# Patient Record
Sex: Female | Born: 1970 | Race: Black or African American | Hispanic: No | Marital: Married | Smoking: Never smoker
Health system: Southern US, Community
[De-identification: ages and names within clinical notes are randomized; demographics above are authoritative.]

## PROBLEM LIST (undated history)

## (undated) DIAGNOSIS — R768 Other specified abnormal immunological findings in serum: Secondary | ICD-10-CM

## (undated) DIAGNOSIS — M2550 Pain in unspecified joint: Secondary | ICD-10-CM

## (undated) DIAGNOSIS — I7300 Raynaud's syndrome without gangrene: Secondary | ICD-10-CM

## (undated) DIAGNOSIS — F988 Other specified behavioral and emotional disorders with onset usually occurring in childhood and adolescence: Secondary | ICD-10-CM

## (undated) DIAGNOSIS — L719 Rosacea, unspecified: Secondary | ICD-10-CM

## (undated) DIAGNOSIS — Z86718 Personal history of other venous thrombosis and embolism: Secondary | ICD-10-CM

## (undated) DIAGNOSIS — D249 Benign neoplasm of unspecified breast: Secondary | ICD-10-CM

## (undated) DIAGNOSIS — F32A Depression, unspecified: Secondary | ICD-10-CM

## (undated) DIAGNOSIS — M549 Dorsalgia, unspecified: Secondary | ICD-10-CM

## (undated) DIAGNOSIS — M797 Fibromyalgia: Secondary | ICD-10-CM

## (undated) DIAGNOSIS — D689 Coagulation defect, unspecified: Secondary | ICD-10-CM

## (undated) DIAGNOSIS — E559 Vitamin D deficiency, unspecified: Secondary | ICD-10-CM

## (undated) DIAGNOSIS — D6861 Antiphospholipid syndrome: Secondary | ICD-10-CM

## (undated) DIAGNOSIS — Z8619 Personal history of other infectious and parasitic diseases: Secondary | ICD-10-CM

## (undated) DIAGNOSIS — O99119 Other diseases of the blood and blood-forming organs and certain disorders involving the immune mechanism complicating pregnancy, unspecified trimester: Secondary | ICD-10-CM

## (undated) DIAGNOSIS — F329 Major depressive disorder, single episode, unspecified: Secondary | ICD-10-CM

## (undated) DIAGNOSIS — R5383 Other fatigue: Secondary | ICD-10-CM

## (undated) DIAGNOSIS — N870 Mild cervical dysplasia: Secondary | ICD-10-CM

## (undated) DIAGNOSIS — K219 Gastro-esophageal reflux disease without esophagitis: Secondary | ICD-10-CM

## (undated) HISTORY — DX: Mild cervical dysplasia: N87.0

## (undated) HISTORY — PX: GYNECOLOGIC CRYOSURGERY: SHX857

## (undated) HISTORY — PX: OTHER SURGICAL HISTORY: SHX169

## (undated) HISTORY — DX: Rosacea, unspecified: L71.9

## (undated) HISTORY — DX: Vitamin D deficiency, unspecified: E55.9

## (undated) HISTORY — DX: Personal history of other infectious and parasitic diseases: Z86.19

## (undated) HISTORY — DX: Raynaud's syndrome without gangrene: I73.00

## (undated) HISTORY — DX: Coagulation defect, unspecified: D68.9

## (undated) HISTORY — DX: Depression, unspecified: F32.A

## (undated) HISTORY — DX: Benign neoplasm of unspecified breast: D24.9

## (undated) HISTORY — DX: Dorsalgia, unspecified: M54.9

## (undated) HISTORY — DX: Other fatigue: R53.83

## (undated) HISTORY — DX: Other diseases of the blood and blood-forming organs and certain disorders involving the immune mechanism complicating pregnancy, unspecified trimester: O99.119

## (undated) HISTORY — DX: Other specified abnormal immunological findings in serum: R76.8

## (undated) HISTORY — DX: Personal history of other venous thrombosis and embolism: Z86.718

## (undated) HISTORY — DX: Gastro-esophageal reflux disease without esophagitis: K21.9

## (undated) HISTORY — DX: Major depressive disorder, single episode, unspecified: F32.9

## (undated) HISTORY — DX: Other specified behavioral and emotional disorders with onset usually occurring in childhood and adolescence: F98.8

## (undated) HISTORY — DX: Pain in unspecified joint: M25.50

## (undated) HISTORY — DX: Fibromyalgia: M79.7

## (undated) HISTORY — DX: Antiphospholipid syndrome: D68.61

---

## 1998-11-12 ENCOUNTER — Other Ambulatory Visit: Admission: RE | Admit: 1998-11-12 | Discharge: 1998-11-12 | Payer: Self-pay | Admitting: *Deleted

## 1999-02-05 ENCOUNTER — Inpatient Hospital Stay (HOSPITAL_COMMUNITY): Admission: AD | Admit: 1999-02-05 | Discharge: 1999-02-05 | Payer: Self-pay | Admitting: Obstetrics & Gynecology

## 1999-09-16 ENCOUNTER — Encounter (INDEPENDENT_AMBULATORY_CARE_PROVIDER_SITE_OTHER): Payer: Self-pay | Admitting: Specialist

## 1999-09-16 ENCOUNTER — Ambulatory Visit (HOSPITAL_COMMUNITY): Admission: RE | Admit: 1999-09-16 | Discharge: 1999-09-16 | Payer: Self-pay | Admitting: Surgery

## 2000-08-01 ENCOUNTER — Other Ambulatory Visit: Admission: RE | Admit: 2000-08-01 | Discharge: 2000-08-01 | Payer: Self-pay | Admitting: Obstetrics & Gynecology

## 2002-07-16 ENCOUNTER — Other Ambulatory Visit: Admission: RE | Admit: 2002-07-16 | Discharge: 2002-07-16 | Payer: Self-pay | Admitting: Obstetrics and Gynecology

## 2004-01-13 ENCOUNTER — Other Ambulatory Visit: Admission: RE | Admit: 2004-01-13 | Discharge: 2004-01-13 | Payer: Self-pay | Admitting: Obstetrics and Gynecology

## 2005-08-06 ENCOUNTER — Inpatient Hospital Stay (HOSPITAL_COMMUNITY): Admission: AD | Admit: 2005-08-06 | Discharge: 2005-08-06 | Payer: Self-pay | Admitting: *Deleted

## 2005-08-09 ENCOUNTER — Inpatient Hospital Stay (HOSPITAL_COMMUNITY): Admission: AD | Admit: 2005-08-09 | Discharge: 2005-08-09 | Payer: Self-pay | Admitting: Obstetrics and Gynecology

## 2005-08-15 ENCOUNTER — Ambulatory Visit (HOSPITAL_COMMUNITY): Admission: RE | Admit: 2005-08-15 | Discharge: 2005-08-15 | Payer: Self-pay | Admitting: Obstetrics and Gynecology

## 2005-08-15 ENCOUNTER — Encounter (INDEPENDENT_AMBULATORY_CARE_PROVIDER_SITE_OTHER): Payer: Self-pay | Admitting: *Deleted

## 2006-04-10 ENCOUNTER — Ambulatory Visit (HOSPITAL_COMMUNITY): Admission: RE | Admit: 2006-04-10 | Discharge: 2006-04-10 | Payer: Self-pay | Admitting: Family Medicine

## 2006-04-10 ENCOUNTER — Encounter: Payer: Self-pay | Admitting: Vascular Surgery

## 2006-05-21 ENCOUNTER — Other Ambulatory Visit: Admission: RE | Admit: 2006-05-21 | Discharge: 2006-05-21 | Payer: Self-pay | Admitting: Gynecology

## 2007-09-11 ENCOUNTER — Other Ambulatory Visit: Admission: RE | Admit: 2007-09-11 | Discharge: 2007-09-11 | Payer: Self-pay | Admitting: Gynecology

## 2007-09-26 DIAGNOSIS — N870 Mild cervical dysplasia: Secondary | ICD-10-CM

## 2007-09-26 HISTORY — DX: Mild cervical dysplasia: N87.0

## 2008-01-31 ENCOUNTER — Other Ambulatory Visit: Admission: RE | Admit: 2008-01-31 | Discharge: 2008-01-31 | Payer: Self-pay | Admitting: Gynecology

## 2008-05-22 ENCOUNTER — Ambulatory Visit (HOSPITAL_BASED_OUTPATIENT_CLINIC_OR_DEPARTMENT_OTHER): Admission: RE | Admit: 2008-05-22 | Discharge: 2008-05-22 | Payer: Self-pay | Admitting: Obstetrics and Gynecology

## 2008-05-22 ENCOUNTER — Encounter: Payer: Self-pay | Admitting: Obstetrics and Gynecology

## 2008-12-28 ENCOUNTER — Ambulatory Visit: Payer: Self-pay | Admitting: Obstetrics and Gynecology

## 2008-12-31 ENCOUNTER — Encounter: Payer: Self-pay | Admitting: Obstetrics and Gynecology

## 2008-12-31 ENCOUNTER — Other Ambulatory Visit: Admission: RE | Admit: 2008-12-31 | Discharge: 2008-12-31 | Payer: Self-pay | Admitting: Obstetrics and Gynecology

## 2008-12-31 ENCOUNTER — Ambulatory Visit: Payer: Self-pay | Admitting: Obstetrics and Gynecology

## 2009-01-15 ENCOUNTER — Ambulatory Visit: Payer: Self-pay | Admitting: Obstetrics and Gynecology

## 2009-05-18 ENCOUNTER — Ambulatory Visit: Payer: Self-pay | Admitting: Obstetrics and Gynecology

## 2009-07-23 ENCOUNTER — Ambulatory Visit: Payer: Self-pay | Admitting: Obstetrics and Gynecology

## 2009-08-02 ENCOUNTER — Ambulatory Visit: Payer: Self-pay | Admitting: Obstetrics and Gynecology

## 2009-08-16 ENCOUNTER — Ambulatory Visit: Payer: Self-pay | Admitting: Obstetrics and Gynecology

## 2009-08-27 ENCOUNTER — Ambulatory Visit: Payer: Self-pay | Admitting: Obstetrics and Gynecology

## 2009-09-18 ENCOUNTER — Ambulatory Visit: Payer: Self-pay | Admitting: Obstetrics and Gynecology

## 2009-09-28 ENCOUNTER — Ambulatory Visit: Payer: Self-pay | Admitting: Obstetrics and Gynecology

## 2009-10-07 ENCOUNTER — Ambulatory Visit: Payer: Self-pay | Admitting: Obstetrics and Gynecology

## 2009-10-07 ENCOUNTER — Ambulatory Visit (HOSPITAL_COMMUNITY): Admission: RE | Admit: 2009-10-07 | Discharge: 2009-10-07 | Payer: Self-pay | Admitting: Obstetrics and Gynecology

## 2009-11-06 ENCOUNTER — Ambulatory Visit: Payer: Self-pay | Admitting: Obstetrics and Gynecology

## 2009-11-09 ENCOUNTER — Ambulatory Visit: Payer: Self-pay | Admitting: Obstetrics and Gynecology

## 2009-12-30 ENCOUNTER — Ambulatory Visit: Payer: Self-pay | Admitting: Obstetrics and Gynecology

## 2010-01-02 ENCOUNTER — Ambulatory Visit: Payer: Self-pay | Admitting: Obstetrics and Gynecology

## 2010-02-15 ENCOUNTER — Ambulatory Visit: Payer: Self-pay | Admitting: Obstetrics and Gynecology

## 2010-02-17 ENCOUNTER — Ambulatory Visit: Payer: Self-pay | Admitting: Obstetrics and Gynecology

## 2010-02-24 ENCOUNTER — Ambulatory Visit: Payer: Self-pay | Admitting: Obstetrics and Gynecology

## 2010-02-24 ENCOUNTER — Other Ambulatory Visit: Admission: RE | Admit: 2010-02-24 | Discharge: 2010-02-24 | Payer: Self-pay | Admitting: Obstetrics and Gynecology

## 2010-03-02 ENCOUNTER — Ambulatory Visit: Payer: Self-pay | Admitting: Obstetrics and Gynecology

## 2010-03-04 ENCOUNTER — Ambulatory Visit (HOSPITAL_COMMUNITY): Admission: RE | Admit: 2010-03-04 | Discharge: 2010-03-04 | Payer: Self-pay | Admitting: Obstetrics and Gynecology

## 2010-03-04 ENCOUNTER — Ambulatory Visit: Payer: Self-pay | Admitting: Obstetrics and Gynecology

## 2010-03-08 ENCOUNTER — Ambulatory Visit: Payer: Self-pay | Admitting: Obstetrics and Gynecology

## 2010-10-12 ENCOUNTER — Inpatient Hospital Stay (HOSPITAL_COMMUNITY): Admission: RE | Admit: 2010-10-12 | Discharge: 2010-10-14 | Payer: Self-pay | Admitting: Obstetrics and Gynecology

## 2010-10-14 ENCOUNTER — Encounter
Admission: RE | Admit: 2010-10-14 | Discharge: 2010-11-13 | Payer: Self-pay | Source: Home / Self Care | Admitting: Obstetrics and Gynecology

## 2010-10-18 ENCOUNTER — Ambulatory Visit: Admission: RE | Admit: 2010-10-18 | Discharge: 2010-10-18 | Payer: Self-pay | Admitting: Obstetrics and Gynecology

## 2010-10-20 ENCOUNTER — Ambulatory Visit: Payer: Self-pay | Admitting: Oncology

## 2010-10-26 LAB — CBC WITH DIFFERENTIAL/PLATELET
Basophils Absolute: 0 10*3/uL (ref 0.0–0.1)
EOS%: 0.4 % (ref 0.0–7.0)
HCT: 46.6 % (ref 34.8–46.6)
MONO#: 0.5 10*3/uL (ref 0.1–0.9)
RBC: 5 10*6/uL (ref 3.70–5.45)
RDW: 14.2 % (ref 11.2–14.5)

## 2010-10-28 LAB — LUPUS ANTICOAGULANT PANEL
DRVVT: 46.9 secs — ABNORMAL HIGH (ref 36.2–44.3)
Lupus Anticoagulant: DETECTED — AB
PTT Lupus Anticoagulant: 52.1 secs — ABNORMAL HIGH (ref 30.0–45.6)
PTTLA Confirmation: 14.2 secs — ABNORMAL HIGH (ref <8.0)

## 2010-10-28 LAB — BETA-2 GLYCOPROTEIN ANTIBODIES
Beta-2-Glycoprotein I IgA: 6 A Units (ref <20)
Beta-2-Glycoprotein I IgM: 7 M Units (ref <20)

## 2010-10-28 LAB — COMPREHENSIVE METABOLIC PANEL
Albumin: 3.8 g/dL (ref 3.5–5.2)
CO2: 25 mEq/L (ref 19–32)
Calcium: 9.1 mg/dL (ref 8.4–10.5)
Chloride: 105 mEq/L (ref 96–112)
Creatinine, Ser: 0.86 mg/dL (ref 0.40–1.20)
Glucose, Bld: 80 mg/dL (ref 70–99)
Potassium: 4.2 mEq/L (ref 3.5–5.3)
Sodium: 140 mEq/L (ref 135–145)
Total Bilirubin: 1.1 mg/dL (ref 0.3–1.2)

## 2010-10-28 LAB — CARDIOLIPIN ANTIBODIES, IGG, IGM, IGA: Anticardiolipin IgM: 34 MPL U/mL — ABNORMAL HIGH (ref <11)

## 2010-11-14 ENCOUNTER — Encounter
Admission: RE | Admit: 2010-11-14 | Discharge: 2010-12-08 | Payer: Self-pay | Source: Home / Self Care | Attending: Obstetrics and Gynecology | Admitting: Obstetrics and Gynecology

## 2011-01-01 ENCOUNTER — Encounter: Payer: Self-pay | Admitting: Obstetrics and Gynecology

## 2011-01-05 ENCOUNTER — Ambulatory Visit: Payer: Self-pay | Admitting: Oncology

## 2011-02-15 ENCOUNTER — Encounter (HOSPITAL_BASED_OUTPATIENT_CLINIC_OR_DEPARTMENT_OTHER): Payer: 59 | Admitting: Oncology

## 2011-02-15 DIAGNOSIS — I7300 Raynaud's syndrome without gangrene: Secondary | ICD-10-CM

## 2011-02-15 DIAGNOSIS — D6859 Other primary thrombophilia: Secondary | ICD-10-CM

## 2011-02-15 DIAGNOSIS — Z7901 Long term (current) use of anticoagulants: Secondary | ICD-10-CM

## 2011-02-21 LAB — CBC
HCT: 32.2 % — ABNORMAL LOW (ref 36.0–46.0)
HCT: 34.6 % — ABNORMAL LOW (ref 36.0–46.0)
Hemoglobin: 11.8 g/dL — ABNORMAL LOW (ref 12.0–15.0)
MCH: 33.9 pg (ref 26.0–34.0)
MCHC: 34.8 g/dL (ref 30.0–36.0)
MCV: 98.9 fL (ref 78.0–100.0)
MCV: 99.3 fL (ref 78.0–100.0)
RDW: 14 % (ref 11.5–15.5)

## 2011-04-25 NOTE — Op Note (Signed)
NAMELORRIANN, HANSMANN                ACCOUNT NO.:  [id number]   MEDICAL RECORD NO.:  [id number]          PATIENT TYPE:  AMB   LOCATION:  [facility]                         FACILITY:  [facility]   PHYSICIAN:  Daniel L. Gottsegen, M.D.DATE OF BIRTH:  Aug 15, 1971   DATE OF PROCEDURE:  05/22/2008  DATE OF DISCHARGE:                               OPERATIVE REPORT   PREOPERATIVE DIAGNOSIS:  Missed abortion.   POSTOPERATIVE DIAGNOSIS:  Missed abortion   OPERATION:  Suction curettage.   ANESTHESIA:  General.   SURGEON:  Daniel L. Eda Paschal, M.D.   INDICATIONS:  The patient is a 40 year old gravida 6, para 2, AB 3 who  had presented to the office and at 6 weeks and 3 days had an ultrasound  showing a normal viable intrauterine pregnancy.  She, however, returned  to the office this week for follow-up ultrasound and at this point the  pregnancy had stopped growing.  It had actually gotten smaller and it  was no longer viable without fetal heart activity.  As a result of this  she enters the hospital now for missed abortion suction curettage, as  well as for chromosome studies on the fetal tissue.   FINDINGS:  External is normal.  BUS is normal.  Vaginal is normal.  Cervix is clean.  Uterus is enlarged about 9 weeks by an intrauterine  pregnancy.  Adnexa failed to reveal masses.  At the time of suction  curettage there was evidence of an intrauterine pregnancy.   PROCEDURE:  After adequate general anesthesia the patient was placed in  the dorsal lithotomy position.  She was prepped and draped in the usual  sterile manner.  The cervix was grasped with a single-tooth tenaculum  and dilated to #31 Pratt dilator.  A suction curettage was then done  with a #9 curette with removal of the intrauterine pregnancy which was  nonviable.  A representative piece of tissue which appeared to be fetal  was separated from the specimen and was sent for chromosome.  The rest  of the specimen was sent to pathology.   There was no significant  bleeding afterwards.  The uterus contracted well.  The patient left the  operating room in satisfactory condition.      Daniel L. Eda Paschal, M.D.  Electronically Signed     DLG/MEDQ  D:  05/22/2008  T:  05/22/2008  Job:  [id number]

## 2011-04-28 NOTE — Op Note (Signed)
NAMELAIBA, FUERTE              ACCOUNT NO.:  [id number]   MEDICAL RECORD NO.:  [id number]          PATIENT TYPE:  AMB   LOCATION:  [facility]                           FACILITY:  [facility]   PHYSICIAN:  Osborn Coho, M.D.   DATE OF BIRTH:  1971/01/21   DATE OF PROCEDURE:  08/15/2005  DATE OF DISCHARGE:                                 OPERATIVE REPORT   PREOPERATIVE DIAGNOSIS:  Ectopic versus abnormal intrauterine pregnancy.   POSTOPERATIVE DIAGNOSIS:  Ectopic versus abnormal intrauterine pregnancy.   PROCEDURE:  Suction dilation and curettage.   ATTENDING:  Osborn Coho, M.D.   FLUIDS:  1000 mL.   URINE OUTPUT:  Approximately 200 mL via straight cath prior to procedure.   ESTIMATED BLOOD LOSS:  Minimal.   ANESTHESIA:  MAC.   SPECIMENS TO PATHOLOGY:  Probable POC's.   COMPLICATIONS:  None.   FINDINGS:  Uterus sounded to 8 cm.   DESCRIPTION OF PROCEDURE:  The patient was taken to the operating room after  the risks, benefits and alternatives were reviewed with the patient. The  patient verbalized understanding and consent signed and witnessed. The  patient was placed under MAC per anesthesia and prepped and draped in the  normal sterile fashion in the dorsal lithotomy position. A bivalve speculum  was placed in the patient's vagina and the anterior lip of the cervix  grasped with a single-tooth tenaculum. A paracervical block was administered  using a total of 10 mL of 2% lidocaine. The cervix was dilated for passage  of a size 7 suction curette. Suction curettage was performed and products  sent to pathology. Sharp curettage was then performed until a gritty texture  was noted. Suction curettage was performed once again to remove any  remaining debris. Count was correct. All instruments were removed and there  was hemostasis at the tenaculum site. The patient tolerated the procedure  well and was transferred to the recovery room in good condition.     Osborn Coho, M.D.  Electronically Signed    AR/MEDQ  D:  08/15/2005  T:  08/15/2005  Job:  [id number]

## 2011-07-28 ENCOUNTER — Encounter: Payer: Self-pay | Admitting: *Deleted

## 2011-07-28 ENCOUNTER — Encounter: Payer: Self-pay | Admitting: Gynecology

## 2011-07-28 ENCOUNTER — Ambulatory Visit (INDEPENDENT_AMBULATORY_CARE_PROVIDER_SITE_OTHER): Payer: 59 | Admitting: Gynecology

## 2011-07-28 DIAGNOSIS — N870 Mild cervical dysplasia: Secondary | ICD-10-CM | POA: Insufficient documentation

## 2011-07-28 DIAGNOSIS — L719 Rosacea, unspecified: Secondary | ICD-10-CM | POA: Insufficient documentation

## 2011-07-28 DIAGNOSIS — M797 Fibromyalgia: Secondary | ICD-10-CM | POA: Insufficient documentation

## 2011-07-28 DIAGNOSIS — I7300 Raynaud's syndrome without gangrene: Secondary | ICD-10-CM | POA: Insufficient documentation

## 2011-07-28 DIAGNOSIS — N63 Unspecified lump in unspecified breast: Secondary | ICD-10-CM

## 2011-07-28 DIAGNOSIS — R768 Other specified abnormal immunological findings in serum: Secondary | ICD-10-CM | POA: Insufficient documentation

## 2011-07-28 NOTE — Progress Notes (Signed)
Patient presents complaining of a right breast mass over the past 3 weeks or so. It is nontender. 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 She is status post birth of her child 9 months ago and breast fed for 6 weeks. She is on Micronor oral contraceptives now. Exam Breasts: Examined lying and sitting. Left without masses retractions discharge adenopathy. Right without masses retractions discharge adenopathy. The area that the patient is pointing to is lateral to the scar from her fibroadenoma several fingerbreadths off the areola at the 10:00 position. I do not feel any abnormalities but normal underlying breast tissue. Assessment and plan: #1 Breast mass. Patient feels with she thinks is a new breast mass I do not appreciate the change. Recommended baseline mammogram diagnostic bilateral as she is turning 41 and right breast ultrasound over this area. We will then triaged based on these results.  Patient knows that my office will schedule the tests and if she does not hear from my office within the week she will call [place]. #2 Birth control. I reviewed the issues of Micronor progesterone only contraceptive.  The need to take them every day around the same time and the increased failure risk. The issues of breakthrough bleeding was also discussed. 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 The issues of increased thrombosis risk potentially with combination oral contraceptives was reviewed such as stroke heart attack DVT. I think she would be a good candidate for Mirena IUD I encouraged her to consider this. She will think of her options and followup when she's due for her annual several months to rediscuss with Dr. Eda Paschal or followup sooner if she decides she wants to proceed with the Mirena.

## 2011-08-08 ENCOUNTER — Ambulatory Visit
Admission: RE | Admit: 2011-08-08 | Discharge: 2011-08-08 | Disposition: A | Discharge status: Home / Self Care | Payer: 59 | Source: Ambulatory Visit | Attending: Gynecology | Admitting: Gynecology

## 2011-08-08 DIAGNOSIS — N63 Unspecified lump in unspecified breast: Secondary | ICD-10-CM

## 2011-09-07 LAB — POCT I-STAT 4, (NA,K, GLUC, HGB,HCT): Potassium: 3.4 — ABNORMAL LOW

## 2011-12-12 NOTE — L&D Delivery Note (Signed)
Delivery Note At 4:23 PM a viable and healthy female was delivered via  (Presentation: ;LOA  ).  APGAR: 9,9 ; weight pending .   Placenta status: spontaneous, intact.  Cord:  with the following complications: shoulder cord x one .  Cord pH: na  Anesthesia: Epidural  Episiotomy: none  Lacerations: none Suture Repair: none Est. Blood Loss (mL): 300  Mom to postpartum.  Baby to nursery-stable.  Aidyn Sportsman J 11/14/2012, 4:40 PM

## 2012-04-23 LAB — OB RESULTS CONSOLE GC/CHLAMYDIA
Chlamydia: NEGATIVE
Gonorrhea: NEGATIVE

## 2012-04-23 LAB — OB RESULTS CONSOLE RPR: RPR: NONREACTIVE

## 2012-04-23 LAB — OB RESULTS CONSOLE HEPATITIS B SURFACE ANTIGEN: Hepatitis B Surface Ag: NEGATIVE

## 2012-04-23 LAB — OB RESULTS CONSOLE RUBELLA ANTIBODY, IGM: Rubella: IMMUNE

## 2012-04-23 LAB — OB RESULTS CONSOLE HIV ANTIBODY (ROUTINE TESTING): HIV: NONREACTIVE

## 2012-04-23 LAB — OB RESULTS CONSOLE ANTIBODY SCREEN: Antibody Screen: NEGATIVE

## 2012-05-29 ENCOUNTER — Ambulatory Visit (INDEPENDENT_AMBULATORY_CARE_PROVIDER_SITE_OTHER): Payer: 59 | Admitting: Physician Assistant

## 2012-05-29 VITALS — BP 111/68 | HR 74 | Temp 97.8°F | Resp 16

## 2012-05-29 DIAGNOSIS — H109 Unspecified conjunctivitis: Secondary | ICD-10-CM

## 2012-05-29 MED ORDER — CIPROFLOXACIN HCL 0.3 % OP SOLN: 1.0000 [drp] | OPHTHALMIC | Status: AC

## 2012-05-29 NOTE — Progress Notes (Signed)
  Subjective:    Patient ID: Molly Sanchez, female    DOB: 07/30/1971, 41 y.o.   MRN: [id number]  HPI  Awoke this morning with redness and crusting of the right eye.  She describes the sensation of "trash" in her eye, but without any history of FB to the eye.  Does not wear corrective lenses.  Her toddler son has has bilateral eye redness for the past week, which she thought was allergy, so has been using allergy drops for him.  Green drainage reaccummulates after cleaning her eye.  She is pregnant.  Review of Systems As above. No fever, chills.  No URI-type symptoms.    Objective:   Physical Exam Vital signs noted. Well-developed, well nourished BF who is awake, alert and oriented, in NAD. HEENT: [place]/AT, PERRL, EOMI.  Sclera and conjunctiva are clear on the left, injected on the right. Fundi are normal.  Small amount of greenish drainage at the medial canthus of the right eye.  Neck: supple, non-tender, no lymphadenopathy, thyromegaly. Skin: warm and dry without rash.      Assessment & Plan:   1. Conjunctivitis  ciprofloxacin (CILOXAN) 0.3 % ophthalmic solution   Anticipatory guidance provided.  Advised on methods to reduce spread of infection to others in her family.

## 2012-05-29 NOTE — Patient Instructions (Signed)
Conjunctivitis Conjunctivitis is commonly called "pink eye." Conjunctivitis can be caused by bacterial or viral infection, allergies, or injuries. There is usually redness of the lining of the eye, itching, discomfort, and sometimes discharge. There may be deposits of matter along the eyelids. A viral infection usually causes a watery discharge, while a bacterial infection causes a yellowish, thick discharge. Pink eye is very contagious and spreads by direct contact. You may be given antibiotic eyedrops as part of your treatment. Before using your eye medicine, remove all drainage from the eye by washing gently with warm water and cotton balls. Continue to use the medication until you have awakened 2 mornings in a row without discharge from the eye. Do not rub your eye. This increases the irritation and helps spread infection. Use separate towels from other household members. Wash your hands with soap and water before and after touching your eyes. Use cold compresses to reduce pain and sunglasses to relieve irritation from light. Do not wear contact lenses or wear eye makeup until the infection is gone. SEEK MEDICAL CARE IF:   Your symptoms are not better after 3 days of treatment.   You have increased pain or trouble seeing.   The outer eyelids become very red or swollen.  Document Released: 01/04/2005 Document Revised: 11/16/2011 Document Reviewed: 11/27/2005 [employer] Patient Information 2012 [employer]. 

## 2012-07-31 ENCOUNTER — Ambulatory Visit (INDEPENDENT_AMBULATORY_CARE_PROVIDER_SITE_OTHER): Payer: 59 | Admitting: Physician Assistant

## 2012-07-31 ENCOUNTER — Encounter: Payer: Self-pay | Admitting: Physician Assistant

## 2012-07-31 VITALS — BP 110/60 | HR 90 | Temp 98.1°F | Resp 16 | Ht 67.0 in

## 2012-07-31 DIAGNOSIS — B350 Tinea barbae and tinea capitis: Secondary | ICD-10-CM

## 2012-07-31 LAB — POCT SKIN KOH: Skin KOH, POC: POSITIVE

## 2012-07-31 MED ORDER — CLOTRIMAZOLE-BETAMETHASONE 1-0.05 % EX CREA: TOPICAL_CREAM | Freq: Two times a day (BID) | CUTANEOUS | Status: DC

## 2012-07-31 NOTE — Progress Notes (Signed)
  Subjective:    Patient ID: Molly Sanchez, female    DOB: March 06, 1971, 41 y.o.   MRN: [id number]  HPI 41 yr old pregnant AAF presents with pruritic rash at base of scalp X 1 week.  Worsening.  No new soaps, creams, lotions, or creams.  No metal/nickel hair accessories. Healthy pregnancy so far.  Review of Systems  All other systems reviewed and are negative.      Objective:   Physical Exam  Nursing note and vitals reviewed. Constitutional: She is oriented to person, place, and time. She appears well-developed and well-nourished.  HENT:  Head: Normocephalic and atraumatic.  Cardiovascular: Normal rate, regular rhythm and normal heart sounds.   Pulmonary/Chest: Effort normal and breath sounds normal.  Neurological: She is alert and oriented to person, place, and time.  Skin: Skin is dry. Rash noted.       Base of neck and into lower hairline-1 cm round, well demarcated plaque with scale becoming confluent with other lesions that are spreading further up scalp. No noticeable hair loss at this time.    Results for orders placed in visit on 07/31/12  POCT SKIN KOH      Component Value Range   Skin KOH, POC Positive        Assessment & Plan:  Tinea capitus-unable to treat systemically due to pregnancy.  Use extra-strength selsun blue shampoo every 3rd day.  Use no-break hair accessories.  Start lotrisone cream bid.  Will treat 4 weeks instead of 2.  We will look at it again in 2 -3 weeks. (also seen and examined by Dr. Merla Riches and decided treatment together.)

## 2012-10-24 LAB — OB RESULTS CONSOLE GBS: GBS: POSITIVE

## 2012-11-01 ENCOUNTER — Inpatient Hospital Stay (HOSPITAL_COMMUNITY): Admission: AD | Admit: 2012-11-01 | Payer: Self-pay | Source: Ambulatory Visit | Admitting: Obstetrics and Gynecology

## 2012-11-01 ENCOUNTER — Other Ambulatory Visit: Payer: Self-pay | Admitting: Obstetrics and Gynecology

## 2012-11-04 ENCOUNTER — Encounter (HOSPITAL_COMMUNITY): Payer: Self-pay | Admitting: *Deleted

## 2012-11-04 ENCOUNTER — Telehealth (HOSPITAL_COMMUNITY): Payer: Self-pay | Admitting: *Deleted

## 2012-11-04 NOTE — Telephone Encounter (Signed)
Preadmission screen  

## 2012-11-14 ENCOUNTER — Encounter (HOSPITAL_COMMUNITY): Payer: Self-pay

## 2012-11-14 ENCOUNTER — Inpatient Hospital Stay (HOSPITAL_COMMUNITY): Payer: 59 | Admitting: Anesthesiology

## 2012-11-14 ENCOUNTER — Encounter (HOSPITAL_COMMUNITY): Payer: Self-pay | Admitting: Anesthesiology

## 2012-11-14 ENCOUNTER — Inpatient Hospital Stay (HOSPITAL_COMMUNITY)
Admission: RE | Admit: 2012-11-14 | Discharge: 2012-11-16 | DRG: 775 | Disposition: A | Discharge status: Home / Self Care | Payer: 59 | Source: Ambulatory Visit | Attending: Obstetrics and Gynecology | Admitting: Obstetrics and Gynecology

## 2012-11-14 DIAGNOSIS — D649 Anemia, unspecified: Secondary | ICD-10-CM | POA: Diagnosis not present

## 2012-11-14 DIAGNOSIS — O34219 Maternal care for unspecified type scar from previous cesarean delivery: Principal | ICD-10-CM | POA: Diagnosis present

## 2012-11-14 DIAGNOSIS — O09529 Supervision of elderly multigravida, unspecified trimester: Secondary | ICD-10-CM | POA: Diagnosis present

## 2012-11-14 DIAGNOSIS — O9903 Anemia complicating the puerperium: Secondary | ICD-10-CM | POA: Diagnosis not present

## 2012-11-14 DIAGNOSIS — O9912 Other diseases of the blood and blood-forming organs and certain disorders involving the immune mechanism complicating childbirth: Secondary | ICD-10-CM | POA: Diagnosis present

## 2012-11-14 DIAGNOSIS — D6861 Antiphospholipid syndrome: Secondary | ICD-10-CM

## 2012-11-14 DIAGNOSIS — D689 Coagulation defect, unspecified: Secondary | ICD-10-CM | POA: Diagnosis present

## 2012-11-14 DIAGNOSIS — O365990 Maternal care for other known or suspected poor fetal growth, unspecified trimester, not applicable or unspecified: Secondary | ICD-10-CM | POA: Diagnosis present

## 2012-11-14 HISTORY — DX: Antiphospholipid syndrome: D68.61

## 2012-11-14 LAB — CBC
HCT: 33.4 % — ABNORMAL LOW (ref 36.0–46.0)
MCH: 29.2 pg (ref 26.0–34.0)
MCHC: 33.5 g/dL (ref 30.0–36.0)
MCV: 87.2 fL (ref 78.0–100.0)
RDW: 14.5 % (ref 11.5–15.5)
WBC: 10.6 10*3/uL — ABNORMAL HIGH (ref 4.0–10.5)

## 2012-11-14 MED ORDER — LACTATED RINGERS IV SOLN: 500.0000 mL | Freq: Once | INTRAVENOUS | Status: DC

## 2012-11-14 MED ORDER — TERBUTALINE SULFATE 1 MG/ML IJ SOLN: 0.2500 mg | Freq: Once | INTRAMUSCULAR | Status: DC | PRN

## 2012-11-14 MED ORDER — EPHEDRINE 5 MG/ML INJ
10.0000 mg | INTRAVENOUS | Status: DC | PRN
  Filled 2012-11-14: qty 4

## 2012-11-14 MED ORDER — DIBUCAINE 1 % RE OINT: 1.0000 "application " | TOPICAL_OINTMENT | RECTAL | Status: DC | PRN

## 2012-11-14 MED ORDER — SODIUM BICARBONATE 8.4 % IV SOLN
INTRAVENOUS | Status: DC | PRN
  Administered 2012-11-14: 5 mL via EPIDURAL

## 2012-11-14 MED ORDER — PHENYLEPHRINE 40 MCG/ML (10ML) SYRINGE FOR IV PUSH (FOR BLOOD PRESSURE SUPPORT): 80.0000 ug | PREFILLED_SYRINGE | INTRAVENOUS | Status: DC | PRN

## 2012-11-14 MED ORDER — LANOLIN HYDROUS EX OINT: TOPICAL_OINTMENT | CUTANEOUS | Status: DC | PRN

## 2012-11-14 MED ORDER — CITRIC ACID-SODIUM CITRATE 334-500 MG/5ML PO SOLN: 30.0000 mL | ORAL | Status: DC | PRN

## 2012-11-14 MED ORDER — FENTANYL 2.5 MCG/ML BUPIVACAINE 1/10 % EPIDURAL INFUSION (WH - ANES)
14.0000 mL/h | INTRAMUSCULAR | Status: DC
  Administered 2012-11-14: 14 mL/h via EPIDURAL
  Filled 2012-11-14: qty 125

## 2012-11-14 MED ORDER — DEXTROSE 5 % IV SOLN
2.5000 10*6.[IU] | INTRAVENOUS | Status: DC
  Administered 2012-11-14: 2.5 10*6.[IU] via INTRAVENOUS
  Filled 2012-11-14 (×5): qty 2.5

## 2012-11-14 MED ORDER — ONDANSETRON HCL 4 MG PO TABS: 4.0000 mg | ORAL_TABLET | ORAL | Status: DC | PRN

## 2012-11-14 MED ORDER — METHYLERGONOVINE MALEATE 0.2 MG/ML IJ SOLN: 0.2000 mg | INTRAMUSCULAR | Status: DC | PRN

## 2012-11-14 MED ORDER — PRENATAL MULTIVITAMIN CH
1.0000 | ORAL_TABLET | Freq: Every day | ORAL | Status: DC
  Administered 2012-11-14 – 2012-11-16 (×3): 1 via ORAL
  Filled 2012-11-14 (×3): qty 1

## 2012-11-14 MED ORDER — ACETAMINOPHEN 325 MG PO TABS: 650.0000 mg | ORAL_TABLET | ORAL | Status: DC | PRN

## 2012-11-14 MED ORDER — FLEET ENEMA 7-19 GM/118ML RE ENEM: 1.0000 | ENEMA | RECTAL | Status: DC | PRN

## 2012-11-14 MED ORDER — PHENYLEPHRINE 40 MCG/ML (10ML) SYRINGE FOR IV PUSH (FOR BLOOD PRESSURE SUPPORT)
80.0000 ug | PREFILLED_SYRINGE | INTRAVENOUS | Status: DC | PRN
  Filled 2012-11-14: qty 5

## 2012-11-14 MED ORDER — TETANUS-DIPHTH-ACELL PERTUSSIS 5-2.5-18.5 LF-MCG/0.5 IM SUSP: 0.5000 mL | Freq: Once | INTRAMUSCULAR | Status: DC

## 2012-11-14 MED ORDER — DEXTROSE 5 % IV SOLN
5.0000 10*6.[IU] | Freq: Once | INTRAVENOUS | Status: AC
  Administered 2012-11-14: 5 10*6.[IU] via INTRAVENOUS
  Filled 2012-11-14: qty 5

## 2012-11-14 MED ORDER — ONDANSETRON HCL 4 MG/2ML IJ SOLN: 4.0000 mg | INTRAMUSCULAR | Status: DC | PRN

## 2012-11-14 MED ORDER — OXYCODONE-ACETAMINOPHEN 5-325 MG PO TABS
1.0000 | ORAL_TABLET | ORAL | Status: DC | PRN
  Administered 2012-11-15: 2 via ORAL
  Administered 2012-11-15 (×2): 1 via ORAL
  Filled 2012-11-14: qty 2
  Filled 2012-11-14 (×2): qty 1

## 2012-11-14 MED ORDER — SIMETHICONE 80 MG PO CHEW: 80.0000 mg | CHEWABLE_TABLET | ORAL | Status: DC | PRN

## 2012-11-14 MED ORDER — METHYLERGONOVINE MALEATE 0.2 MG PO TABS: 0.2000 mg | ORAL_TABLET | ORAL | Status: DC | PRN

## 2012-11-14 MED ORDER — OXYTOCIN 40 UNITS IN LACTATED RINGERS INFUSION - SIMPLE MED: 62.5000 mL/h | INTRAVENOUS | Status: DC

## 2012-11-14 MED ORDER — LACTATED RINGERS IV SOLN
INTRAVENOUS | Status: DC
  Administered 2012-11-14: 1000 mL via INTRAVENOUS
  Administered 2012-11-14: 950 mL via INTRAVENOUS

## 2012-11-14 MED ORDER — WITCH HAZEL-GLYCERIN EX PADS: 1.0000 "application " | MEDICATED_PAD | CUTANEOUS | Status: DC | PRN

## 2012-11-14 MED ORDER — IBUPROFEN 600 MG PO TABS
600.0000 mg | ORAL_TABLET | Freq: Four times a day (QID) | ORAL | Status: DC
  Administered 2012-11-14 – 2012-11-16 (×7): 600 mg via ORAL
  Filled 2012-11-14 (×6): qty 1

## 2012-11-14 MED ORDER — ZOLPIDEM TARTRATE 5 MG PO TABS: 5.0000 mg | ORAL_TABLET | Freq: Every evening | ORAL | Status: DC | PRN

## 2012-11-14 MED ORDER — SENNOSIDES-DOCUSATE SODIUM 8.6-50 MG PO TABS
2.0000 | ORAL_TABLET | Freq: Every day | ORAL | Status: DC
  Administered 2012-11-14: 2 via ORAL

## 2012-11-14 MED ORDER — DIPHENHYDRAMINE HCL 50 MG/ML IJ SOLN: 12.5000 mg | INTRAMUSCULAR | Status: DC | PRN

## 2012-11-14 MED ORDER — OXYCODONE-ACETAMINOPHEN 5-325 MG PO TABS: 1.0000 | ORAL_TABLET | ORAL | Status: DC | PRN

## 2012-11-14 MED ORDER — DIPHENHYDRAMINE HCL 25 MG PO CAPS: 25.0000 mg | ORAL_CAPSULE | Freq: Four times a day (QID) | ORAL | Status: DC | PRN

## 2012-11-14 MED ORDER — LACTATED RINGERS IV SOLN
500.0000 mL | INTRAVENOUS | Status: DC | PRN
  Administered 2012-11-14: 1000 mL via INTRAVENOUS

## 2012-11-14 MED ORDER — IBUPROFEN 600 MG PO TABS
600.0000 mg | ORAL_TABLET | Freq: Four times a day (QID) | ORAL | Status: DC | PRN
  Filled 2012-11-14: qty 1

## 2012-11-14 MED ORDER — OXYTOCIN BOLUS FROM INFUSION: 500.0000 mL | INTRAVENOUS | Status: DC

## 2012-11-14 MED ORDER — EPHEDRINE 5 MG/ML INJ: 10.0000 mg | INTRAVENOUS | Status: DC | PRN

## 2012-11-14 MED ORDER — ONDANSETRON HCL 4 MG/2ML IJ SOLN
4.0000 mg | Freq: Four times a day (QID) | INTRAMUSCULAR | Status: DC | PRN
  Administered 2012-11-14: 4 mg via INTRAVENOUS
  Filled 2012-11-14: qty 2

## 2012-11-14 MED ORDER — OXYTOCIN 40 UNITS IN LACTATED RINGERS INFUSION - SIMPLE MED
1.0000 m[IU]/min | INTRAVENOUS | Status: DC
  Administered 2012-11-14: 2 m[IU]/min via INTRAVENOUS
  Administered 2012-11-14: 600 m[IU]/min via INTRAVENOUS
  Filled 2012-11-14: qty 1000

## 2012-11-14 MED ORDER — BENZOCAINE-MENTHOL 20-0.5 % EX AERO
1.0000 "application " | INHALATION_SPRAY | CUTANEOUS | Status: DC | PRN
  Administered 2012-11-14: 1 via TOPICAL
  Filled 2012-11-14: qty 56

## 2012-11-14 MED ORDER — LIDOCAINE HCL (PF) 1 % IJ SOLN: 30.0000 mL | INTRAMUSCULAR | Status: DC | PRN

## 2012-11-14 NOTE — H&P (Signed)
Molly Sanchez, Molly Sanchez                ACCOUNT NO.:  [id number]  MEDICAL RECORD NO.:  [id number]  LOCATION:  9123                          FACILITY:  [facility]  PHYSICIAN:  Lenoard Aden, M.D.DATE OF BIRTH:  October 20, 1971  DATE OF ADMISSION:  11/14/2012 DATE OF DISCHARGE:                             HISTORY & PHYSICAL   CHIEF COMPLAINT:  A 38 weeks with history of IUGR and antiphospholipid antibody syndrome.  HISTORY OF PRESENT ILLNESS:  She is a 41 year old white African American female, G9, P3-1-4-3 with a history of C-section and successful VBAC x2, who presents with favorable cervix, induction of labor, and history of antiphospholipid antibody syndrome and IUGR for induction of labor at 38 weeks.  She has no known drug allergies.  MEDICATIONS:  Prenatal vitamins and Lovenox, which was discontinued on November 12, 2012.  SOCIAL HISTORY:  She has personal history of antiphospholipid antibody syndrome as noted.  FAMILY HISTORY:  Psychiatric disease and heart disease.  She has a previous history of a preterm birth at 22 weeks, a history of C-section at 41 weeks and a history of vaginal delivery of a 7 pounds and 11 ounces fetus and a 6 pounds fetus.  PHYSICAL EXAMINATION:  GENERAL:  She is a well-developed, well- nourished, African American female, in no acute distress. HEENT:  Normal. NECK:  Supple.  Full range of motion. LUNGS:  Clear. HEART:  Regular rhythm. ABDOMEN:  Soft, gravid, nontender.  Cervix is 2-3 cm, 70% vertex, -1. EXTREMITIES:  There are no cords. NEUROLOGIC:  Nonfocal. SKIN:  Intact.  IMPRESSION: 1. A 38 weeks intrauterine pregnancy. 2. History of intrauterine growth restriction. 3. Antiphospholipid antibody syndrome, previously on Lovenox. 4. Previous cesarean section was successful vaginal birth after     cesarean x2.  PLAN:  Proceed with induction of labor.  Risks and benefits were discussed.  Epidural as needed.     Lenoard Aden,  M.D.     RJT/MEDQ  D:  11/14/2012  T:  11/14/2012  Job:  [id number]

## 2012-11-14 NOTE — Anesthesia Procedure Notes (Signed)

## 2012-11-14 NOTE — Progress Notes (Signed)
Molly Sanchez is a 41 y.o. [id number] at [redacted]w[redacted]d by LMP admitted for induction of labor due to Antiphospholipid ab syndrome and presumed IUGR.  Subjective: Comfortable with epidural  Objective: BP 118/60  Pulse 67  Temp 98.2 F (36.8 C) (Oral)  Resp 20  Ht 5\' 7"  (1.702 m)  Wt 120.203 kg (265 lb)  BMI 41.50 kg/m2  SpO2 100%  LMP 02/22/2012      FHT:  FHR: 134 bpm, variability: moderate,  accelerations:  Present,  decelerations:  Absent UC:   irregular, IUPC placed without difficulty SVE:   Dilation: 5.5 Effacement (%): 80 Station: -2 Exam by:: Dr Billy Coast  Labs: Lab Results  Component Value Date   WBC 10.6* 11/14/2012   HGB 11.2* 11/14/2012   HCT 33.4* 11/14/2012   MCV 87.2 11/14/2012   PLT 164 11/14/2012    Assessment / Plan: Induction of labor due to APL AB syndrome,  progressing well on pitocin  Labor: Progressing normally Preeclampsia:  na Fetal Wellbeing:  Category I Pain Control:  Epidural I/D:  n/a Anticipated MOD:  NSVD  Berish Bohman J 11/14/2012, 3:03 PM

## 2012-11-14 NOTE — Progress Notes (Signed)
Molly Sanchez is a 41 y.o. [id number] at [redacted]w[redacted]d by LMP admitted for induction of labor due to Antiphospholipid Ab syndrome and IUGR.  Subjective: Comfortable  Objective: BP 107/59  Pulse 74  Temp 98.5 F (36.9 C) (Oral)  Resp 18  Ht 5\' 7"  (1.702 m)  Wt 120.203 kg (265 lb)  BMI 41.50 kg/m2  LMP 02/22/2012      FHT:  FHR: 155 bpm, variability: moderate,  accelerations:  Present,  decelerations:  Absent UC:   none SVE:    2/60/-1 AROM - clear Labs: CBC    Component Value Date/Time   WBC 10.6* 11/14/2012 0715   WBC 7.8 10/26/2010 1052   RBC 3.83* 11/14/2012 0715   RBC 5.00 10/26/2010 1052   HGB 11.2* 11/14/2012 0715   HGB 15.6 10/26/2010 1052   HCT 33.4* 11/14/2012 0715   HCT 46.6 10/26/2010 1052   PLT 164 11/14/2012 0715   PLT 220 10/26/2010 1052   MCV 87.2 11/14/2012 0715   MCV 93.3 10/26/2010 1052   MCH 29.2 11/14/2012 0715   MCH 31.3 10/26/2010 1052   MCHC 33.5 11/14/2012 0715   MCHC 33.5 10/26/2010 1052   RDW 14.5 11/14/2012 0715   RDW 14.2 10/26/2010 1052   LYMPHSABS 1.0 10/26/2010 1052   MONOABS 0.5 10/26/2010 1052   EOSABS 0.0 10/26/2010 1052   BASOSABS 0.0 10/26/2010 1052      Assessment / Plan: Induction of labor due to above indications,  progressing well on pitocin  Labor: Progressing on Pitocin, will continue to increase then AROM Preeclampsia:  na Fetal Wellbeing:  Category I Pain Control:  Labor support without medications I/D:  n/a Anticipated MOD:  NSVD  Molly Sanchez J 11/14/2012, 7:19 AM

## 2012-11-14 NOTE — Anesthesia Preprocedure Evaluation (Addendum)

## 2012-11-15 ENCOUNTER — Encounter (HOSPITAL_COMMUNITY): Payer: Self-pay

## 2012-11-15 DIAGNOSIS — D6861 Antiphospholipid syndrome: Secondary | ICD-10-CM

## 2012-11-15 HISTORY — DX: Antiphospholipid syndrome: D68.61

## 2012-11-15 LAB — CBC
HCT: 30.3 % — ABNORMAL LOW (ref 36.0–46.0)
MCH: 29.9 pg (ref 26.0–34.0)
MCHC: 34 g/dL (ref 30.0–36.0)
MCV: 87.8 fL (ref 78.0–100.0)
Platelets: 147 10*3/uL — ABNORMAL LOW (ref 150–400)
RDW: 14.4 % (ref 11.5–15.5)
WBC: 12.5 10*3/uL — ABNORMAL HIGH (ref 4.0–10.5)

## 2012-11-15 LAB — TYPE AND SCREEN
Antibody Screen: NEGATIVE
DAT, IgG: NEGATIVE

## 2012-11-15 MED ORDER — POLYSACCHARIDE IRON COMPLEX 150 MG PO CAPS
150.0000 mg | ORAL_CAPSULE | Freq: Every day | ORAL | Status: DC
  Administered 2012-11-15 – 2012-11-16 (×2): 150 mg via ORAL
  Filled 2012-11-15 (×3): qty 1

## 2012-11-15 MED ORDER — ENOXAPARIN SODIUM 40 MG/0.4ML ~~LOC~~ SOLN
40.0000 mg | Freq: Every day | SUBCUTANEOUS | Status: DC
  Administered 2012-11-15 – 2012-11-16 (×2): 40 mg via SUBCUTANEOUS
  Filled 2012-11-15 (×3): qty 0.4

## 2012-11-15 NOTE — Progress Notes (Addendum)
Post Partum Day 1 NSVD viable female s/p IOL at [redacted]w[redacted]d  as Hx.  ALP and Hx of IUGR in past pregnancy Subjective: no complaints, up ad lib without syncope, voiding, tolerating PO, + flatus, +BM  Pain well controlled with po meds, taking motrin and percocet  BF: on demand Mood stable, bonding well To recommence on Lovenox 40 mgs s/c daily.   Objective: Blood pressure 91/58, pulse 72, temperature 97.2 F (36.2 C), temperature source Oral, resp. rate 18, height 5\' 7"  (1.702 m), weight 120.203 kg (265 lb), last menstrual period 02/22/2012, SpO2 100.00%, unknown if currently breastfeeding. - breastfeeding  Physical Exam:  General: alert, cooperative and no distress Breasts: N/T Lungs: CTAB Heart: RRR Lochia: appropriate, Rubra, moderate Uterine Fundus: firm at U Perineum: Intact DVT Evaluation: No evidence of DVT seen on physical exam. Negative Homan's sign. No cords or calf tenderness. No significant calf/ankle edema.   Basename 11/15/12 0545 11/14/12 0715  HGB 10.3* 11.2*  HCT 30.3* 33.4*   Anemia of pregnancy delivered.  Assessment/Plan: Plan for discharge tomorrow To recommence on Lovenox 40 mgs s/c daily due to ALP, prophylaxis      LOS: 1 day   Danai Gotto, CNM. 11/15/2012, 10:05 AM

## 2012-11-15 NOTE — Anesthesia Postprocedure Evaluation (Signed)
  Anesthesia Post-op Note  Patient: Molly Sanchez  Procedure(s) Performed: * No procedures listed *  Patient Location: Mother/Baby  Anesthesia Type:Epidural  Level of Consciousness: awake, alert  and oriented  Airway and Oxygen Therapy: Patient Spontanous Breathing  Post-op Pain: mild  Post-op Assessment: Patient's Cardiovascular Status Stable, Respiratory Function Stable, Patent Airway, No signs of Nausea or vomiting and Pain level controlled  Post-op Vital Signs: stable  Complications: No apparent anesthesia complications

## 2012-11-16 MED ORDER — OXYCODONE-ACETAMINOPHEN 5-325 MG PO TABS: 1.0000 | ORAL_TABLET | Freq: Four times a day (QID) | ORAL | Status: DC | PRN

## 2012-11-16 MED ORDER — IBUPROFEN 600 MG PO TABS: 600.0000 mg | ORAL_TABLET | Freq: Four times a day (QID) | ORAL | Status: DC | PRN

## 2012-11-16 MED ORDER — POLYSACCHARIDE IRON COMPLEX 150 MG PO CAPS: 150.0000 mg | ORAL_CAPSULE | Freq: Every day | ORAL | Status: DC

## 2012-11-16 NOTE — Discharge Summary (Signed)
Physician Discharge Summary  Patient ID: CAMBELL RICKENBACH MRN: [id number] DOB/AGE: 05/27/1971 41 y.o.  Admit date: 11/14/2012 Discharge date: 11/16/2012  Admission Diagnoses:dmiytted for IOL due to Hx of Previous IUGR and APL Syndrome  Discharge Diagnoses:  Active Problems:  NSVD (normal spontaneous vaginal delivery)  Antiphospholipid antibody syndrome   Discharged Condition: stable  Hospital Course: uncomplicated  Consults: None  Significant Diagnostic Studies: radiology: anatomy normal   Treatments: IV hydration and analgesia: acetaminophen w/ codeine and ibuprofen  Discharge Exam: Blood pressure 96/60, pulse 63, temperature 97.6 F (36.4 C), temperature source Oral, resp. rate 20, height 5\' 7"  (1.702 m), weight 120.203 kg (265 lb), last menstrual period 02/22/2012, SpO2 98.00%, unknown if currently breastfeeding.  ROS: General appearance: alert, cooperative and no distress Affect: AAO x 3 Breasts: N/T Lungs: CTAB CV: RRR Abdomen: soft, N/T, BM and B/S x 4 GI: normal GU: no problems voiding Extremities: Bilaterally, no swelling, No edema    Disposition: discharge to home with baby  Discharge Orders    Future Orders Please Complete By Expires   Diet general      Discharge instructions      Comments:   Per [facility] Booklet       Medication List     As of 11/16/2012  9:14 AM    TAKE these medications         acetaminophen-codeine 300-30 MG per tablet   Commonly known as: TYLENOL #3   Take 1 tablet by mouth every 4 (four) hours as needed. For pain with tooth issue      enoxaparin 40 MG/0.4ML injection   Commonly known as: LOVENOX   Inject 40 mg into the skin daily.      ibuprofen 600 MG tablet   Commonly known as: ADVIL,MOTRIN   Take 1 tablet (600 mg total) by mouth every 6 (six) hours as needed for pain.      iron polysaccharides 150 MG capsule   Commonly known as: NIFEREX   Take 1 capsule (150 mg total) by mouth daily.      oxyCODONE-acetaminophen  5-325 MG per tablet   Commonly known as: PERCOCET/ROXICET   Take 1-2 tablets by mouth every 6 (six) hours as needed (moderate - severe pain).      prenatal multivitamin Tabs   Take 1 tablet by mouth daily.           Follow-up Information    Follow up with [facility] In 6 weeks. (As needed)    Contact information:   [address] [place] [address] [phone]        NSVD viable female - uncomplicated Signed: Valdez Brannan, CNM. 11/16/2012, 9:14 AM

## 2012-11-16 NOTE — Progress Notes (Signed)
Post Partum Day  NSVD viable female without complications. Patient has APL syndrome and now on prophylaxis with Lovenox 40 mg s/c daily for 6 - 12 weeks PP.   Subjective: no complaints, up ad lib without syncope, voiding, tolerating PO, + flatus, +BM  Pain well controlled with po meds, taking motrin and percocet  BF: on demand and coping well Mood stable, bonding well.  Objective: Blood pressure 96/60, pulse 63, temperature 97.6 F (36.4 C), temperature source Oral, resp. rate 20, height 5\' 7"  (1.702 m), weight 120.203 kg (265 lb), last menstrual period 02/22/2012, SpO2 98.00%, unknown if currently breastfeeding.  Physical Exam:  General: alert, appears stated age and no distress Breast: N./T Lungs: CTAB Heart: RRR Lochia: appropriate Uterine Fundus: firm Perineum: intact DVT Evaluation: No evidence of DVT seen on physical exam. Negative Homan's sign. No cords or calf tenderness. No significant calf/ankle edema.   Basename 11/15/12 0545 11/14/12 0715  HGB 10.3* 11.2*  HCT 30.3* 33.4*     Continues of po Iron supplement due to Anemia of Pregnancy .  Assessment/Plan: Discharge home.       LOS: 2 days   Rishon Thilges 11/16/2012, 8:39 AM

## 2012-11-22 ENCOUNTER — Telehealth: Payer: Self-pay

## 2012-11-22 NOTE — Telephone Encounter (Signed)
Error

## 2012-12-02 ENCOUNTER — Ambulatory Visit (HOSPITAL_COMMUNITY): Admission: RE | Admit: 2012-12-02 | Payer: 59 | Source: Ambulatory Visit

## 2012-12-05 ENCOUNTER — Encounter (HOSPITAL_COMMUNITY): Payer: Self-pay

## 2014-01-02 ENCOUNTER — Encounter: Payer: Self-pay | Admitting: Internal Medicine

## 2014-01-02 ENCOUNTER — Other Ambulatory Visit: Payer: Self-pay

## 2014-01-02 DIAGNOSIS — Z1231 Encounter for screening mammogram for malignant neoplasm of breast: Secondary | ICD-10-CM

## 2014-01-13 ENCOUNTER — Ambulatory Visit: Payer: 59

## 2014-01-13 ENCOUNTER — Other Ambulatory Visit: Payer: Self-pay | Admitting: Internal Medicine

## 2014-01-13 ENCOUNTER — Ambulatory Visit (INDEPENDENT_AMBULATORY_CARE_PROVIDER_SITE_OTHER): Payer: 59 | Admitting: Internal Medicine

## 2014-01-13 VITALS — BP 124/72 | HR 93 | Temp 98.0°F | Resp 16 | Ht 66.0 in

## 2014-01-13 DIAGNOSIS — R1013 Epigastric pain: Secondary | ICD-10-CM

## 2014-01-13 DIAGNOSIS — K224 Dyskinesia of esophagus: Secondary | ICD-10-CM

## 2014-01-13 LAB — COMPREHENSIVE METABOLIC PANEL
ALBUMIN: 4.3 g/dL (ref 3.5–5.2)
ALK PHOS: 66 U/L (ref 39–117)
ALT: <8 U/L (ref 0–35)
AST: 15 U/L (ref 0–37)
BILIRUBIN TOTAL: 1.3 mg/dL — AB (ref 0.2–1.2)
BUN: 12 mg/dL (ref 6–23)
CO2: 26 mEq/L (ref 19–32)
Calcium: 10 mg/dL (ref 8.4–10.5)
Chloride: 101 mEq/L (ref 96–112)
Creat: 0.62 mg/dL (ref 0.50–1.10)
Glucose, Bld: 79 mg/dL (ref 70–99)
POTASSIUM: 4.1 meq/L (ref 3.5–5.3)
SODIUM: 138 meq/L (ref 135–145)
TOTAL PROTEIN: 7.4 g/dL (ref 6.0–8.3)

## 2014-01-13 LAB — POCT UA - MICROSCOPIC ONLY
CASTS, UR, LPF, POC: NEGATIVE
Crystals, Ur, HPF, POC: NEGATIVE
YEAST UA: NEGATIVE

## 2014-01-13 LAB — POCT CBC
Granulocyte percent: 72.7 %G (ref 37–80)
HCT, POC: 45.8 % (ref 37.7–47.9)
HEMOGLOBIN: 14.5 g/dL (ref 12.2–16.2)
LYMPH, POC: 1.7 (ref 0.6–3.4)
MCH, POC: 30 pg (ref 27–31.2)
MCHC: 31.7 g/dL — AB (ref 31.8–35.4)
MCV: 94.9 fL (ref 80–97)
MID (CBC): 0.5 (ref 0–0.9)
MPV: 10.9 fL (ref 0–99.8)
PLATELET COUNT, POC: 249 10*3/uL (ref 142–424)
POC GRANULOCYTE: 6 (ref 2–6.9)
POC LYMPH PERCENT: 20.9 %L (ref 10–50)
POC MID %: 6.4 % (ref 0–12)
RBC: 4.83 M/uL (ref 4.04–5.48)
RDW, POC: 14 %
WBC: 8.3 10*3/uL (ref 4.6–10.2)

## 2014-01-13 LAB — POCT URINALYSIS DIPSTICK
GLUCOSE UA: NEGATIVE
Ketones, UA: 80
NITRITE UA: NEGATIVE
PROTEIN UA: 30
Spec Grav, UA: >=1.03
UROBILINOGEN UA: 0.2
pH, UA: 5.5

## 2014-01-13 LAB — LIPASE: Lipase: 13 U/L (ref 0–75)

## 2014-01-13 LAB — POCT SEDIMENTATION RATE: POCT SED RATE: 40 mm/h — AB (ref 0–22)

## 2014-01-13 MED ORDER — LORAZEPAM 1 MG PO TABS: 1.0000 mg | ORAL_TABLET | Freq: Two times a day (BID) | ORAL | Status: DC | PRN

## 2014-01-13 NOTE — Patient Instructions (Addendum)
Abdominal Pain, Adult Many things can cause abdominal pain. Usually, abdominal pain is not caused by a disease and will improve without treatment. It can often be observed and treated at home. Your health care provider will do a physical exam and possibly order blood tests and X-rays to help determine the seriousness of your pain. However, in many cases, more time must pass before a clear cause of the pain can be found. Before that point, your health care provider may not know if you need more testing or further treatment. HOME CARE INSTRUCTIONS  Monitor your abdominal pain for any changes. The following actions may help to alleviate any discomfort you are experiencing:  Only take over-the-counter or prescription medicines as directed by your health care provider.  Do not take laxatives unless directed to do so by your health care provider.  Try a clear liquid diet (broth, tea, or water) as directed by your health care provider. Slowly move to a bland diet as tolerated. SEEK MEDICAL CARE IF:  You have unexplained abdominal pain.  You have abdominal pain associated with nausea or diarrhea.  You have pain when you urinate or have a bowel movement.  You experience abdominal pain that wakes you in the night.  You have abdominal pain that is worsened or improved by eating food.  You have abdominal pain that is worsened with eating fatty foods. SEEK IMMEDIATE MEDICAL CARE IF:   Your pain does not go away within 2 hours.  You have a fever.  You keep throwing up (vomiting).  Your pain is felt only in portions of the abdomen, such as the right side or the left lower portion of the abdomen.  You pass bloody or black tarry stools. MAKE SURE YOU:  Understand these instructions.   Will watch your condition.   Will get help right away if you are not doing well or get worse.  Document Released: 09/06/2005 Document Revised: 09/17/2013 Document Reviewed: 08/06/2013 [facility] Patient  Information 2014 [place]. Atomoxetine capsules What is this medicine? ATOMOXETINE (AT oh mox e teen) is used to treat attention deficit/hyperactivity disorder, also known as ADHD. It is not a stimulant like other drugs for ADHD. This drug can improve attention span, concentration, and emotional control. It can also reduce restless or overactive behavior. This medicine may be used for other purposes; ask your health care provider or pharmacist if you have questions. COMMON BRAND NAME(S): Strattera What should I tell my health care provider before I take this medicine? They need to know if you have any of these conditions: -glaucoma -high or low blood pressure -history of stroke -irregular heartbeat or other cardiac disease -liver disease -mania or bipolar disorder -pheochromocytoma -suicidal thoughts -an unusual or allergic reaction to atomoxetine, other medicines, foods, dyes, or preservatives -pregnant or trying to get pregnant -breast-feeding How should I use this medicine? Take this medicine by mouth with a glass of water. Follow the directions on the prescription label. You can take it with or without food. If it upsets your stomach, take it with food. If you have difficulty sleeping and you take more than 1 dose per day, take your last dose before 6 PM. Take your medicine at regular intervals. Do not take it more often than directed. Do not stop taking except on your doctor's advice. A special MedGuide will be given to you by the pharmacist with each prescription and refill. Be sure to read this information carefully each time. Talk to your pediatrician regarding the use  of this medicine in children. While this drug may be prescribed for children as young as 6 years for selected conditions, precautions do apply. Overdosage: If you think you have taken too much of this medicine contact a poison control center or emergency room at once. NOTE: This medicine is only for you. Do not  share this medicine with others. What if I miss a dose? If you miss a dose, take it as soon as you can. If it is almost time for your next dose, take only that dose. Do not take double or extra doses. What may interact with this medicine? Do not take this medicine with any of the following medications: -MAOIs like Carbex, Eldepryl, Marplan, Nardil, and Parnate -reboxetine This medicine may also interact with the following medications: -albuterol -certain medicines for blood pressure, heart disease, irregular heart beat -certain medicines for depression, anxiety, or psychotic disturbances -cold or allergy medicines -isoproterenol -medicines that increase blood pressure like dopamine, dobutamine, or ephedrine -stimulant medicines for attention disorders, weight loss, or to stay awake -terbutaline This list may not describe all possible interactions. Give your health care provider a list of all the medicines, herbs, non-prescription drugs, or dietary supplements you use. Also tell them if you smoke, drink alcohol, or use illegal drugs. Some items may interact with your medicine. What should I watch for while using this medicine? It may take a week or more for this medicine to take effect. This is why it is very important to continue taking the medicine and not miss any doses. If you have been taking this medicine regularly for some time, do not suddenly stop taking it. Ask your doctor or health care professional for advice. Rarely, this medicine may increase thoughts of suicide or suicide attempts in children and teenagers. Call your child's health care professional right away if your child or teenager has new or increased thoughts of suicide or has changes in mood or behavior like becoming irritable or anxious. Regularly monitor your child for these behavioral changes. For males, contact you doctor or health care professional right away if you have an erection that lasts longer than 4 hours or if  it becomes painful. This may be a sign of serious problem and must be treated right away to prevent permanent damage. You may get drowsy or dizzy. Do not drive, use machinery, or do anything that needs mental alertness until you know how this medicine affects you. Do not stand or sit up quickly, especially if you are an older patient. This reduces the risk of dizzy or fainting spells. Alcohol can make you more drowsy and dizzy. Avoid alcoholic drinks. Do not treat yourself for coughs, colds or allergies without asking your doctor or health care professional for advice. Some ingredients can increase possible side effects. Your mouth may get dry. Chewing sugarless gum or sucking hard candy, and drinking plenty of water will help. What side effects may I notice from receiving this medicine? Side effects that you should report to your doctor or health care professional as soon as possible: -allergic reactions like skin rash, itching or hives, swelling of the face, lips, or tongue -breathing problems -chest pain -dark urine -fast, irregular heartbeat -general ill feeling or flu-like symptoms -high blood pressure -males: prolonged or painful erection -stomach pain or tenderness -trouble passing urine or change in the amount of urine -vomiting -weight loss -yellowing of the eyes or skin Side effects that usually do not require medical attention (report to your doctor or health  care professional if they continue or are bothersome): -change in sex drive or performance -constipation or diarrhea -headache -loss of appetite -menstrual period irregularities -nausea -stomach upset This list may not describe all possible side effects. Call your doctor for medical advice about side effects. You may report side effects to FDA at 1-800-FDA-1088. Where should I keep my medicine? Keep out of the reach of children. Store at room temperature between 15 and 30 degrees C (59 and 86 degrees F). Throw away any  unused medication after the expiration date. NOTE: This sheet is a summary. It may not cover all possible information. If you have questions about this medicine, talk to your doctor, pharmacist, or health care provider.  2014, [employer]/Gold Standard. (2013-08-18 14:33:10) Esophageal Spasm Esophageal spasm is an uncoordinated contraction of the muscles of the esophagus (the tube which carries food from your mouth to your stomach). Normally, the muscles of the esophagus alternate between contraction and relaxation starting from the top of the esophagus and working down to the bottom. This moves the food from the mouth to the stomach. In esophageal spasm, all the muscles contract at once. This causes pain and fails to move the food along. As a result, you may have trouble swallowing.  Women are more likely than men to have esophageal spasm. The cause of the spasms is not known. Sometimes eating hot or cold foods triggers the condition and this may be due to an overly sensitive esophagus. This is not an infectious disease and cannot be passed to others. SYMPTOMS  Symptoms of esophageal spasm may include: chest pain, burning or pain with swallowing, and difficulty swallowing.  DIAGNOSIS  Esophageal spasm can be diagnosed by a test called manometry (pressure studies of the esophagus). In this test, a special tube is inserted down the esophagus. The tube measures the muscle activity of the esophagus. Abnormal contractions mixed with normal movement helps confirm the diagnosis.  A person with a hypersensitive esophagus may be diagnosed by inflating a long balloon in the person's esophagus. If this causes the same symptoms, preventive methods may work. PREVENTION  Avoid hot or cold foods if that seems to be a trigger. PROGNOSIS  This condition does not go away, nor is treatment entirely satisfactory. Patients need to be careful of what they eat. They need to continue on medication if a useful one is found.  Fortunately, the condition does not get progressively worse as time passes. Esophageal spasm does not usually lead to more serious problems but sometimes the pain can be disabling. If a person becomes afraid to eat they may become malnourished and lose weight.  TREATMENT   A procedure in which instruments of increasing size are inserted through the esophagus to enlarge (dilate) it are used.  Medications that decrease acid-production of the stomach may be used such as proton-pump inhibitors or H2-blockers.  Medications of several types can be used to relax the muscles of the esophagus.  An individual with a hypersensitive esophagus sometimes improves with low doses of medications normally used for depression.  No treatment for esophageal spasm is effective for everyone. Often several approaches will be tried before one works. In many cases, the symptoms will improve, but will not go away completely.  For severe cases, relief is obtained two-thirds of the time by cutting the muscles along the entire length of the esophagus. This is a major surgical procedure.  Your symptoms are usually the best guide to how well the treatment for esophageal spasm works. SIDE  EFFECTS OF TREATMENTS  Nitrates can cause headaches and low blood pressure.  Calcium channel blockers can cause:  Feeling sick to your stomach (nausea).  Constipation and other side effects.  Antidepressants can cause side effects that depend on the medication used. HOME CARE INSTRUCTIONS   Let your caregiver know if problems are getting worse, or if you get food stuck in your esophagus for longer than 1 hour or as directed and are unable to swallow liquid.  Take medications as directed and with permission of your caregiver. Ask about what to do if a medication seems to get stuck in your esophagus. Only take over-the-counter or prescription medicines for pain, discomfort, or fever as directed by your caregiver.  Soft and liquid  foods pass more easily than solid pieces. SEEK IMMEDIATE MEDICAL CARE IF:   You develop severe chest pain, especially if the pain is crushing or pressure-like and spreads to the arms, back, neck, or jaw, or if you have sweating, nausea, or shortness of breath. THIS COULD BE AN EMERGENCY. Do not wait to see if the pain will go away. Get medical help at once. Call 911 or [phone] (operator). DO NOT drive yourself to the hospital.  Your chest pain gets worse and does not go away with rest.  You have an attack of chest pain lasting longer than usual despite rest and treatment with the medications your physician has prescribed.  You wake from sleep with chest pain or shortness of breath.  You feel dizzy or faint.  You have chest pain, not typical of your usual pain, caused by your esophagus for which you originally saw your caregiver. MAKE SURE YOU:   Understand these instructions.  Will watch your condition.  Will get help right away if you are not doing well or get worse. Document Released: 02/17/2003 Document Revised: 02/19/2012 Document Reviewed: 09/01/2008 [facility] Patient Information 2014 [place], [place].

## 2014-01-13 NOTE — Progress Notes (Signed)
Subjective:    Patient ID: Molly Sanchez, female    DOB: 03-14-1971, 43 y.o.   MRN: [id number]  HPI Patient presents with 3 day history of upper abdominal pain.  Patient denies nausea, vomiting, diarrhea, or constipation.  Patient states she's just started Strattera. The pain is mid epigastric, feels like she needs to burp, nausea, no vomiting. Bubble pressure sensation, not ruq tendernessor abdominal tenderness. Pain /bubble sensation lower substernal. No food catching or dysphagia  Stat EKG normal and same as 2011  No diaphoresis, fever, constipation, or diarrhea.  Strattera common side affect is abdominal pain, nausea  Review of Systems     Objective:   Physical Exam  Vitals reviewed. Constitutional: She is oriented to person, place, and time. She appears well-developed and well-nourished.  Eyes: EOM are normal. No scleral icterus.  Neck: Normal range of motion. Neck supple.  Cardiovascular: Normal rate, regular rhythm, normal heart sounds and intact distal pulses.  Exam reveals no gallop and no friction rub.   No murmur heard. Pulmonary/Chest: Effort normal and breath sounds normal.  Abdominal: Soft. She exhibits no distension and no mass. There is no tenderness. There is no rebound and no guarding.  Neurological: She is alert and oriented to person, place, and time. No cranial nerve deficit. She exhibits normal muscle tone. Coordination normal.  Psychiatric: She has a normal mood and affect. Her behavior is normal. Thought content normal.   GI cocktailrial some improvement [facility] reading (PRIMARY) by  Dr.Guest ileus seen, no free air, IUD present  Results for orders placed in visit on 01/13/14  POCT CBC      Result Value Range   WBC 8.3  4.6 - 10.2 K/uL   Lymph, poc 1.7  0.6 - 3.4   POC LYMPH PERCENT 20.9  10 - 50 %L   MID (cbc) 0.5  0 - 0.9   POC MID % 6.4  0 - 12 %M   POC Granulocyte 6.0  2 - 6.9   Granulocyte percent 72.7  37 - 80 %G   RBC 4.83  4.04 - 5.48 M/uL   Hemoglobin 14.5  12.2 - 16.2 g/dL   HCT, POC 45.8  37.7 - 47.9 %   MCV 94.9  80 - 97 fL   MCH, POC 30.0  27 - 31.2 pg   MCHC 31.7 (*) 31.8 - 35.4 g/dL   RDW, POC 14.0     Platelet Count, POC 249  142 - 424 K/uL   MPV 10.9  0 - 99.8 fL  POCT UA - MICROSCOPIC ONLY      Result Value Range   WBC, Ur, HPF, POC 3-5     RBC, urine, microscopic 0-2     Bacteria, U Microscopic 2+     Mucus, UA trace     Epithelial cells, urine per micros 7-12     Crystals, Ur, HPF, POC negative     Casts, Ur, LPF, POC negative     Yeast, UA negative    POCT URINALYSIS DIPSTICK      Result Value Range   Color, UA yellow     Clarity, UA cloudy     Glucose, UA negative     Bilirubin, UA small     Ketones, UA 80     Spec Grav, UA >=1.030     Blood, UA small     pH, UA 5.5     Protein, UA 30     Urobilinogen, UA 0.2  Nitrite, UA negative     Leukocytes, UA Trace     CS urine        Assessment & Plan:  Stop strattera Clear liquids only Lorazepam 1mg  for spasm Consider PPI Await chemistries Recheck in am

## 2014-01-14 ENCOUNTER — Telehealth: Payer: Self-pay | Admitting: Family Medicine

## 2014-01-14 DIAGNOSIS — K921 Melena: Secondary | ICD-10-CM

## 2014-01-14 LAB — IFOBT (OCCULT BLOOD): IFOBT: POSITIVE

## 2014-01-14 NOTE — Telephone Encounter (Signed)
Patients hemosure was positive. Would you like her to do the 3 hemocult card test? I can get them together and put them in computer for Lab only. Please advise.

## 2014-01-15 NOTE — Telephone Encounter (Signed)
Spoke with patient about labs.  Dr. Elder Cyphers added H.pylori to patients labs.  Will sent up referral for GI

## 2014-01-16 ENCOUNTER — Encounter: Payer: Self-pay | Admitting: Internal Medicine

## 2014-01-16 LAB — H. PYLORI ANTIBODY, IGG: H Pylori IgG: 0.61 {ISR}

## 2014-01-22 ENCOUNTER — Ambulatory Visit: Payer: 59

## 2014-02-16 ENCOUNTER — Encounter: Payer: Self-pay | Admitting: Internal Medicine

## 2014-02-17 ENCOUNTER — Ambulatory Visit: Payer: 59 | Admitting: Internal Medicine

## 2014-03-26 ENCOUNTER — Encounter: Payer: Self-pay | Admitting: Internal Medicine

## 2014-03-31 ENCOUNTER — Encounter: Payer: Self-pay | Admitting: Internal Medicine

## 2014-03-31 ENCOUNTER — Ambulatory Visit (INDEPENDENT_AMBULATORY_CARE_PROVIDER_SITE_OTHER): Payer: 59 | Admitting: Internal Medicine

## 2014-03-31 ENCOUNTER — Other Ambulatory Visit (INDEPENDENT_AMBULATORY_CARE_PROVIDER_SITE_OTHER): Payer: 59

## 2014-03-31 VITALS — BP 114/66 | HR 70 | Ht 67.0 in | Wt 217.0 lb

## 2014-03-31 DIAGNOSIS — R1010 Upper abdominal pain, unspecified: Secondary | ICD-10-CM

## 2014-03-31 DIAGNOSIS — R109 Unspecified abdominal pain: Secondary | ICD-10-CM

## 2014-03-31 DIAGNOSIS — R195 Other fecal abnormalities: Secondary | ICD-10-CM

## 2014-03-31 LAB — HEPATIC FUNCTION PANEL
ALBUMIN: 3.6 g/dL (ref 3.5–5.2)
ALK PHOS: 56 U/L (ref 39–117)
ALT: 12 U/L (ref 0–35)
AST: 17 U/L (ref 0–37)
BILIRUBIN DIRECT: 0.1 mg/dL (ref 0.0–0.3)
TOTAL PROTEIN: 7.1 g/dL (ref 6.0–8.3)
Total Bilirubin: 1 mg/dL (ref 0.3–1.2)

## 2014-03-31 LAB — TSH: TSH: 1.29 u[IU]/mL (ref 0.35–5.50)

## 2014-03-31 LAB — IGA: IgA: 252 mg/dL (ref 68–378)

## 2014-03-31 MED ORDER — MOVIPREP 100 G PO SOLR: ORAL | Status: DC

## 2014-03-31 MED ORDER — PANTOPRAZOLE SODIUM 40 MG PO TBEC: 40.0000 mg | DELAYED_RELEASE_TABLET | Freq: Every day | ORAL | Status: DC

## 2014-03-31 NOTE — Patient Instructions (Signed)
You have been scheduled for a colonoscopy with propofol. Please follow written instructions given to you at your visit today.  Please pick up your prep kit at the pharmacy within the next 1-3 days. If you use inhalers (even only as needed), please bring them with you on the day of your procedure. Your physician has requested that you go to www.startemmi.com and enter the access code given to you at your visit today. This web site gives a general overview about your procedure. However, you should still follow specific instructions given to you by our office regarding your preparation for the procedure.  You have been scheduled for an abdominal ultrasound at [facility] (1st floor of hospital) on 05/07/2014 at 11:00. Please arrive 15 minutes prior to your appointment for registration. Make certain not to have anything to eat or drink 6 hours prior to your appointment. Should you need to reschedule your appointment, please contact radiology at [phone]. This test typically takes about 30 minutes to perform.  Your physician has requested that you go to the basement for the following lab work before leaving today: hepatic function, celiac panel. TSH  We have sent the following medications to your pharmacy for you to pick up at your convenience: protonix 40 mg

## 2014-03-31 NOTE — Progress Notes (Signed)
Patient ID: Molly Sanchez, female   DOB: 1971/05/08, 43 y.o.   MRN: [id number] HPI: Molly Sanchez is a 43 yo female with PMH of antiphospholipid antibody syndrome, fibromyalgia and breast fibroadenoma who is seen in consultation at the request of Dr. Elder Cyphers to evaluate heme-positive stool an upper abdominal pain. She is here alone today. She reports over the last several months, worse approximately 6 weeks ago, she was experiencing epigastric abdominal pain. This is associated with nausea. It tends to be worse when she eats greasy foods. She does not have vomiting but she feels that it would help if she could vomit. She does have heartburn occurring 3 or more days per week. It is diet dependent. She is currently eating a low carb diet and low-fat diet in an attempt to lose weight. She has not had unexpected weight loss. No dysphagia or odynophagia. Bowel habits have been regular for her which occur approximately once every 5 days. She denies diarrhea. She denies melena or blood in her stool. She does recall about 10 years ago being told that she might need her gallbladder out for similar upper symptoms. She denies itching or jaundice. No family history of liver disease.   She had started Strattera which he took about 3 weeks but this was stopped by primary care taking it maybe contributed to upper GI symptoms. She had FOBT positive test on 01/14/2014 with a normal CMP except for a mild elevation in total bilirubin at 1.3. H. pylori antibody was negative, CBC was normal, and ESR was elevated at 40  Past Medical History  Diagnosis Date  . Dysplasia of cervix, low grade (CIN 1) [id number]  . Rosacea   . Fibromyalgia   . ANA positive   . Raynaud's disease   . Breast fibroadenoma     history of  . Antiphospholipid antibody syndrome complicating pregnancy   . H/O varicella   . Antiphospholipid antibody syndrome 11/15/2012    Past Surgical History  Procedure Laterality Date  . Cesarean section    .  Gynecologic cryosurgery      of cervix  . Right breast fibroadenoma removed     Meds: none  No Known Allergies  Family History  Problem Relation Age of Onset  . Hypertension Mother   . Stroke Mother   . Mental illness Mother     schizophrenia  . Heart attack Mother   . Mental illness Brother     schizophrenia    History  Substance Use Topics  . Smoking status: Never Smoker   . Smokeless tobacco: Never Used  . Alcohol Use: No    ROS: As per history of present illness, otherwise negative  BP 114/66  Pulse 70  Ht 5' 7" (1.702 m)  Wt 217 lb (98.431 kg)  BMI 33.98 kg/m2  LMP 03/29/2014 Constitutional: Well-developed and well-nourished. No distress. HEENT: Normocephalic and atraumatic. Oropharynx is clear and moist. No oropharyngeal exudate. Conjunctivae are normal.  No scleral icterus. Neck: Neck supple. Trachea midline. Cardiovascular: Normal rate, regular rhythm and intact distal pulses. No M/R/G Pulmonary/chest: Effort normal and breath sounds normal. No wheezing, rales or rhonchi. Abdominal: Soft, mild diffuse tenderness without rebound or guard, nondistended. Bowel sounds active throughout. There are no masses palpable. No hepatosplenomegaly. Extremities: no clubbing, cyanosis, or edema Lymphadenopathy: No cervical adenopathy noted. Neurological: Alert and oriented to person place and time. Skin: Skin is warm and dry. No rashes noted. Psychiatric: Normal mood and affect. Behavior is normal.  RELEVANT LABS AND IMAGING:  CBC    Component Value Date/Time   WBC 8.3 01/13/2014 1638   WBC 12.5* 11/15/2012 0545   WBC 7.8 10/26/2010 1052   RBC 4.83 01/13/2014 1638   RBC 3.45* 11/15/2012 0545   RBC 5.00 10/26/2010 1052   HGB 14.5 01/13/2014 1638   HGB 10.3* 11/15/2012 0545   HGB 15.6 10/26/2010 1052   HCT 45.8 01/13/2014 1638   HCT 30.3* 11/15/2012 0545   HCT 46.6 10/26/2010 1052   PLT 147* 11/15/2012 0545   PLT 220 10/26/2010 1052   MCV 94.9 01/13/2014 1638   MCV 87.8  11/15/2012 0545   MCV 93.3 10/26/2010 1052   MCH 30.0 01/13/2014 1638   MCH 29.9 11/15/2012 0545   MCH 31.3 10/26/2010 1052   MCHC 31.7* 01/13/2014 1638   MCHC 34.0 11/15/2012 0545   MCHC 33.5 10/26/2010 1052   RDW 14.4 11/15/2012 0545   RDW 14.2 10/26/2010 1052   LYMPHSABS 1.0 10/26/2010 1052   MONOABS 0.5 10/26/2010 1052   EOSABS 0.0 10/26/2010 1052   BASOSABS 0.0 10/26/2010 1052    CMP     Component Value Date/Time   NA 138 01/13/2014 1628   K 4.1 01/13/2014 1628   CL 101 01/13/2014 1628   CO2 26 01/13/2014 1628   GLUCOSE 79 01/13/2014 1628   BUN 12 01/13/2014 1628   CREATININE 0.62 01/13/2014 1628   CREATININE 0.86 10/26/2010 1052   CALCIUM 10.0 01/13/2014 1628   PROT 7.1 03/31/2014 1015   ALBUMIN 3.6 03/31/2014 1015   AST 17 03/31/2014 1015   ALT 12 03/31/2014 1015   ALKPHOS 56 03/31/2014 1015   BILITOT 1.0 03/31/2014 1015    ASSESSMENT/PLAN: 43 yo female with PMH of antiphospholipid antibody syndrome, fibromyalgia and breast fibroadenoma who is seen in consultation at the request of Dr. Elder Cyphers to evaluate heme-positive stool an upper abdominal pain.  1.  Upper abd pain/FOBT + -- her upper symptoms could be consistent with ulcer-like dyspepsia or gallbladder dysfunction/biliary dyskinesia. Her total bilirubin was slightly elevated but this may be of no clinical significance. I have repeated LFTs today. We discussed how to evaluate her current symptoms along with the positive fecal occult blood test. She is 43 years old and has not reached CRC screening age, but with a positive FOBT I recommended colonoscopy at this time. I have also recommended upper endoscopy to evaluate her upper GI symptoms. We discussed both tests including the risks and benefits and she is agreeable to proceed. I also recommend an abdominal ultrasound to evaluate gallbladder. I will empirically start pantoprazole 40 mg daily as a PPI trial for heartburn and dyspepsia. She does have a family history of celiac disease, her maternal  grandmother, and we will check a celiac panel today. TSH recently normal

## 2014-04-01 LAB — TISSUE TRANSGLUTAMINASE, IGA: Tissue Transglutaminase Ab, IgA: 3.9 U/mL (ref <20)

## 2014-04-07 ENCOUNTER — Ambulatory Visit (HOSPITAL_COMMUNITY): Payer: 59

## 2014-04-10 ENCOUNTER — Ambulatory Visit (HOSPITAL_COMMUNITY)
Admission: RE | Admit: 2014-04-10 | Discharge: 2014-04-10 | Disposition: A | Discharge status: Home / Self Care | Payer: 59 | Source: Ambulatory Visit | Attending: Internal Medicine | Admitting: Internal Medicine

## 2014-04-10 DIAGNOSIS — R1010 Upper abdominal pain, unspecified: Secondary | ICD-10-CM

## 2014-04-10 DIAGNOSIS — R109 Unspecified abdominal pain: Secondary | ICD-10-CM | POA: Insufficient documentation

## 2014-05-11 ENCOUNTER — Encounter: Payer: 59 | Admitting: Internal Medicine

## 2014-05-11 ENCOUNTER — Telehealth: Payer: Self-pay | Admitting: Internal Medicine

## 2014-05-11 NOTE — Telephone Encounter (Signed)
No charge. 
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
## 2014-05-22 ENCOUNTER — Encounter: Payer: 59 | Admitting: Internal Medicine

## 2014-06-29 ENCOUNTER — Encounter: Payer: Self-pay | Admitting: Internal Medicine

## 2014-06-29 ENCOUNTER — Telehealth: Payer: Self-pay | Admitting: Internal Medicine

## 2014-06-29 ENCOUNTER — Ambulatory Visit (AMBULATORY_SURGERY_CENTER): Payer: 59 | Admitting: Internal Medicine

## 2014-06-29 VITALS — BP 130/68 | HR 68 | Temp 96.8°F | Resp 20 | Ht 67.0 in | Wt 217.0 lb

## 2014-06-29 DIAGNOSIS — R1013 Epigastric pain: Secondary | ICD-10-CM

## 2014-06-29 DIAGNOSIS — R109 Unspecified abdominal pain: Secondary | ICD-10-CM

## 2014-06-29 DIAGNOSIS — R195 Other fecal abnormalities: Secondary | ICD-10-CM

## 2014-06-29 DIAGNOSIS — K3189 Other diseases of stomach and duodenum: Secondary | ICD-10-CM

## 2014-06-29 DIAGNOSIS — D126 Benign neoplasm of colon, unspecified: Secondary | ICD-10-CM

## 2014-06-29 MED ORDER — FLEET ENEMA 7-19 GM/118ML RE ENEM
1.0000 | ENEMA | Freq: Once | RECTAL | Status: AC
  Administered 2014-06-29: 1 via RECTAL

## 2014-06-29 MED ORDER — SODIUM CHLORIDE 0.9 % IV SOLN
500.0000 mL | INTRAVENOUS | Status: DC
Start: 2014-06-29 — End: 2014-06-29

## 2014-06-29 NOTE — Op Note (Signed)
[facility]  [address] [place], [address]   COLONOSCOPY PROCEDURE REPORT  PATIENT: Molly, Sanchez  MR#: [id number] BIRTHDATE: 05/04/1971 , 43  yrs. old GENDER: Female ENDOSCOPIST: Jerene Bears, MD REFERRED VE:HMCNO Guest, M.D. PROCEDURE DATE:  06/29/2014 PROCEDURE:   Colonoscopy with snare polypectomy and Colonoscopy with cold biopsy polypectomy First Screening Colonoscopy - Avg.  risk and is 50 yrs.  old or older - No.  Prior Negative Screening - Now for repeat screening. N/A  History of Adenoma - Now for follow-up colonoscopy & has been > or = to 3 yrs.  N/A  Polyps Removed Today? Yes. ASA CLASS:   Class II INDICATIONS:heme-positive stool. MEDICATIONS: MAC sedation, administered by CRNA and propofol (Diprivan) 230mg  IV  DESCRIPTION OF PROCEDURE:   After the risks benefits and alternatives of the procedure were thoroughly explained, informed consent was obtained.  A digital rectal exam revealed no rectal mass.   The LB PFC-H190 [id number]  endoscope was introduced through the anus and advanced to the cecum, which was identified by both the appendix and ileocecal valve. No adverse events experienced. The quality of the prep was Moviprep fair  The instrument was then slowly withdrawn as the colon was fully examined.  COLON FINDINGS: Four sessile polyps were found at the cecum (1), in the ascending colon (2), and rectum (1).  The polyps were removed using cold forceps in the cecum, and cold snare for the remaining polyps. Resection and retrieval complete.   The colon mucosa was otherwise normal.  Retroflexed views revealed no abnormalities. The time to cecum=3 minutes 40 seconds.  Withdrawal time=14 minutes 19 seconds.  The scope was withdrawn and the procedure completed.  COMPLICATIONS: There were no complications.  ENDOSCOPIC IMPRESSION: 1.   Four sessile polyps were found at the cecum, in the ascending colon, and rectum; removed and sent to  pathology 2.   The colon mucosa was otherwise normal      RECOMMENDATIONS: 1.  Await pathology results 2.  Timing of repeat colonoscopy will be determined by pathology findings. 3.  You will receive a letter within 1-2 weeks with the results of your biopsy as well as final recommendations.  Please call my office if you have not received a letter after 3 weeks.   eSigned:  Jerene Bears, MD 06/29/2014 2:15 PM   cc: The Patient and Lou Miner, MD   PATIENT NAME:  Molly, Sanchez MR#: [id number]

## 2014-06-29 NOTE — Progress Notes (Signed)
Procedure ends, to recovery, report given and VSS. 

## 2014-06-29 NOTE — Telephone Encounter (Signed)
Returned patient's call and she states that she could not drink the entire bottle of the second prep today.  I asked her what her stools look like and she said a dark brown color.  I advised her to come on in at scheduled time and if her stools are still are dark brown, we can consult with Dr. Hilarie Fredrickson and give her an enema if needed.  She agreed and all questions were answered.

## 2014-06-29 NOTE — Patient Instructions (Signed)
YOU HAD AN ENDOSCOPIC PROCEDURE TODAY AT THE [facility]: Refer to the procedure report that was given to you for any specific questions about what was found during the examination.  If the procedure report does not answer your questions, please call your gastroenterologist to clarify.  If you requested that your care partner not be given the details of your procedure findings, then the procedure report has been included in a sealed envelope for you to review at your convenience later.  YOU SHOULD EXPECT: Some feelings of bloating in the abdomen. Passage of more gas than usual.  Walking can help get rid of the air that was put into your GI tract during the procedure and reduce the bloating. If you had a lower endoscopy (such as a colonoscopy or flexible sigmoidoscopy) you may notice spotting of blood in your stool or on the toilet paper. If you underwent a bowel prep for your procedure, then you may not have a normal bowel movement for a few days.  DIET: Your first meal following the procedure should be a light meal and then it is ok to progress to your normal diet.  A half-sandwich or bowl of soup is an example of a good first meal.  Heavy or fried foods are harder to digest and may make you feel nauseous or bloated.  Likewise meals heavy in dairy and vegetables can cause extra gas to form and this can also increase the bloating.  Drink plenty of fluids but you should avoid alcoholic beverages for 24 hours.  ACTIVITY: Your care partner should take you home directly after the procedure.  You should plan to take it easy, moving slowly for the rest of the day.  You can resume normal activity the day after the procedure however you should NOT DRIVE or use heavy machinery for 24 hours (because of the sedation medicines used during the test).    SYMPTOMS TO REPORT IMMEDIATELY: A gastroenterologist can be reached at any hour.  During normal business hours, 8:30 AM to 5:00 PM Monday through Friday,  call [phone].  After hours and on weekends, please call the GI answering service at [phone] who will take a message and have the physician on call contact you.   Following lower endoscopy (colonoscopy or flexible sigmoidoscopy):  Excessive amounts of blood in the stool  Significant tenderness or worsening of abdominal pains  Swelling of the abdomen that is new, acute  Fever of 100F or higher  Following upper endoscopy (EGD)  Vomiting of blood or coffee ground material  New chest pain or pain under the shoulder blades  Painful or persistently difficult swallowing  New shortness of breath  Fever of 100F or higher  Black, tarry-looking stools  FOLLOW UP: If any biopsies were taken you will be contacted by phone or by letter within the next 1-3 weeks.  Call your gastroenterologist if you have not heard about the biopsies in 3 weeks.  Our staff will call the home number listed on your records the next business day following your procedure to check on you and address any questions or concerns that you may have at that time regarding the information given to you following your procedure. This is a courtesy call and so if there is no answer at the home number and we have not heard from you through the emergency physician on call, we will assume that you have returned to your regular daily activities without incident.  SIGNATURES/CONFIDENTIALITY: You and/or your care   partner have signed paperwork which will be entered into your electronic medical record.  These signatures attest to the fact that that the information above on your After Visit Summary has been reviewed and is understood.  Full responsibility of the confidentiality of this discharge information lies with you and/or your care-partner.  Resume medications. Information given on polyps with discharge instructions.

## 2014-06-29 NOTE — Progress Notes (Signed)
Called to room to assist during endoscopic procedure.  Patient ID and intended procedure confirmed with present staff. Received instructions for my participation in the procedure from the performing physician.  

## 2014-06-29 NOTE — Op Note (Signed)
[facility]  [address]. [place], [address]   ENDOSCOPY PROCEDURE REPORT  PATIENT: Molly Sanchez, Molly Sanchez  MR#: [id number] BIRTHDATE: November 29, 1971 , 43  yrs. old GENDER: Female ENDOSCOPIST: Jerene Bears, MD REFERRED BY:  Lou Miner, M.D. PROCEDURE DATE:  06/29/2014 PROCEDURE:  EGD, diagnostic ASA CLASS:     Class II INDICATIONS:  Epigastric pain.   Dyspepsia.   Heme positive stool. MEDICATIONS: MAC sedation, administered by CRNA, propofol (Diprivan) 150mg  IV, and Glycopyrrolate (Robinul) 0.2 mg IV TOPICAL ANESTHETIC: none  DESCRIPTION OF PROCEDURE: After the risks benefits and alternatives of the procedure were thoroughly explained, informed consent was obtained.  The LB PFC-H190 [id number] endoscope was introduced through the mouth and advanced to the second portion of the duodenum. Without limitations.  The instrument was slowly withdrawn as the mucosa was fully examined.      The upper, middle and distal third of the esophagus were carefully inspected and no abnormalities were noted.  The z-line was well seen at the GEJ.  The endoscope was pushed into the fundus which was normal including a retroflexed view.  The antrum, gastric body, first and second part of the duodenum were unremarkable. Retroflexed views revealed no abnormalities.     The scope was then withdrawn from the patient and the procedure completed.  COMPLICATIONS: There were no complications.  ENDOSCOPIC IMPRESSION: Normal EGD  RECOMMENDATIONS: Proceed with a Colonoscopy.  eSigned:  Jerene Bears, MD 06/29/2014 2:08 PM   CC:The Patient and Lou Miner, MD

## 2014-06-30 ENCOUNTER — Telehealth: Payer: Self-pay | Admitting: *Deleted

## 2014-06-30 NOTE — Telephone Encounter (Signed)
No answer, message left for the patient. 

## 2014-07-02 ENCOUNTER — Encounter: Payer: Self-pay | Admitting: Internal Medicine

## 2014-07-23 ENCOUNTER — Other Ambulatory Visit: Payer: Self-pay | Admitting: Radiology

## 2014-07-23 ENCOUNTER — Other Ambulatory Visit: Payer: Self-pay | Admitting: Emergency Medicine

## 2014-07-23 ENCOUNTER — Ambulatory Visit
Admission: RE | Admit: 2014-07-23 | Discharge: 2014-07-23 | Disposition: A | Discharge status: Home / Self Care | Payer: 59 | Source: Ambulatory Visit

## 2014-07-23 DIAGNOSIS — N6459 Other signs and symptoms in breast: Secondary | ICD-10-CM

## 2014-07-23 DIAGNOSIS — R928 Other abnormal and inconclusive findings on diagnostic imaging of breast: Secondary | ICD-10-CM

## 2014-07-23 DIAGNOSIS — Z1231 Encounter for screening mammogram for malignant neoplasm of breast: Secondary | ICD-10-CM

## 2014-07-29 ENCOUNTER — Ambulatory Visit
Admission: RE | Admit: 2014-07-29 | Discharge: 2014-07-29 | Disposition: A | Discharge status: Home / Self Care | Payer: 59 | Source: Ambulatory Visit | Attending: Emergency Medicine | Admitting: Emergency Medicine

## 2014-07-29 DIAGNOSIS — N6459 Other signs and symptoms in breast: Secondary | ICD-10-CM

## 2014-10-12 ENCOUNTER — Encounter: Payer: Self-pay | Admitting: Internal Medicine

## 2015-09-28 ENCOUNTER — Telehealth: Payer: Self-pay | Admitting: *Deleted

## 2015-09-28 NOTE — Telephone Encounter (Signed)
See my chart message

## 2015-11-08 ENCOUNTER — Encounter: Payer: Self-pay | Admitting: Internal Medicine

## 2016-01-17 ENCOUNTER — Encounter: Payer: Self-pay | Admitting: Nurse Practitioner

## 2016-01-17 ENCOUNTER — Ambulatory Visit (INDEPENDENT_AMBULATORY_CARE_PROVIDER_SITE_OTHER): Payer: 59 | Admitting: Nurse Practitioner

## 2016-01-17 VITALS — BP 120/74 | HR 86 | Temp 98.2°F | Resp 14 | Ht 67.0 in | Wt 236.0 lb

## 2016-01-17 DIAGNOSIS — N870 Mild cervical dysplasia: Secondary | ICD-10-CM | POA: Diagnosis not present

## 2016-01-17 DIAGNOSIS — R768 Other specified abnormal immunological findings in serum: Secondary | ICD-10-CM

## 2016-01-17 DIAGNOSIS — M797 Fibromyalgia: Secondary | ICD-10-CM | POA: Diagnosis not present

## 2016-01-17 DIAGNOSIS — Z7689 Persons encountering health services in other specified circumstances: Secondary | ICD-10-CM

## 2016-01-17 DIAGNOSIS — R7689 Other specified abnormal immunological findings in serum: Secondary | ICD-10-CM

## 2016-01-17 DIAGNOSIS — I7300 Raynaud's syndrome without gangrene: Secondary | ICD-10-CM | POA: Diagnosis not present

## 2016-01-17 DIAGNOSIS — Z7189 Other specified counseling: Secondary | ICD-10-CM

## 2016-01-17 DIAGNOSIS — K219 Gastro-esophageal reflux disease without esophagitis: Secondary | ICD-10-CM | POA: Diagnosis not present

## 2016-01-17 DIAGNOSIS — F329 Major depressive disorder, single episode, unspecified: Secondary | ICD-10-CM

## 2016-01-17 DIAGNOSIS — R635 Abnormal weight gain: Secondary | ICD-10-CM

## 2016-01-17 DIAGNOSIS — F909 Attention-deficit hyperactivity disorder, unspecified type: Secondary | ICD-10-CM

## 2016-01-17 DIAGNOSIS — D6861 Antiphospholipid syndrome: Secondary | ICD-10-CM

## 2016-01-17 DIAGNOSIS — F32A Depression, unspecified: Secondary | ICD-10-CM

## 2016-01-17 MED ORDER — PANTOPRAZOLE SODIUM 40 MG PO TBEC: 40.0000 mg | DELAYED_RELEASE_TABLET | Freq: Every day | ORAL | Status: DC

## 2016-01-17 NOTE — Progress Notes (Signed)
Patient ID: Molly Sanchez, female    DOB: 1971/09/14  Age: 45 y.o. MRN: [id number]  CC: Establish Care   HPI Molly Sanchez presents for Establishing care and CC of refill of medications.  1) New Pt Info:   Immunizations- up-to-date  Mammogram- Dr. Ronita Hipps January 2016  Pap- Jan. 2016, GYN patient has Mirena  Colonoscopy- July 2015 lots of polyps, every 3 years   2) Chronic Problems-  Preservatives- red nose, certain foods trigger inflammatory responses  IUD- regular every 28 days periods 4 days, lighter    3 years into having this  Patient sees Dr. Toy Care for Adderall   Depression, stable sees psychiatry and stable on Pristiq at this time   3) Acute Problems-  Heartburn has been worsened since she has gained weight. She would like to refill the Protonix for a 1 year not taken in over 8 months.  Stress from work- gained 25 lbs  [employer] and bonding with employees have contributed to this gain No formal exercise    History Molly Sanchez has a past medical history of Dysplasia of cervix, low grade (CIN 1) ([id number]); Rosacea; ANA positive; Raynaud's disease; Breast fibroadenoma; Antiphospholipid antibody syndrome complicating pregnancy ([place]); H/O varicella; Antiphospholipid antibody syndrome (HCC) (11/15/2012); GERD (gastroesophageal reflux disease); Clotting disorder ([place]); and Fibromyalgia.   She has past surgical history that includes Cesarean section; Gynecologic cryosurgery; and right breast fibroadenoma removed.   Her family history includes Heart attack in her mother; Hypertension in her mother; Mental illness in her brother and mother; Stroke in her mother. There is no history of Colon cancer, Esophageal cancer, Rectal cancer, Stomach cancer, or Pancreatic cancer.She reports that she has never smoked. She has never used smokeless tobacco. She reports that she drinks about 0.6 oz of alcohol per week. She reports that she does not use illicit drugs.  Outpatient Prescriptions Prior to  Visit  Medication Sig Dispense Refill  . pantoprazole (PROTONIX) 40 MG tablet Take 1 tablet (40 mg total) by mouth daily. (Patient not taking: Reported on 01/17/2016) 90 tablet 3   No facility-administered medications prior to visit.    ROS Review of Systems  Constitutional: Negative for fever, chills, diaphoresis and fatigue.  Respiratory: Negative for chest tightness, shortness of breath and wheezing.   Cardiovascular: Negative for chest pain, palpitations and leg swelling.  Gastrointestinal: Negative for nausea, vomiting and diarrhea.       Heartburn  Skin: Negative for color change and rash.  Neurological: Negative for dizziness, weakness, numbness and headaches.  Psychiatric/Behavioral: Positive for decreased concentration. The patient is not nervous/anxious.     Objective:  BP 120/74 mmHg  Pulse 86  Temp(Src) 98.2 F (36.8 C) (Oral)  Resp 14  Ht 5\' 7"  (1.702 m)  Wt 236 lb (107.049 kg)  BMI 36.95 kg/m2  SpO2 98%  LMP 12/18/2015  Physical Exam  Constitutional: She is oriented to person, place, and time. She appears well-developed and well-nourished. No distress.  HENT:  Head: Normocephalic and atraumatic.  Right Ear: External ear normal.  Left Ear: External ear normal.  Cardiovascular: Normal rate, regular rhythm and normal heart sounds.  Exam reveals no gallop and no friction rub.   No murmur heard. Pulmonary/Chest: Effort normal and breath sounds normal. No respiratory distress. She has no wheezes. She has no rales. She exhibits no tenderness.  Neurological: She is alert and oriented to person, place, and time. No cranial nerve deficit. She exhibits normal muscle tone. Coordination normal.  Skin: Skin is warm  and dry. No rash noted. She is not diaphoretic.  Psychiatric: She has a normal mood and affect. Her behavior is normal. Judgment and thought content normal.   Assessment & Plan:   Molly Sanchez was seen today for establish care.  Diagnoses and all orders for this  visit:  Gastroesophageal reflux disease, esophagitis presence not specified  Raynaud's disease  Fibromyalgia  Dysplasia of cervix, low grade (CIN 1)  Antiphospholipid antibody syndrome (HCC)  ANA positive  Encounter to establish care  Weight gain  Attention deficit hyperactivity disorder (ADHD), unspecified ADHD type  Depression  Other orders -     pantoprazole (PROTONIX) 40 MG tablet; Take 1 tablet (40 mg total) by mouth daily.   I am having Ms. Koors maintain her amphetamine-dextroamphetamine, PRISTIQ, and pantoprazole.  Meds ordered this encounter  Medications  . amphetamine-dextroamphetamine (ADDERALL) 20 MG tablet    Sig:     Refill:  0  . PRISTIQ 50 MG 24 hr tablet    Sig:     Refill:  3  . pantoprazole (PROTONIX) 40 MG tablet    Sig: Take 1 tablet (40 mg total) by mouth daily.    Dispense:  90 tablet    Refill:  3    Order Specific Question:  Supervising Provider    Answer:  Crecencio Mc [[id number]]     Follow-up: Return if symptoms worsen or fail to improve, for CPE.

## 2016-01-17 NOTE — Patient Instructions (Signed)
Set up your physical with fasting labs anytime you wish.   Welcome to [facility]!

## 2016-01-22 DIAGNOSIS — F329 Major depressive disorder, single episode, unspecified: Secondary | ICD-10-CM | POA: Insufficient documentation

## 2016-01-22 DIAGNOSIS — F909 Attention-deficit hyperactivity disorder, unspecified type: Secondary | ICD-10-CM | POA: Insufficient documentation

## 2016-01-22 DIAGNOSIS — Z7689 Persons encountering health services in other specified circumstances: Secondary | ICD-10-CM | POA: Insufficient documentation

## 2016-01-22 DIAGNOSIS — F32A Depression, unspecified: Secondary | ICD-10-CM | POA: Insufficient documentation

## 2016-01-22 DIAGNOSIS — Z0000 Encounter for general adult medical examination without abnormal findings: Secondary | ICD-10-CM | POA: Insufficient documentation

## 2016-01-22 DIAGNOSIS — R635 Abnormal weight gain: Secondary | ICD-10-CM | POA: Insufficient documentation

## 2016-01-22 DIAGNOSIS — K219 Gastro-esophageal reflux disease without esophagitis: Secondary | ICD-10-CM | POA: Insufficient documentation

## 2016-01-22 NOTE — Assessment & Plan Note (Signed)
Stable currently on Adderall 20 mg Followed by a psychiatrist patient will keep her Dr. Harlene Ramus

## 2016-01-22 NOTE — Assessment & Plan Note (Signed)
Found out due to lost pregnancies Patient has had successful births since

## 2016-01-22 NOTE — Assessment & Plan Note (Signed)
Keeping OB/GYN Has Mirena and currently We'll follow as needed

## 2016-01-22 NOTE — Assessment & Plan Note (Signed)
Wt Readings from Last 3 Encounters:  01/17/16 236 lb (107.049 kg)  06/29/14 217 lb (98.431 kg)  03/31/14 217 lb (98.431 kg)   Up since 2015. BMI 36 currently  Patient is aware of contributors Encouraged being mindful about eating and adding in exercise

## 2016-01-22 NOTE — Assessment & Plan Note (Signed)
Stable this time We will follow

## 2016-01-22 NOTE — Assessment & Plan Note (Signed)
Stable at this time Winter makes it worse Patient knows what to do to keep herself warm

## 2016-01-22 NOTE — Assessment & Plan Note (Signed)
Discussed acute and chronic issues. Reviewed health maintenance measures, PFSHx, and immunizations. Obtain records from previous facility.   

## 2016-01-22 NOTE — Assessment & Plan Note (Signed)
Stable at this time We'll follow as needed

## 2016-01-22 NOTE — Assessment & Plan Note (Signed)
Patient stable on Pristiq currently Seeing psychiatrist We'll follow as needed

## 2016-01-22 NOTE — Assessment & Plan Note (Signed)
Uncontrolled new problem to me  Patient has been gaining weight and attributes this to her increasing GERD Stable with Protonix but needs a refill today

## 2016-02-12 ENCOUNTER — Encounter: Payer: Self-pay | Admitting: Nurse Practitioner

## 2016-02-13 ENCOUNTER — Telehealth: Payer: 59 | Admitting: Family

## 2016-02-13 DIAGNOSIS — R6889 Other general symptoms and signs: Secondary | ICD-10-CM

## 2016-02-13 MED ORDER — OSELTAMIVIR PHOSPHATE 75 MG PO CAPS: 75.0000 mg | ORAL_CAPSULE | Freq: Two times a day (BID) | ORAL | Status: DC

## 2016-02-13 NOTE — Progress Notes (Signed)

## 2016-02-14 ENCOUNTER — Other Ambulatory Visit: Payer: Self-pay | Admitting: Nurse Practitioner

## 2016-02-14 DIAGNOSIS — Z0000 Encounter for general adult medical examination without abnormal findings: Secondary | ICD-10-CM

## 2016-02-16 ENCOUNTER — Other Ambulatory Visit: Payer: Self-pay | Admitting: Nurse Practitioner

## 2016-02-17 ENCOUNTER — Ambulatory Visit (INDEPENDENT_AMBULATORY_CARE_PROVIDER_SITE_OTHER): Payer: 59 | Admitting: Nurse Practitioner

## 2016-02-17 ENCOUNTER — Encounter: Payer: Self-pay | Admitting: Nurse Practitioner

## 2016-02-17 VITALS — BP 122/74 | HR 75 | Temp 98.2°F | Resp 14 | Ht 67.0 in | Wt 235.6 lb

## 2016-02-17 DIAGNOSIS — Z1239 Encounter for other screening for malignant neoplasm of breast: Secondary | ICD-10-CM

## 2016-02-17 DIAGNOSIS — Z0000 Encounter for general adult medical examination without abnormal findings: Secondary | ICD-10-CM | POA: Diagnosis not present

## 2016-02-17 LAB — CBC WITH DIFFERENTIAL/PLATELET
BASOS ABS: 0 10*3/uL (ref 0.0–0.1)
BASOS PCT: 0.5 % (ref 0.0–3.0)
EOS ABS: 0.1 10*3/uL (ref 0.0–0.7)
Eosinophils Relative: 1.1 % (ref 0.0–5.0)
HEMATOCRIT: 39.9 % (ref 36.0–46.0)
HEMOGLOBIN: 13.5 g/dL (ref 12.0–15.0)
LYMPHS PCT: 30.8 % (ref 12.0–46.0)
Lymphs Abs: 1.9 10*3/uL (ref 0.7–4.0)
MCHC: 33.9 g/dL (ref 30.0–36.0)
MCV: 91.6 fl (ref 78.0–100.0)
MONO ABS: 0.6 10*3/uL (ref 0.1–1.0)
Monocytes Relative: 9.5 % (ref 3.0–12.0)
NEUTROS ABS: 3.6 10*3/uL (ref 1.4–7.7)
Neutrophils Relative %: 58.1 % (ref 43.0–77.0)
PLATELETS: 249 10*3/uL (ref 150.0–400.0)
RBC: 4.36 Mil/uL (ref 3.87–5.11)
RDW: 13.8 % (ref 11.5–15.5)
WBC: 6.1 10*3/uL (ref 4.0–10.5)

## 2016-02-17 LAB — COMPREHENSIVE METABOLIC PANEL
ALK PHOS: 63 U/L (ref 39–117)
ALT: 16 U/L (ref 0–35)
AST: 18 U/L (ref 0–37)
Albumin: 4 g/dL (ref 3.5–5.2)
BILIRUBIN TOTAL: 1.2 mg/dL (ref 0.2–1.2)
BUN: 9 mg/dL (ref 6–23)
CALCIUM: 9 mg/dL (ref 8.4–10.5)
CO2: 28 meq/L (ref 19–32)
CREATININE: 0.67 mg/dL (ref 0.40–1.20)
Chloride: 104 mEq/L (ref 96–112)
GFR: 122.93 mL/min (ref >60.00)
GLUCOSE: 87 mg/dL (ref 70–99)
Potassium: 4 mEq/L (ref 3.5–5.1)
SODIUM: 140 meq/L (ref 135–145)
TOTAL PROTEIN: 7.1 g/dL (ref 6.0–8.3)

## 2016-02-17 LAB — LIPID PANEL
CHOL/HDL RATIO: 3
Cholesterol: 179 mg/dL (ref 0–200)
HDL: 53.7 mg/dL (ref >39.00)
LDL CALC: 113 mg/dL — AB (ref 0–99)
NonHDL: 125.25
TRIGLYCERIDES: 63 mg/dL (ref 0.0–149.0)
VLDL: 12.6 mg/dL (ref 0.0–40.0)

## 2016-02-17 LAB — HEMOGLOBIN A1C: Hgb A1c MFr Bld: 5.5 % (ref 4.6–6.5)

## 2016-02-17 LAB — TSH: TSH: 1.02 u[IU]/mL (ref 0.35–4.50)

## 2016-02-17 NOTE — Addendum Note (Signed)
Addended by: Rubbie Battiest on: 02/17/2016 10:10 AM   Modules accepted: Level of Service, SmartSet

## 2016-02-17 NOTE — Patient Instructions (Signed)
Health Maintenance, Female Adopting a healthy lifestyle and getting preventive care can go a long way to promote health and wellness. Talk with your health care provider about what schedule of regular examinations is right for you. This is a good chance for you to check in with your provider about disease prevention and staying healthy. In between checkups, there are plenty of things you can do on your own. Experts have done a lot of research about which lifestyle changes and preventive measures are most likely to keep you healthy. Ask your health care provider for more information. WEIGHT AND DIET  Eat a healthy diet  Be sure to include plenty of vegetables, fruits, low-fat dairy products, and lean protein.  Do not eat a lot of foods high in solid fats, added sugars, or salt.  Get regular exercise. This is one of the most important things you can do for your health.  Most adults should exercise for at least 150 minutes each week. The exercise should increase your heart rate and make you sweat (moderate-intensity exercise).  Most adults should also do strengthening exercises at least twice a week. This is in addition to the moderate-intensity exercise.  Maintain a healthy weight  Body mass index (BMI) is a measurement that can be used to identify possible weight problems. It estimates body fat based on height and weight. Your health care provider can help determine your BMI and help you achieve or maintain a healthy weight.  For females 20 years of age and older:   A BMI below 18.5 is considered underweight.  A BMI of 18.5 to 24.9 is normal.  A BMI of 25 to 29.9 is considered overweight.  A BMI of 30 and above is considered obese.  Watch levels of cholesterol and blood lipids  You should start having your blood tested for lipids and cholesterol at 44 years of age, then have this test every 5 years.  You may need to have your cholesterol levels checked more often if:  Your lipid  or cholesterol levels are high.  You are older than 44 years of age.  You are at high risk for heart disease.  CANCER SCREENING   Lung Cancer  Lung cancer screening is recommended for adults 55-80 years old who are at high risk for lung cancer because of a history of smoking.  A yearly low-dose CT scan of the lungs is recommended for people who:  Currently smoke.  Have quit within the past 15 years.  Have at least a 30-pack-year history of smoking. A pack year is smoking an average of one pack of cigarettes a day for 1 year.  Yearly screening should continue until it has been 15 years since you quit.  Yearly screening should stop if you develop a health problem that would prevent you from having lung cancer treatment.  Breast Cancer  Practice breast self-awareness. This means understanding how your breasts normally appear and feel.  It also means doing regular breast self-exams. Let your health care provider know about any changes, no matter how small.  If you are in your 20s or 30s, you should have a clinical breast exam (CBE) by a health care provider every 1-3 years as part of a regular health exam.  If you are 40 or older, have a CBE every year. Also consider having a breast X-ray (mammogram) every year.  If you have a family history of breast cancer, talk to your health care provider about genetic screening.  If you   are at high risk for breast cancer, talk to your health care provider about having an MRI and a mammogram every year.  Breast cancer gene (BRCA) assessment is recommended for women who have family members with BRCA-related cancers. BRCA-related cancers include:  Breast.  Ovarian.  Tubal.  Peritoneal cancers.  Results of the assessment will determine the need for genetic counseling and BRCA1 and BRCA2 testing. Cervical Cancer Your health care provider may recommend that you be screened regularly for cancer of the pelvic organs (ovaries, uterus, and  vagina). This screening involves a pelvic examination, including checking for microscopic changes to the surface of your cervix (Pap test). You may be encouraged to have this screening done every 3 years, beginning at age 21.  For women ages 30-65, health care providers may recommend pelvic exams and Pap testing every 3 years, or they may recommend the Pap and pelvic exam, combined with testing for human papilloma virus (HPV), every 5 years. Some types of HPV increase your risk of cervical cancer. Testing for HPV may also be done on women of any age with unclear Pap test results.  Other health care providers may not recommend any screening for nonpregnant women who are considered low risk for pelvic cancer and who do not have symptoms. Ask your health care provider if a screening pelvic exam is right for you.  If you have had past treatment for cervical cancer or a condition that could lead to cancer, you need Pap tests and screening for cancer for at least 20 years after your treatment. If Pap tests have been discontinued, your risk factors (such as having a new sexual partner) need to be reassessed to determine if screening should resume. Some women have medical problems that increase the chance of getting cervical cancer. In these cases, your health care provider may recommend more frequent screening and Pap tests. Colorectal Cancer  This type of cancer can be detected and often prevented.  Routine colorectal cancer screening usually begins at 44 years of age and continues through 44 years of age.  Your health care provider may recommend screening at an earlier age if you have risk factors for colon cancer.  Your health care provider may also recommend using home test kits to check for hidden blood in the stool.  A small camera at the end of a tube can be used to examine your colon directly (sigmoidoscopy or colonoscopy). This is done to check for the earliest forms of colorectal  cancer.  Routine screening usually begins at age 50.  Direct examination of the colon should be repeated every 5-10 years through 44 years of age. However, you may need to be screened more often if early forms of precancerous polyps or small growths are found. Skin Cancer  Check your skin from head to toe regularly.  Tell your health care provider about any new moles or changes in moles, especially if there is a change in a mole's shape or color.  Also tell your health care provider if you have a mole that is larger than the size of a pencil eraser.  Always use sunscreen. Apply sunscreen liberally and repeatedly throughout the day.  Protect yourself by wearing long sleeves, pants, a wide-brimmed hat, and sunglasses whenever you are outside. HEART DISEASE, DIABETES, AND HIGH BLOOD PRESSURE   High blood pressure causes heart disease and increases the risk of stroke. High blood pressure is more likely to develop in:  People who have blood pressure in the high end   of the normal range (130-139/85-89 mm Hg).  People who are overweight or obese.  People who are African American.  If you are 38-23 years of age, have your blood pressure checked every 3-5 years. If you are 61 years of age or older, have your blood pressure checked every year. You should have your blood pressure measured twice--once when you are at a hospital or clinic, and once when you are not at a hospital or clinic. Record the average of the two measurements. To check your blood pressure when you are not at a hospital or clinic, you can use:  An automated blood pressure machine at a pharmacy.  A home blood pressure monitor.  If you are between 45 years and 39 years old, ask your health care provider if you should take aspirin to prevent strokes.  Have regular diabetes screenings. This involves taking a blood sample to check your fasting blood sugar level.  If you are at a normal weight and have a low risk for diabetes,  have this test once every three years after 44 years of age.  If you are overweight and have a high risk for diabetes, consider being tested at a younger age or more often. PREVENTING INFECTION  Hepatitis B  If you have a higher risk for hepatitis B, you should be screened for this virus. You are considered at high risk for hepatitis B if:  You were born in a country where hepatitis B is common. Ask your health care provider which countries are considered high risk.  Your parents were born in a high-risk country, and you have not been immunized against hepatitis B (hepatitis B vaccine).  You have HIV or AIDS.  You use needles to inject street drugs.  You live with someone who has hepatitis B.  You have had sex with someone who has hepatitis B.  You get hemodialysis treatment.  You take certain medicines for conditions, including cancer, organ transplantation, and autoimmune conditions. Hepatitis C  Blood testing is recommended for:  Everyone born from [birth year] through 1964.  Anyone with known risk factors for hepatitis C. Sexually transmitted infections (STIs)  You should be screened for sexually transmitted infections (STIs) including gonorrhea and chlamydia if:  You are sexually active and are younger than 44 years of age.  You are older than 44 years of age and your health care provider tells you that you are at risk for this type of infection.  Your sexual activity has changed since you were last screened and you are at an increased risk for chlamydia or gonorrhea. Ask your health care provider if you are at risk.  If you do not have HIV, but are at risk, it may be recommended that you take a prescription medicine daily to prevent HIV infection. This is called pre-exposure prophylaxis (PrEP). You are considered at risk if:  You are sexually active and do not regularly use condoms or know the HIV status of your partner(s).  You take drugs by injection.  You are sexually  active with a partner who has HIV. Talk with your health care provider about whether you are at high risk of being infected with HIV. If you choose to begin PrEP, you should first be tested for HIV. You should then be tested every 3 months for as long as you are taking PrEP.  PREGNANCY   If you are premenopausal and you may become pregnant, ask your health care provider about preconception counseling.  If you may  become pregnant, take 400 to 800 micrograms (mcg) of folic acid every day.  If you want to prevent pregnancy, talk to your health care provider about birth control (contraception). OSTEOPOROSIS AND MENOPAUSE   Osteoporosis is a disease in which the bones lose minerals and strength with aging. This can result in serious bone fractures. Your risk for osteoporosis can be identified using a bone density scan.  If you are 61 years of age or older, or if you are at risk for osteoporosis and fractures, ask your health care provider if you should be screened.  Ask your health care provider whether you should take a calcium or vitamin D supplement to lower your risk for osteoporosis.  Menopause may have certain physical symptoms and risks.  Hormone replacement therapy may reduce some of these symptoms and risks. Talk to your health care provider about whether hormone replacement therapy is right for you.  HOME CARE INSTRUCTIONS   Schedule regular health, dental, and eye exams.  Stay current with your immunizations.   Do not use any tobacco products including cigarettes, chewing tobacco, or electronic cigarettes.  If you are pregnant, do not drink alcohol.  If you are breastfeeding, limit how much and how often you drink alcohol.  Limit alcohol intake to no more than 1 drink per day for nonpregnant women. One drink equals 12 ounces of beer, 5 ounces of wine, or 1 ounces of hard liquor.  Do not use street drugs.  Do not share needles.  Ask your health care provider for help if  you need support or information about quitting drugs.  Tell your health care provider if you often feel depressed.  Tell your health care provider if you have ever been abused or do not feel safe at home.   This information is not intended to replace advice given to you by your health care provider. Make sure you discuss any questions you have with your health care provider.   Document Released: 06/12/2011 Document Revised: 12/18/2014 Document Reviewed: 10/29/2013 Elsevier Interactive Patient Education [employer].

## 2016-02-17 NOTE — Progress Notes (Signed)
Patient ID: Molly Sanchez, female    DOB: 08/24/1971  Age: 45 y.o. MRN: [id number]  CC: Annual Exam   HPI Molly Sanchez presents for Annual Exam.   1) Annual Physical   Diet- No changes  Exercise- No changes   Immunizations-    Flu- UTD   Tdap- UTD  Mammogram- Needs order  Eye Exam- Never been   Dental Exam- Braces, UTD   LMP- Keeping OB/GYN has IUD  Labs- Fasting  Fall- Neg.   Depression- Neg.  Refills: Denies    History Molly Sanchez has a past medical history of Dysplasia of cervix, low grade (CIN 1) ([id number]); Rosacea; ANA positive; Raynaud's disease; Breast fibroadenoma; Antiphospholipid antibody syndrome complicating pregnancy ([facility]); H/O varicella; Antiphospholipid antibody syndrome ([facility]) (11/15/2012); GERD (gastroesophageal reflux disease); Clotting disorder ([place]); and Fibromyalgia.   She has past surgical history that includes Cesarean section; Gynecologic cryosurgery; and right breast fibroadenoma removed.   Her family history includes Heart attack in her mother; Hypertension in her mother; Mental illness in her brother and mother; Stroke in her mother. There is no history of Colon cancer, Esophageal cancer, Rectal cancer, Stomach cancer, or Pancreatic cancer.She reports that she has never smoked. She has never used smokeless tobacco. She reports that she drinks about 0.6 oz of alcohol per week. She reports that she does not use illicit drugs.  Outpatient Prescriptions Prior to Visit  Medication Sig Dispense Refill  . amphetamine-dextroamphetamine (ADDERALL) 20 MG tablet   0  . oseltamivir (TAMIFLU) 75 MG capsule Take 1 capsule (75 mg total) by mouth 2 (two) times daily. 10 capsule 0  . pantoprazole (PROTONIX) 40 MG tablet Take 1 tablet (40 mg total) by mouth daily. 90 tablet 3  . PRISTIQ 50 MG 24 hr tablet   3   No facility-administered medications prior to visit.    ROS Review of Systems  Constitutional: Negative for fever, chills, diaphoresis, fatigue and unexpected  weight change.  HENT: Negative for tinnitus and trouble swallowing.   Eyes: Negative for visual disturbance.  Respiratory: Negative for chest tightness, shortness of breath and wheezing.   Cardiovascular: Negative for chest pain, palpitations and leg swelling.  Gastrointestinal: Negative for nausea, vomiting, abdominal pain, diarrhea, constipation and blood in stool.  Endocrine: Negative for polydipsia, polyphagia and polyuria.  Genitourinary: Negative for dysuria, hematuria, vaginal discharge and vaginal pain.  Musculoskeletal: Negative for myalgias, back pain, arthralgias and gait problem.  Skin: Negative for color change and rash.  Neurological: Negative for dizziness, weakness, numbness and headaches.  Hematological: Does not bruise/bleed easily.  Psychiatric/Behavioral: Negative for suicidal ideas and sleep disturbance. The patient is not nervous/anxious.     Objective:  BP 122/74 mmHg  Pulse 75  Temp(Src) 98.2 F (36.8 C) (Oral)  Resp 14  Ht 5\' 7"  (1.702 m)  Wt 235 lb 9.6 oz (106.867 kg)  BMI 36.89 kg/m2  SpO2 99%  Physical Exam  Constitutional: She is oriented to person, place, and time. She appears well-developed and well-nourished. No distress.  HENT:  Head: Normocephalic and atraumatic.  Right Ear: External ear normal.  Left Ear: External ear normal.  Nose: Nose normal.  Mouth/Throat: Oropharynx is clear and moist. No oropharyngeal exudate.  TMs and canals clear bilaterally  Eyes: Conjunctivae and EOM are normal. Pupils are equal, round, and reactive to light. Right eye exhibits no discharge. Left eye exhibits no discharge. No scleral icterus.  Neck: Normal range of motion. Neck supple. No thyromegaly present.  Cardiovascular: Normal rate, regular rhythm, normal  heart sounds and intact distal pulses.  Exam reveals no gallop and no friction rub.   No murmur heard. Pulmonary/Chest: Effort normal and breath sounds normal. No respiratory distress. She has no wheezes. She  has no rales. She exhibits no tenderness.  Clinical breast exam- dense breast tissue, well healed right breast scar at 10-11 o'clock for previous biopsy  Abdominal: Soft. Bowel sounds are normal. She exhibits no distension and no mass. There is no tenderness. There is no rebound and no guarding.  Musculoskeletal: Normal range of motion. She exhibits no edema or tenderness.  Lymphadenopathy:    She has no cervical adenopathy.  Neurological: She is alert and oriented to person, place, and time. She has normal reflexes. No cranial nerve deficit. She exhibits normal muscle tone. Coordination normal.  Skin: Skin is warm and dry. No rash noted. She is not diaphoretic. No erythema. No pallor.  Psychiatric: She has a normal mood and affect. Her behavior is normal. Judgment and thought content normal.   Assessment & Plan:   Molly Sanchez was seen today for annual exam.  Diagnoses and all orders for this visit:  Breast screening -     MM Digital Screening; Future  Routine general medical examination at a health care facility   I am having Molly Sanchez maintain her amphetamine-dextroamphetamine, PRISTIQ, pantoprazole, and oseltamivir.  No orders of the defined types were placed in this encounter.     Follow-up: Return in about 1 year (around 02/16/2017) for CPE w/ labs.

## 2016-02-17 NOTE — Addendum Note (Signed)
Addended by: Karlene Einstein D on: 02/17/2016 10:18 AM   Modules accepted: Orders

## 2016-02-17 NOTE — Assessment & Plan Note (Signed)
Discussed acute and chronic issues. Reviewed health maintenance measures, PFSHx, and immunizations. Obtain routine labs TSH, Lipid panel, CBC w/ diff, A1c, and CMET today.   Breast exam without significant findings  PAP- OB/GYN  UTD on other health maintanence

## 2016-03-06 ENCOUNTER — Other Ambulatory Visit: Payer: Self-pay

## 2016-03-06 DIAGNOSIS — Z1231 Encounter for screening mammogram for malignant neoplasm of breast: Secondary | ICD-10-CM

## 2016-03-15 ENCOUNTER — Ambulatory Visit
Admission: RE | Admit: 2016-03-15 | Discharge: 2016-03-15 | Disposition: A | Discharge status: Home / Self Care | Payer: 59 | Source: Ambulatory Visit

## 2016-03-15 DIAGNOSIS — Z1231 Encounter for screening mammogram for malignant neoplasm of breast: Secondary | ICD-10-CM

## 2016-04-22 DIAGNOSIS — F900 Attention-deficit hyperactivity disorder, predominantly inattentive type: Secondary | ICD-10-CM | POA: Diagnosis not present

## 2016-04-22 DIAGNOSIS — F3342 Major depressive disorder, recurrent, in full remission: Secondary | ICD-10-CM | POA: Diagnosis not present

## 2016-05-07 ENCOUNTER — Telehealth: Payer: 59 | Admitting: Family

## 2016-05-07 DIAGNOSIS — M545 Low back pain, unspecified: Secondary | ICD-10-CM

## 2016-05-07 MED ORDER — ETODOLAC 300 MG PO CAPS: 300.0000 mg | ORAL_CAPSULE | Freq: Three times a day (TID) | ORAL | Status: DC

## 2016-05-07 MED ORDER — BACLOFEN 10 MG PO TABS: 10.0000 mg | ORAL_TABLET | Freq: Three times a day (TID) | ORAL | Status: DC | PRN

## 2016-05-07 NOTE — Progress Notes (Signed)

## 2016-05-24 ENCOUNTER — Ambulatory Visit: Payer: Self-pay | Admitting: Physician Assistant

## 2016-05-24 ENCOUNTER — Encounter: Payer: Self-pay | Admitting: Physician Assistant

## 2016-05-24 VITALS — BP 108/80 | HR 78 | Temp 98.8°F

## 2016-05-24 DIAGNOSIS — S63501A Unspecified sprain of right wrist, initial encounter: Secondary | ICD-10-CM

## 2016-05-24 DIAGNOSIS — S63601A Unspecified sprain of right thumb, initial encounter: Secondary | ICD-10-CM

## 2016-05-24 NOTE — Progress Notes (Signed)
S: c/o wrist and thumb pain, states she was working out using pull down machine, had wrist bent instead of straight, started having pain about 2 hours after using, pain increased with movement and is waking her from sleep, no swelling  O: vitals wnl, nad, r wrist neg for bony tenderness, full rom, thumb a little tender with Finkelstein's maneuver, n/v intact  A: sprain, tenosynovitis  P: buy otc thumb spica splint, use ice, wear splint especially at night, use etodolac, if not better in 7 -10 days return for reeval

## 2016-06-11 ENCOUNTER — Telehealth: Payer: 59 | Admitting: Family

## 2016-06-11 DIAGNOSIS — J069 Acute upper respiratory infection, unspecified: Secondary | ICD-10-CM

## 2016-06-11 MED ORDER — BENZONATATE 100 MG PO CAPS: 100.0000 mg | ORAL_CAPSULE | Freq: Three times a day (TID) | ORAL | Status: DC | PRN

## 2016-06-11 MED ORDER — AZITHROMYCIN 250 MG PO TABS: ORAL_TABLET | ORAL | Status: DC

## 2016-06-11 NOTE — Progress Notes (Signed)

## 2016-07-04 ENCOUNTER — Ambulatory Visit (INDEPENDENT_AMBULATORY_CARE_PROVIDER_SITE_OTHER): Payer: 59 | Admitting: Family

## 2016-07-04 ENCOUNTER — Encounter: Payer: Self-pay | Admitting: Family

## 2016-07-04 VITALS — BP 108/80 | HR 86 | Temp 98.5°F | Wt 242.0 lb

## 2016-07-04 DIAGNOSIS — R5383 Other fatigue: Secondary | ICD-10-CM

## 2016-07-04 DIAGNOSIS — R768 Other specified abnormal immunological findings in serum: Secondary | ICD-10-CM | POA: Diagnosis not present

## 2016-07-04 LAB — CBC WITH DIFFERENTIAL/PLATELET
BASOS ABS: 0 10*3/uL (ref 0.0–0.1)
Basophils Relative: 0.6 % (ref 0.0–3.0)
Eosinophils Absolute: 0.1 10*3/uL (ref 0.0–0.7)
Eosinophils Relative: 1.8 % (ref 0.0–5.0)
HEMATOCRIT: 40.7 % (ref 36.0–46.0)
Hemoglobin: 13.6 g/dL (ref 12.0–15.0)
LYMPHS PCT: 21.1 % (ref 12.0–46.0)
Lymphs Abs: 1.5 10*3/uL (ref 0.7–4.0)
MCHC: 33.3 g/dL (ref 30.0–36.0)
MCV: 90.8 fl (ref 78.0–100.0)
MONOS PCT: 8.7 % (ref 3.0–12.0)
Monocytes Absolute: 0.6 10*3/uL (ref 0.1–1.0)
NEUTROS ABS: 4.9 10*3/uL (ref 1.4–7.7)
Neutrophils Relative %: 67.8 % (ref 43.0–77.0)
PLATELETS: 262 10*3/uL (ref 150.0–400.0)
RBC: 4.48 Mil/uL (ref 3.87–5.11)
RDW: 13.6 % (ref 11.5–15.5)
WBC: 7.2 10*3/uL (ref 4.0–10.5)

## 2016-07-04 LAB — POCT URINALYSIS DIPSTICK
Glucose, UA: NEGATIVE
KETONES UA: NEGATIVE
LEUKOCYTES UA: NEGATIVE
NITRITE UA: NEGATIVE
PH UA: 5.5
PROTEIN UA: 30
Spec Grav, UA: >=1.03
Urobilinogen, UA: 1

## 2016-07-04 LAB — URINALYSIS, MICROSCOPIC ONLY

## 2016-07-04 LAB — COMPREHENSIVE METABOLIC PANEL
ALK PHOS: 64 U/L (ref 39–117)
ALT: 10 U/L (ref 0–35)
AST: 15 U/L (ref 0–37)
Albumin: 3.9 g/dL (ref 3.5–5.2)
BILIRUBIN TOTAL: 0.9 mg/dL (ref 0.2–1.2)
BUN: 8 mg/dL (ref 6–23)
CALCIUM: 9 mg/dL (ref 8.4–10.5)
CO2: 28 meq/L (ref 19–32)
Chloride: 106 mEq/L (ref 96–112)
Creatinine, Ser: 0.78 mg/dL (ref 0.40–1.20)
GFR: 102.98 mL/min (ref >60.00)
Glucose, Bld: 88 mg/dL (ref 70–99)
Potassium: 4 mEq/L (ref 3.5–5.1)
Sodium: 140 mEq/L (ref 135–145)
TOTAL PROTEIN: 7 g/dL (ref 6.0–8.3)

## 2016-07-04 LAB — C-REACTIVE PROTEIN: CRP: 1.3 mg/dL (ref 0.5–20.0)

## 2016-07-04 LAB — SEDIMENTATION RATE: SED RATE: 23 mm/h — AB (ref 0–20)

## 2016-07-04 LAB — TSH: TSH: 2.54 u[IU]/mL (ref 0.35–4.50)

## 2016-07-04 NOTE — Patient Instructions (Signed)
Lab work.   If ANA remains positive, will refer to rheum. Lets wait on results first.   If there is no improvement in your symptoms, or if there is any worsening of symptoms, or if you have any additional concerns, please return for re-evaluation; or, if we are closed, consider going to the Emergency Room for evaluation if symptoms urgent.   Fatigue Fatigue is feeling tired all of the time, a lack of energy, or a lack of motivation. Occasional or mild fatigue is often a normal response to activity or life in general. However, long-lasting (chronic) or extreme fatigue may indicate an underlying medical condition. HOME CARE INSTRUCTIONS  Watch your fatigue for any changes. The following actions may help to lessen any discomfort you are feeling:  Talk to your health care provider about how much sleep you need each night. Try to get the required amount every night.  Take medicines only as directed by your health care provider.  Eat a healthy and nutritious diet. Ask your health care provider if you need help changing your diet.  Drink enough fluid to keep your urine clear or pale yellow.  Practice ways of relaxing, such as yoga, meditation, massage therapy, or acupuncture.  Exercise regularly.   Change situations that cause you stress. Try to keep your work and personal routine reasonable.  Do not abuse illegal drugs.  Limit alcohol intake to no more than 1 drink per day for nonpregnant women and 2 drinks per day for men. One drink equals 12 ounces of beer, 5 ounces of wine, or 1 ounces of hard liquor.  Take a multivitamin, if directed by your health care provider. SEEK MEDICAL CARE IF:   Your fatigue does not get better.  You have a fever.   You have unintentional weight loss or gain.  You have headaches.   You have difficulty:   Falling asleep.  Sleeping throughout the night.  You feel angry, guilty, anxious, or sad.   You are unable to have a bowel movement  (constipation).   You skin is dry.   Your legs or another part of your body is swollen.  SEEK IMMEDIATE MEDICAL CARE IF:   You feel confused.   Your vision is blurry.  You feel faint or pass out.   You have a severe headache.   You have severe abdominal, pelvic, or back pain.   You have chest pain, shortness of breath, or an irregular or fast heartbeat.   You are unable to urinate or you urinate less than normal.   You develop abnormal bleeding, such as bleeding from the rectum, vagina, nose, lungs, or nipples.  You vomit blood.   You have thoughts about harming yourself or committing suicide.   You are worried that you might harm someone else.    This information is not intended to replace advice given to you by your health care provider. Make sure you discuss any questions you have with your health care provider.   Document Released: 09/24/2007 Document Revised: 12/18/2014 Document Reviewed: 03/31/2014 Elsevier Interactive Patient Education [employer].

## 2016-07-04 NOTE — Progress Notes (Signed)
Subjective:    Patient ID: Molly Sanchez, female    DOB: 1971-03-15, 45 y.o.   MRN: [id number]  CC: RAENAH COLANTONIO is a 45 y.o. female who presents today for an acute visit.    HPI: Patient here for evaluation of fatigue for past 2 months, unchanged. Has chronic joint pain and fibromyalgia. Lately more notable pain in bilateral wrists and knees. No swelling. She notes she sprained her wrist while working out.  Sleep hasnt changed- getting normal 6 hours of sleep. Has two small children. Has started working out these past 2 months. Depression well controlled on Pristiq. Endorses weight gain and 'eating horribly'. Fatigue improves with exercise. Fatigue doesn't improve after good nights rest or with adderall.  H/o of ANA positive. Mother had RA. Has been seen by rheumatologist whom diagnosed her with fibromyalgia.    HISTORY:  Past Medical History:  Diagnosis Date  . ANA positive   . Antiphospholipid antibody syndrome ([place]) 11/15/2012  . Antiphospholipid antibody syndrome complicating pregnancy ([place])   . Breast fibroadenoma    history of  . Clotting disorder ([place])    with pregancy  . Dysplasia of cervix, low grade (CIN 1) [id number]  . Fibromyalgia   . GERD (gastroesophageal reflux disease)   . H/O varicella   . Raynaud's disease   . Rosacea    Past Surgical History:  Procedure Laterality Date  . CESAREAN SECTION    . GYNECOLOGIC CRYOSURGERY     of cervix  . right breast fibroadenoma removed     Family History  Problem Relation Age of Onset  . Hypertension Mother   . Stroke Mother   . Mental illness Mother     schizophrenia  . Heart attack Mother   . Mental illness Brother     schizophrenia  . Colon cancer Neg Hx   . Esophageal cancer Neg Hx   . Rectal cancer Neg Hx   . Stomach cancer Neg Hx   . Pancreatic cancer Neg Hx     Allergies: Review of patient's allergies indicates no known allergies. Current Outpatient Prescriptions on File Prior to Visit  Medication Sig  Dispense Refill  . amphetamine-dextroamphetamine (ADDERALL) 20 MG tablet   0  . baclofen (LIORESAL) 10 MG tablet Take 1 tablet (10 mg total) by mouth 3 (three) times daily as needed for muscle spasms. 30 each 0  . PRISTIQ 50 MG 24 hr tablet   3   No current facility-administered medications on file prior to visit.     Social History  Substance Use Topics  . Smoking status: Never Smoker  . Smokeless tobacco: Never Used  . Alcohol use 0.6 oz/week    1 Glasses of wine per week     Comment: rare    Review of Systems  Constitutional: Positive for fatigue. Negative for activity change, appetite change, chills, fever and unexpected weight change.  Respiratory: Negative for cough.   Cardiovascular: Negative for chest pain, palpitations and leg swelling.  Gastrointestinal: Negative for nausea and vomiting.  Musculoskeletal: Positive for arthralgias. Negative for joint swelling.  Psychiatric/Behavioral: Negative for sleep disturbance.      Objective:    BP 108/80   Pulse 86   Temp 98.5 F (36.9 C) (Oral)   Wt 242 lb (109.8 kg)   SpO2 97%   BMI 37.90 kg/m    Physical Exam  Constitutional: She appears well-developed and well-nourished.  Eyes: Conjunctivae are normal.  Neck: No thyroid mass and no thyromegaly present.  Cardiovascular: Normal rate, regular rhythm, normal heart sounds and normal pulses.   Pulmonary/Chest: Effort normal and breath sounds normal. She has no wheezes. She has no rhonchi. She has no rales.  Musculoskeletal:       Right wrist: Normal.  Neurological: She is alert.  Skin: Skin is warm and dry.  Psychiatric: She has a normal mood and affect. Her speech is normal and behavior is normal. Thought content normal.  Vitals reviewed.      Assessment & Plan:   1. Other fatigue Nonspecific at this time. Suspect chronic fatigue versus autoimmune. Pending lab work.   - CBC with Differential/Platelet; Future - Comprehensive metabolic panel; Future - TSH;  Future - POCT urinalysis dipstick - ANA w/Reflex if Positive; Future - C-reactive protein; Future - CYCLIC CITRUL PEPTIDE ANTIBODY, IGG/IGA; Future - Rheumatoid factor; Future - Sedimentation rate; Future  2. ANA positive Family history of RA. Pending evaluation. Will refer to rheumatology if positive results.   - ANA w/Reflex if Positive; Future - C-reactive protein; Future - CYCLIC CITRUL PEPTIDE ANTIBODY, IGG/IGA; Future - Rheumatoid factor; Future - Sedimentation rate; Future    I have discontinued Ms. Sundberg's pantoprazole, oseltamivir, etodolac, azithromycin, and benzonatate. I am also having her maintain her amphetamine-dextroamphetamine, PRISTIQ, and baclofen.   No orders of the defined types were placed in this encounter.   Return precautions given.   Risks, benefits, and alternatives of the medications and treatment plan prescribed today were discussed, and patient expressed understanding.   Education regarding symptom management and diagnosis given to patient on AVS.  Continue to follow with Mable Paris, FNP for routine health maintenance.   Molly Sanchez and I agreed with plan.   Mable Paris, FNP

## 2016-07-05 LAB — RHEUMATOID FACTOR: RHEUMATOID FACTOR: 22 [IU]/mL — AB (ref <=14)

## 2016-07-06 ENCOUNTER — Encounter: Payer: Self-pay | Admitting: Family

## 2016-07-06 DIAGNOSIS — R768 Other specified abnormal immunological findings in serum: Secondary | ICD-10-CM

## 2016-07-06 LAB — CYCLIC CITRUL PEPTIDE ANTIBODY, IGG/IGA: CYCLIC CITRULLIN PEPTIDE AB: 8 U (ref 0–19)

## 2016-07-06 LAB — URINE CULTURE: Organism ID, Bacteria: NO GROWTH

## 2016-07-06 LAB — ANA W/REFLEX IF POSITIVE: ANA: NEGATIVE

## 2016-07-07 DIAGNOSIS — R768 Other specified abnormal immunological findings in serum: Secondary | ICD-10-CM | POA: Insufficient documentation

## 2016-07-07 NOTE — Telephone Encounter (Signed)
See result note.  

## 2016-07-08 ENCOUNTER — Encounter: Payer: Self-pay | Admitting: Family

## 2016-07-08 DIAGNOSIS — B3731 Acute candidiasis of vulva and vagina: Secondary | ICD-10-CM

## 2016-07-08 DIAGNOSIS — B373 Candidiasis of vulva and vagina: Secondary | ICD-10-CM

## 2016-07-10 MED ORDER — FLUCONAZOLE 150 MG PO TABS: 150.0000 mg | ORAL_TABLET | Freq: Once | ORAL | 1 refills | Status: AC

## 2016-07-10 NOTE — Telephone Encounter (Signed)
Sent in rx after receiving email

## 2016-09-01 MED FILL — DEXTROAMP-AMPHETAMIN 20 MG: 20 | 90 days supply | Qty: 360 | Fill #0

## 2016-09-26 ENCOUNTER — Encounter: Payer: Self-pay | Admitting: Family

## 2016-11-13 ENCOUNTER — Encounter (INDEPENDENT_AMBULATORY_CARE_PROVIDER_SITE_OTHER): Payer: 59 | Admitting: Family Medicine

## 2016-11-15 ENCOUNTER — Telehealth: Payer: 59 | Admitting: Family

## 2016-11-15 DIAGNOSIS — J019 Acute sinusitis, unspecified: Secondary | ICD-10-CM | POA: Diagnosis not present

## 2016-11-15 MED ORDER — AMOXICILLIN-POT CLAVULANATE 875-125 MG PO TABS: 1.0000 | ORAL_TABLET | Freq: Two times a day (BID) | ORAL | 0 refills | Status: DC

## 2016-11-15 NOTE — Progress Notes (Signed)

## 2016-11-20 DIAGNOSIS — Z1151 Encounter for screening for human papillomavirus (HPV): Secondary | ICD-10-CM | POA: Diagnosis not present

## 2016-11-20 DIAGNOSIS — Z6837 Body mass index (BMI) 37.0-37.9, adult: Secondary | ICD-10-CM | POA: Diagnosis not present

## 2016-11-20 DIAGNOSIS — Z01419 Encounter for gynecological examination (general) (routine) without abnormal findings: Secondary | ICD-10-CM | POA: Diagnosis not present

## 2016-11-28 ENCOUNTER — Ambulatory Visit (INDEPENDENT_AMBULATORY_CARE_PROVIDER_SITE_OTHER): Payer: 59 | Admitting: Family Medicine

## 2016-11-28 ENCOUNTER — Encounter (INDEPENDENT_AMBULATORY_CARE_PROVIDER_SITE_OTHER): Payer: Self-pay | Admitting: Family Medicine

## 2016-11-28 VITALS — BP 109/67 | HR 83 | Temp 98.3°F | Resp 14 | Ht 66.0 in | Wt 237.8 lb

## 2016-11-28 DIAGNOSIS — R632 Polyphagia: Secondary | ICD-10-CM

## 2016-11-28 DIAGNOSIS — Z6838 Body mass index (BMI) 38.0-38.9, adult: Secondary | ICD-10-CM

## 2016-11-28 DIAGNOSIS — R5383 Other fatigue: Secondary | ICD-10-CM | POA: Diagnosis not present

## 2016-11-28 DIAGNOSIS — Z9189 Other specified personal risk factors, not elsewhere classified: Secondary | ICD-10-CM

## 2016-11-28 DIAGNOSIS — E669 Obesity, unspecified: Secondary | ICD-10-CM

## 2016-11-28 DIAGNOSIS — Z1389 Encounter for screening for other disorder: Secondary | ICD-10-CM | POA: Diagnosis not present

## 2016-11-28 DIAGNOSIS — E559 Vitamin D deficiency, unspecified: Secondary | ICD-10-CM

## 2016-11-28 DIAGNOSIS — R0609 Other forms of dyspnea: Secondary | ICD-10-CM

## 2016-11-28 DIAGNOSIS — Z1331 Encounter for screening for depression: Secondary | ICD-10-CM

## 2016-11-28 NOTE — Progress Notes (Signed)
Office: [phone]  /  Fax: [phone]   HPI:   Chief Complaint: OBESITY  Molly Sanchez (MR# [id number]) is a 45 y.o. female who presents on 11/28/2016 for obesity evaluation and treatment. Current BMI is Body mass index is 38.38 kg/m.Molly Sanchez has struggled with obesity for years and has been unsuccessful in either losing weight or maintaining long term weight loss. Molly Sanchez attended our information session and states she is currently in the action stage of change and ready to dedicate time achieving and maintaining a healthier weight.  Molly Sanchez states she has significant food cravings issues  she snacks frequently in the evenings she has problems with excessive hunger  she has binge eating behaviors she struggles with emotional eating    Fatigue Molly Sanchez feels her energy is lower than it should be. This has worsened with weight gain and has not worsened recently. Molly Sanchez admits to daytime somnolence and  reports waking up still tired. Patient is at risk for obstructive sleep apnea. Patent has a history of symptoms of daytime fatigue. Patient generally gets 5 or 6 hours of sleep per night, and states they generally have nightime awakenings. Snoring is present. Apneic episodes are not present. Epworth Sleepiness Score is 10  Dyspnea on exertion [name] notes increasing shortness of breath with exercising and seems to be worsening over time with weight gain. She notes getting out of breath sooner with activity than she used to. This has not gotten worse recently.  Vit d deficiency Molly Sanchez has a hx of vit D deficiency. + fatigue, due for lab check.  Depression Screen Molly Sanchez's Food and Mood (modified PHQ-9) score was  Depression screen PHQ 2/9 11/28/2016  Decreased Interest 3  Down, Depressed, Hopeless 1  PHQ - 2 Score 4  Altered sleeping 1  Tired, decreased energy 3  Change in appetite 3  Feeling bad or failure about yourself  3  Trouble concentrating 3  Moving slowly or fidgety/restless  2  Suicidal thoughts 0  PHQ-9 Score 19    ALLERGIES: No Known Allergies  MEDICATIONS: Current Outpatient Prescriptions on File Prior to Visit  Medication Sig Dispense Refill  . amphetamine-dextroamphetamine (ADDERALL) 20 MG tablet   0  . baclofen (LIORESAL) 10 MG tablet Take 1 tablet (10 mg total) by mouth 3 (three) times daily as needed for muscle spasms. (Patient not taking: Reported on 11/28/2016) 30 each 0  . PRISTIQ 50 MG 24 hr tablet   3   No current facility-administered medications on file prior to visit.     PAST MEDICAL HISTORY: Past Medical History:  Diagnosis Date  . ADD (attention deficit disorder)   . ANA positive   . Antiphospholipid antibody syndrome ([place]) 11/15/2012  . Antiphospholipid antibody syndrome complicating pregnancy ([place])   . Back pain   . Breast fibroadenoma    history of  . Clotting disorder ([place])    with pregancy  . Depression   . Dysplasia of cervix, low grade (CIN 1) [id number]  . Fatigue   . Fibromyalgia   . GERD (gastroesophageal reflux disease)   . GERD (gastroesophageal reflux disease)   . H/O varicella   . Hx of blood clots    During pregnancy  . Joint pain   . Raynaud's disease   . Rosacea   . Vitamin D deficiency     PAST SURGICAL HISTORY: Past Surgical History:  Procedure Laterality Date  . CESAREAN SECTION    . GYNECOLOGIC CRYOSURGERY     of cervix  . right breast  fibroadenoma removed      SOCIAL HISTORY: Social History  Substance Use Topics  . Smoking status: Never Smoker  . Smokeless tobacco: Never Used  . Alcohol use 0.6 oz/week    1 Glasses of wine per week     Comment: rare    FAMILY HISTORY: Family History  Problem Relation Age of Onset  . Hypertension Mother   . Stroke Mother   . Mental illness Mother     schizophrenia  . Heart attack Mother   . Diabetes Mother   . Hyperlipidemia Mother   . Heart disease Mother   . Depression Mother   . Anxiety disorder Mother   . Schizophrenia Mother   .  Obesity Mother   . Mental illness Brother     schizophrenia  . Colon cancer Neg Hx   . Esophageal cancer Neg Hx   . Rectal cancer Neg Hx   . Stomach cancer Neg Hx   . Pancreatic cancer Neg Hx     ROS: Review of Systems  Constitutional: Positive for malaise/fatigue.  Cardiovascular:       Very Cold feet or hands  Gastrointestinal: Positive for nausea.  Musculoskeletal: Positive for back pain and joint pain.       Muscle Stiffness    PHYSICAL EXAM: Blood pressure 109/67, pulse 83, temperature 98.3 F (36.8 C), temperature source Oral, resp. rate 14, height 5\' 6"  (1.676 m), weight 237 lb 12.8 oz (107.9 kg), SpO2 100 %. Body mass index is 38.38 kg/m. Physical Exam  Constitutional: She is oriented to person, place, and time. She appears well-developed and well-nourished.  HENT:  Head: Normocephalic and atraumatic.  Nose: Nose normal.  Eyes: EOM are normal. No scleral icterus.  Neck: Normal range of motion. Neck supple. Thyromegaly present.  Cardiovascular: Normal rate and regular rhythm.   Pulmonary/Chest: Effort normal and breath sounds normal.  Abdominal: Soft. There is no tenderness.  Musculoskeletal: Normal range of motion. She exhibits edema.  Neurological: She is alert and oriented to person, place, and time. Coordination normal.  Skin: Skin is warm and dry.  Psychiatric: She has a normal mood and affect. Her behavior is normal.  Vitals reviewed.   RECENT LABS AND TESTS: BMET    Component Value Date/Time   NA 140 07/04/2016 0841   K 4.0 07/04/2016 0841   CL 106 07/04/2016 0841   CO2 28 07/04/2016 0841   GLUCOSE 88 07/04/2016 0841   BUN 8 07/04/2016 0841   CREATININE 0.78 07/04/2016 0841   CREATININE 0.62 01/13/2014 1628   CALCIUM 9.0 07/04/2016 0841   Lab Results  Component Value Date   HGBA1C 5.5 02/17/2016   No results found for: INSULIN CBC    Component Value Date/Time   WBC 7.2 07/04/2016 0841   RBC 4.48 07/04/2016 0841   HGB 13.6 07/04/2016 0841    HGB 15.6 10/26/2010 1052   HCT 40.7 07/04/2016 0841   HCT 46.6 10/26/2010 1052   PLT 262.0 07/04/2016 0841   PLT 220 10/26/2010 1052   MCV 90.8 07/04/2016 0841   MCV 94.9 01/13/2014 1638   MCV 93.3 10/26/2010 1052   MCH 30.0 01/13/2014 1638   MCH 29.9 11/15/2012 0545   MCHC 33.3 07/04/2016 0841   RDW 13.6 07/04/2016 0841   RDW 14.2 10/26/2010 1052   LYMPHSABS 1.5 07/04/2016 0841   LYMPHSABS 1.0 10/26/2010 1052   MONOABS 0.6 07/04/2016 0841   MONOABS 0.5 10/26/2010 1052   EOSABS 0.1 07/04/2016 0841   EOSABS 0.0 10/26/2010  1052   BASOSABS 0.0 07/04/2016 0841   BASOSABS 0.0 10/26/2010 1052   Iron/TIBC/Ferritin/ %Sat No results found for: IRON, TIBC, FERRITIN, IRONPCTSAT Lipid Panel     Component Value Date/Time   CHOL 179 02/17/2016 1017   TRIG 63.0 02/17/2016 1017   HDL 53.70 02/17/2016 1017   CHOLHDL 3 02/17/2016 1017   VLDL 12.6 02/17/2016 1017   LDLCALC 113 (H) 02/17/2016 1017   Hepatic Function Panel     Component Value Date/Time   PROT 7.0 07/04/2016 0841   ALBUMIN 3.9 07/04/2016 0841   AST 15 07/04/2016 0841   ALT 10 07/04/2016 0841   ALKPHOS 64 07/04/2016 0841   BILITOT 0.9 07/04/2016 0841   BILIDIR 0.1 03/31/2014 1015      Component Value Date/Time   TSH 2.54 07/04/2016 0841   TSH 1.02 02/17/2016 1017   TSH 1.29 03/31/2014 1015    ECG done today shows NSR @ 72 BPM INDIRECT CALORIMETER done today shows a VO2 of 250 and a REE of 1741.    ASSESSMENT AND PLAN: Other fatigue - Plan: EKG 12-Lead, Comprehensive metabolic panel, CBC With Differential, Lipid Panel With LDL/HDL Ratio, VITAMIN D 25 Hydroxy (Vit-D Deficiency, Fractures), Folate, T3, T4, free, TSH  Dyspnea on exertion  Vitamin D deficiency  Binge eating  Depression screening  At risk for heart disease  Class 2 obesity with body mass index (BMI) of 38.0 to 38.9 in adult, unspecified obesity type, unspecified whether serious comorbidity present - Plan: Hemoglobin A1c, Insulin,  random  PLAN:  Fatigue Sarahjane was informed that her fatigue may be related to obesity, depression or many other causes. Labs will be ordered, and in the meanwhile Donneshia has agreed to work on diet, exercise and weight loss to help with fatigue. Proper sleep hygiene was discussed including the need for 7-8 hours of quality sleep each night. A sleep study was not ordered based on symptoms and Epworth score.  Exercise Induced Shortness of Breath Kiernan's shortness of breath appears to be obesity related and exercise induced. She has agreed to work on weight loss and gradually increase exercise to treat her exercise induced shortness of breath. If Daisha follows our instructions and loses weight without improvement of her shortness of breath, we will plan to refer to pulmonology. We will monitor this condition regularly. Emiliana agrees to this plan.  Vitamin D Deficiency Hasley was informed that low vitamin D levels contributes to fatigue and are associated with obesity will check labs and follow.  Binge Eating Pt describes episodes of eating larger portions of food than normal. She states this waxes and wanes and does not current meet the criteria for binge eating disorder, but does have binge eating episodes. Will work on this in clinic using CBT, may need to refer for counselling if no improvement is seen,.  Depression Screen Gwendolin had a very high positive depression screening. Depression is commonly associated with obesity and often results in emotional eating behaviors. We will monitor this closely and work on CBT to help improve the non-hunger eating patterns. Referral to Psychology may be required if no improvement is seen as she continues in our clinic.  Cardiovascular risk counselling Shemeeka was given extended (at least 15 minutes) coronary artery disease prevention counseling today. She is 45 y.o. female and has risk factors for heart disease including obesity and strong family history   .  We discussed intensive lifestyle modifications today with an emphasis on specific weight loss instructions and strategies. Pt was also  informed of the importance of increasing exercise and decreasing saturated fats to help prevent heart disease.  Obesity Charlann is currently in the action stage of change and her goal is to continue with weight loss efforts She has agreed to follow the Category 2 plan Myrakle has been instructed to work up to a goal of 150 minutes of combined cardio and strengthening exercise per week for weight loss and overall health benefits. We discussed the following Behavioral Modification Stratagies today: increasing lean protein intake, work on meal planning and easy cooking plans, dealing with family or coworker sabotage and holiday eating strategies   Rodella has agreed to follow up with our clinic in 2 weeks. She was informed of the importance of frequent follow up visits to maximize her success with intensive lifestyle modifications for her multiple health conditions. She was informed we would discuss her lab results at her next visit unless there is a critical issue that needs to be addressed sooner. Ramla agreed to keep her next visit at the agreed upon time to discuss these results.  I, Doreene Nest, am acting as scribe for Dennard Nip, MD  I have reviewed the above documentation for accuracy and completeness, and I agree with the above. -Dennard Nip, MD

## 2016-11-28 NOTE — Progress Notes (Deleted)
n

## 2016-11-29 LAB — LIPID PANEL WITH LDL/HDL RATIO
CHOLESTEROL TOTAL: 189 mg/dL (ref 100–199)
HDL: 67 mg/dL (ref >39)
LDL Calculated: 108 mg/dL — ABNORMAL HIGH (ref 0–99)
LDL/HDL RATIO: 1.6 ratio (ref 0.0–3.2)
TRIGLYCERIDES: 72 mg/dL (ref 0–149)
VLDL Cholesterol Cal: 14 mg/dL (ref 5–40)

## 2016-11-29 LAB — COMPREHENSIVE METABOLIC PANEL
A/G RATIO: 1.3 (ref 1.2–2.2)
ALBUMIN: 4 g/dL (ref 3.5–5.5)
ALT: 9 IU/L (ref 0–32)
AST: 14 IU/L (ref 0–40)
Alkaline Phosphatase: 73 IU/L (ref 39–117)
BILIRUBIN TOTAL: 1 mg/dL (ref 0.0–1.2)
BUN / CREAT RATIO: 15 (ref 9–23)
BUN: 11 mg/dL (ref 6–24)
CHLORIDE: 102 mmol/L (ref 96–106)
CO2: 22 mmol/L (ref 18–29)
Calcium: 9 mg/dL (ref 8.7–10.2)
Creatinine, Ser: 0.73 mg/dL (ref 0.57–1.00)
GFR calc non Af Amer: 100 mL/min/{1.73_m2} (ref >59)
GFR, EST AFRICAN AMERICAN: 116 mL/min/{1.73_m2} (ref >59)
GLOBULIN, TOTAL: 3.1 g/dL (ref 1.5–4.5)
Glucose: 83 mg/dL (ref 65–99)
POTASSIUM: 4.6 mmol/L (ref 3.5–5.2)
SODIUM: 142 mmol/L (ref 134–144)
TOTAL PROTEIN: 7.1 g/dL (ref 6.0–8.5)

## 2016-11-29 LAB — CBC WITH DIFFERENTIAL
BASOS ABS: 0 10*3/uL (ref 0.0–0.2)
Basos: 0 %
EOS (ABSOLUTE): 0.1 10*3/uL (ref 0.0–0.4)
Eos: 2 %
Hematocrit: 40.5 % (ref 34.0–46.6)
Hemoglobin: 13.6 g/dL (ref 11.1–15.9)
IMMATURE GRANULOCYTES: 1 %
Immature Grans (Abs): 0.1 10*3/uL (ref 0.0–0.1)
LYMPHS ABS: 1.9 10*3/uL (ref 0.7–3.1)
Lymphs: 23 %
MCH: 30.2 pg (ref 26.6–33.0)
MCHC: 33.6 g/dL (ref 31.5–35.7)
MCV: 90 fL (ref 79–97)
MONOS ABS: 0.5 10*3/uL (ref 0.1–0.9)
Monocytes: 7 %
NEUTROS ABS: 5.4 10*3/uL (ref 1.4–7.0)
Neutrophils: 67 %
RBC: 4.5 x10E6/uL (ref 3.77–5.28)
RDW: 13.6 % (ref 12.3–15.4)
WBC: 7.9 10*3/uL (ref 3.4–10.8)

## 2016-11-29 LAB — HEMOGLOBIN A1C
ESTIMATED AVERAGE GLUCOSE: 100 mg/dL
HEMOGLOBIN A1C: 5.1 % (ref 4.8–5.6)

## 2016-11-29 LAB — TSH: TSH: 1.88 u[IU]/mL (ref 0.450–4.500)

## 2016-11-29 LAB — FOLATE: Folate: 13 ng/mL (ref >3.0)

## 2016-11-29 LAB — INSULIN, RANDOM: INSULIN: 12.6 u[IU]/mL (ref 2.6–24.9)

## 2016-11-29 LAB — T3: T3 TOTAL: 92 ng/dL (ref 71–180)

## 2016-11-29 LAB — VITAMIN D 25 HYDROXY (VIT D DEFICIENCY, FRACTURES): Vit D, 25-Hydroxy: 14.3 ng/mL — ABNORMAL LOW (ref 30.0–100.0)

## 2016-11-29 LAB — T4, FREE: FREE T4: 1.1 ng/dL (ref 0.82–1.77)

## 2016-12-08 MED FILL — DEXTROAMP-AMPHETAMIN 20 MG: 20 | 90 days supply | Qty: 360 | Fill #0

## 2016-12-13 ENCOUNTER — Ambulatory Visit (INDEPENDENT_AMBULATORY_CARE_PROVIDER_SITE_OTHER): Payer: 59 | Admitting: Family Medicine

## 2016-12-13 VITALS — BP 122/74 | Ht 66.0 in | Wt 239.0 lb

## 2016-12-13 DIAGNOSIS — Z9189 Other specified personal risk factors, not elsewhere classified: Secondary | ICD-10-CM

## 2016-12-13 DIAGNOSIS — E8881 Metabolic syndrome: Secondary | ICD-10-CM

## 2016-12-13 DIAGNOSIS — E559 Vitamin D deficiency, unspecified: Secondary | ICD-10-CM

## 2016-12-13 DIAGNOSIS — Z6838 Body mass index (BMI) 38.0-38.9, adult: Secondary | ICD-10-CM

## 2016-12-13 DIAGNOSIS — E669 Obesity, unspecified: Secondary | ICD-10-CM

## 2016-12-13 MED ORDER — VITAMIN D (ERGOCALCIFEROL) 1.25 MG (50000 UNIT) PO CAPS: 50000.0000 [IU] | ORAL_CAPSULE | ORAL | 0 refills | Status: DC

## 2016-12-13 NOTE — Progress Notes (Signed)
Office: [phone]  /  Fax: [phone]   HPI:   Chief Complaint: OBESITY Molly Sanchez is here to discuss her progress with her obesity treatment plan. She is following her eating plan approximately 40 % of the time and states she is exercising 0 minutes 0 times per week. Molly Sanchez is currently struggling with increased temptations, polyphagia, states she had celebration eating over Christmas, just started her plan 4-5 days ago.Marland Kitchen  Her weight is 239 lb (108.4 kg) today and gained 2 pounds since her last visit. She has lost 0 lbs since starting treatment with [place].  Vitamin D deficiency Molly Sanchez has a diagnosis of vitamin D deficiency. She is not currently taking vit D and denies nausea, vomiting or muscle weakness.  Insulin Resistance Insulin resistance new diagnosis. Patient has normal fasting glucose and A1c but fasting insulin is >5. Positive polyphagia, worse in evening, consistent with insulin resistance. Positive family history of diabetes.  Wt Readings from Last 500 Encounters:  12/13/16 239 lb (108.4 kg)  11/28/16 237 lb 12.8 oz (107.9 kg)  07/04/16 242 lb (109.8 kg)  02/17/16 235 lb 9.6 oz (106.9 kg)  01/17/16 236 lb (107 kg)  06/29/14 217 lb (98.4 kg)  03/31/14 217 lb (98.4 kg)  11/14/12 265 lb (120.2 kg)     ALLERGIES: No Known Allergies  MEDICATIONS: Current Outpatient Prescriptions on File Prior to Visit  Medication Sig Dispense Refill   amphetamine-dextroamphetamine (ADDERALL) 20 MG tablet   0   No current facility-administered medications on file prior to visit.     PAST MEDICAL HISTORY: Past Medical History:  Diagnosis Date   ADD (attention deficit disorder)    ANA positive    Antiphospholipid antibody syndrome (HCC) 11/15/2012   Antiphospholipid antibody syndrome complicating pregnancy ([place])    Back pain    Breast fibroadenoma    history of   Clotting disorder ([place])    with pregancy   Depression    Dysplasia of cervix, low grade (CIN 1) [id number]     Fatigue    Fibromyalgia    GERD (gastroesophageal reflux disease)    GERD (gastroesophageal reflux disease)    H/O varicella    Hx of blood clots    During pregnancy   Joint pain    Raynaud's disease    Rosacea    Vitamin D deficiency     PAST SURGICAL HISTORY: Past Surgical History:  Procedure Laterality Date   CESAREAN SECTION     GYNECOLOGIC CRYOSURGERY     of cervix   right breast fibroadenoma removed      SOCIAL HISTORY: Social History  Substance Use Topics   Smoking status: Never Smoker   Smokeless tobacco: Never Used   Alcohol use 0.6 oz/week    1 Glasses of wine per week     Comment: rare    FAMILY HISTORY: Family History  Problem Relation Age of Onset   Hypertension Mother    Stroke Mother    Mental illness Mother     schizophrenia   Heart attack Mother    Diabetes Mother    Hyperlipidemia Mother    Heart disease Mother    Depression Mother    Anxiety disorder Mother    Schizophrenia Mother    Obesity Mother    Mental illness Brother     schizophrenia   Colon cancer Neg Hx    Esophageal cancer Neg Hx    Rectal cancer Neg Hx    Stomach cancer Neg Hx    Pancreatic cancer  Neg Hx     ROS: Review of Systems  Constitutional: Positive for malaise/fatigue.  Endo/Heme/Allergies:       Polyphagia    PHYSICAL EXAM: Blood pressure 122/74, height 5\' 6"  (1.676 m), weight 239 lb (108.4 kg). Body mass index is 38.58 kg/m. Physical Exam  Constitutional: She is oriented to person, place, and time. She appears well-developed and well-nourished.  HENT:  Head: Normocephalic and atraumatic.  Nose: Nose normal.  Eyes: EOM are normal. No scleral icterus.  Neck: Normal range of motion. Neck supple. No thyromegaly present.  Cardiovascular: Normal rate and regular rhythm.   Pulmonary/Chest: No respiratory distress.  Abdominal: Soft. There is no tenderness.  Obesity  Musculoskeletal: Normal range of motion. She exhibits  edema.  Neurological: She is alert and oriented to person, place, and time. Coordination normal.  Skin: Skin is warm and dry.  Psychiatric: She has a normal mood and affect. Her behavior is normal.    RECENT LABS AND TESTS: BMET    Component Value Date/Time   NA 142 11/28/2016 1040   K 4.6 11/28/2016 1040   CL 102 11/28/2016 1040   CO2 22 11/28/2016 1040   GLUCOSE 83 11/28/2016 1040   GLUCOSE 88 07/04/2016 0841   BUN 11 11/28/2016 1040   CREATININE 0.73 11/28/2016 1040   CREATININE 0.62 01/13/2014 1628   CALCIUM 9.0 11/28/2016 1040   GFRNONAA 100 11/28/2016 1040   GFRAA 116 11/28/2016 1040   Lab Results  Component Value Date   HGBA1C 5.1 11/28/2016   HGBA1C 5.5 02/17/2016   Lab Results  Component Value Date   INSULIN 12.6 11/28/2016   CBC    Component Value Date/Time   WBC 7.9 11/28/2016 1040   WBC 7.2 07/04/2016 0841   RBC 4.50 11/28/2016 1040   RBC 4.48 07/04/2016 0841   HGB 13.6 07/04/2016 0841   HGB 15.6 10/26/2010 1052   HCT 40.5 11/28/2016 1040   HCT 46.6 10/26/2010 1052   PLT 262.0 07/04/2016 0841   PLT 220 10/26/2010 1052   MCV 90 11/28/2016 1040   MCV 93.3 10/26/2010 1052   MCH 30.2 11/28/2016 1040   MCH 30.0 01/13/2014 1638   MCH 29.9 11/15/2012 0545   MCHC 33.6 11/28/2016 1040   MCHC 33.3 07/04/2016 0841   RDW 13.6 11/28/2016 1040   RDW 14.2 10/26/2010 1052   LYMPHSABS 1.9 11/28/2016 1040   LYMPHSABS 1.0 10/26/2010 1052   MONOABS 0.6 07/04/2016 0841   MONOABS 0.5 10/26/2010 1052   EOSABS 0.1 11/28/2016 1040   BASOSABS 0.0 11/28/2016 1040   BASOSABS 0.0 10/26/2010 1052   Iron/TIBC/Ferritin/ %Sat No results found for: IRON, TIBC, FERRITIN, IRONPCTSAT Lipid Panel     Component Value Date/Time   CHOL 189 11/28/2016 1040   TRIG 72 11/28/2016 1040   HDL 67 11/28/2016 1040   CHOLHDL 3 02/17/2016 1017   VLDL 12.6 02/17/2016 1017   LDLCALC 108 (H) 11/28/2016 1040   Hepatic Function Panel     Component Value Date/Time   PROT 7.1 11/28/2016  1040   ALBUMIN 4.0 11/28/2016 1040   AST 14 11/28/2016 1040   ALT 9 11/28/2016 1040   ALKPHOS 73 11/28/2016 1040   BILITOT 1.0 11/28/2016 1040   BILIDIR 0.1 03/31/2014 1015      Component Value Date/Time   TSH 1.880 11/28/2016 1040   TSH 2.54 07/04/2016 0841   TSH 1.02 02/17/2016 1017    ASSESSMENT AND PLAN: Insulin resistance  Vitamin D deficiency - Plan: Vitamin D, Ergocalciferol, (DRISDOL)  50000 units CAPS capsule  At risk for diabetes mellitus  Class 2 obesity without serious comorbidity with body mass index (BMI) of 38.0 to 38.9 in adult, unspecified obesity type  PLAN:  Vitamin D Deficiency Molly Sanchez was informed that low vitamin D levels contributes to fatigue and are associated with obesity, breast, and colon cancer. She agrees to start to take prescription Vit D @50 ,000 IU every week, #4 no refills and will follow up for routine testing of vitamin D, at least 2-3 times per year. She was informed of the risk of over-replacement of vitamin D and agrees to not increase her dose unless she discusses this with [place] first.  Insulin Resistance Molly Sanchez will continue to work on weight loss, exercise, and decreasing simple carbohydrates in her diet to help decrease the risk of diabetes. We dicussed metformin including benefits and risks. She was informed that eating too many simple carbohydrates or too many calories at one sitting increases the likelihood of GI side effects. Molly Sanchez requested metformin for now and prescription was written today. Molly Sanchez agreed to follow up with [place] as directed to monitor her progress.  Diabetes risk counselling Molly Sanchez was given extended (at least 30 minutes) diabetes prevention counseling today. She is 46 y.o. female and has risk factors for diabetes including obesity. We discussed intensive lifestyle modifications today with an emphasis on weight loss as well as increasing exercise and decreasing simple carbohydrates in her diet.  Obesity Molly Sanchez is  currently in the action stage of change. As such, her goal is to continue with weight loss efforts She has agreed to follow the Category 2 plan Molly Sanchez has been instructed to work up to a goal of 150 minutes of combined cardio and strengthening exercise per week for weight loss and overall health benefits. We discussed the following Behavioral Modification Stratagies today: increasing lean protein intake, decreasing simple carbohydrates , decrease eating out and dealing with family or coworker sabotage  Molly Sanchez has agreed to follow up with our clinic in 2 weeks. She was informed of the importance of frequent follow up visits to maximize her success with intensive lifestyle modifications for her multiple health conditions.  I, Doreene Nest, am acting as scribe for Dennard Nip, MD  I have reviewed the above documentation for accuracy and completeness, and I agree with the above. -Dennard Nip, MD

## 2016-12-25 ENCOUNTER — Encounter (INDEPENDENT_AMBULATORY_CARE_PROVIDER_SITE_OTHER): Payer: Self-pay

## 2016-12-25 ENCOUNTER — Ambulatory Visit (INDEPENDENT_AMBULATORY_CARE_PROVIDER_SITE_OTHER): Payer: 59 | Admitting: Family Medicine

## 2016-12-27 ENCOUNTER — Ambulatory Visit (INDEPENDENT_AMBULATORY_CARE_PROVIDER_SITE_OTHER): Payer: 59 | Admitting: Family Medicine

## 2017-01-01 ENCOUNTER — Ambulatory Visit (INDEPENDENT_AMBULATORY_CARE_PROVIDER_SITE_OTHER): Payer: 59 | Admitting: Family Medicine

## 2017-01-01 VITALS — BP 113/73 | HR 78 | Temp 98.6°F | Ht 66.0 in | Wt 240.0 lb

## 2017-01-01 DIAGNOSIS — E669 Obesity, unspecified: Secondary | ICD-10-CM | POA: Diagnosis not present

## 2017-01-01 DIAGNOSIS — Z6838 Body mass index (BMI) 38.0-38.9, adult: Secondary | ICD-10-CM | POA: Diagnosis not present

## 2017-01-01 DIAGNOSIS — F32A Depression, unspecified: Secondary | ICD-10-CM

## 2017-01-01 DIAGNOSIS — E88819 Insulin resistance, unspecified: Secondary | ICD-10-CM

## 2017-01-01 DIAGNOSIS — E66812 Obesity, class 2: Secondary | ICD-10-CM

## 2017-01-01 DIAGNOSIS — Z9189 Other specified personal risk factors, not elsewhere classified: Secondary | ICD-10-CM | POA: Diagnosis not present

## 2017-01-01 DIAGNOSIS — E8881 Metabolic syndrome: Secondary | ICD-10-CM

## 2017-01-01 DIAGNOSIS — F329 Major depressive disorder, single episode, unspecified: Secondary | ICD-10-CM

## 2017-01-01 MED ORDER — BUPROPION HCL ER (SR) 150 MG PO TB12: 150.0000 mg | ORAL_TABLET | Freq: Every day | ORAL | 0 refills | Status: DC

## 2017-01-02 MED FILL — VIT D2 1.25 MG (50,000 UNIT: 1.25 MG | 28 days supply | Qty: 4 | Fill #0

## 2017-01-02 MED FILL — BUPROPION SR 150 MG TABLET: 150 | 30 days supply | Qty: 30 | Fill #0

## 2017-01-03 NOTE — Progress Notes (Signed)
Office: [phone]  /  Fax: [phone]   HPI:   Chief Complaint: OBESITY Rachana is here to discuss her progress with her obesity treatment plan. She is following her eating plan approximately 50 % of the time and states she is exercising 0 minutes 0 times per week. 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 She reports increased emotional eating and is now ready to get back on track.Marland Kitchen  Her weight is 240 lb (108.9 kg) today and has had a weight gain of 1 lb over a period of 2 weeks since her last visit. She has gained 3 lbs since starting treatment with [place].  Depression Taylia has a new diagnosis of depression, she reports increased emotional eating with increased stress at work, she is frustrated that she is struggling so much with this, she also reports that she is not sleeping well and her mood has been low, she denies suicidal ideas or homicidal ideas.   Insulin Resistance Zeba has a diagnosis of insulin resistance based on her elevated fasting insulin level >5. Although Chezney's blood glucose readings are still under good control, insulin resistance puts her at greater risk of metabolic syndrome and diabetes. Tomiya had many questions regarding insulin resistance and how she should eat and her diabetes risk for diabetes. She is not taking metformin currently and continues to work on diet and exercise to decrease risk of diabetes.  At risk for diabetes Tenessa is at higher than average risk for developing diabetes due to her obesity and insulin resistance. She currently denies polyuria or polydipsia.   Wt Readings from Last 500 Encounters:  01/01/17 240 lb (108.9 kg)  12/13/16 239 lb (108.4 kg)  11/28/16 237 lb 12.8 oz (107.9 kg)  07/04/16 242 lb (109.8 kg)  02/17/16 235 lb 9.6 oz (106.9 kg)  01/17/16 236 lb (107 kg)  06/29/14 217 lb (98.4 kg)  03/31/14 217 lb (98.4 kg)  11/14/12  265 lb (120.2 kg)     ALLERGIES: No Known Allergies  MEDICATIONS: Current Outpatient Prescriptions on File Prior to Visit  Medication Sig Dispense Refill  . amphetamine-dextroamphetamine (ADDERALL) 20 MG tablet   0  . Vitamin D, Ergocalciferol, (DRISDOL) 50000 units CAPS capsule Take 1 capsule (50,000 Units total) by mouth every 7 (seven) days. 4 capsule 0   No current facility-administered medications on file prior to visit.     PAST MEDICAL HISTORY: Past Medical History:  Diagnosis Date  . ADD (attention deficit disorder)   . ANA positive   . Antiphospholipid antibody syndrome ([place]) 11/15/2012  . Antiphospholipid antibody syndrome complicating pregnancy ([place])   . Back pain   . Breast fibroadenoma    history of  . Clotting disorder ([place])    with pregancy  . Depression   . Dysplasia of cervix, low grade (CIN 1) [id number]  . Fatigue   . Fibromyalgia   . GERD (gastroesophageal reflux disease)   . GERD (gastroesophageal reflux disease)   . H/O varicella   . Hx of blood clots    During pregnancy  . Joint pain   . Raynaud's disease   . Rosacea   . Vitamin D deficiency     PAST SURGICAL HISTORY: Past Surgical History:  Procedure Laterality Date  . CESAREAN SECTION    . GYNECOLOGIC CRYOSURGERY     of cervix  . right breast fibroadenoma removed      SOCIAL HISTORY: Social  History  Substance Use Topics  . Smoking status: Never Smoker  . Smokeless tobacco: Never Used  . Alcohol use 0.6 oz/week    1 Glasses of wine per week     Comment: rare    FAMILY HISTORY: Family History  Problem Relation Age of Onset  . Hypertension Mother   . Stroke Mother   . Mental illness Mother     schizophrenia  . Heart attack Mother   . Diabetes Mother   . Hyperlipidemia Mother   . Heart disease Mother   . Depression Mother   . Anxiety disorder Mother   . Schizophrenia Mother   . Obesity Mother   . Mental illness Brother     schizophrenia  . Colon cancer Neg Hx   .  Esophageal cancer Neg Hx   . Rectal cancer Neg Hx   . Stomach cancer Neg Hx   . Pancreatic cancer Neg Hx     ROS: Review of Systems  Psychiatric/Behavioral: Positive for depression. Negative for suicidal ideas. The patient has insomnia.        Positive Stress    PHYSICAL EXAM: Blood pressure 113/73, pulse 78, temperature 98.6 F (37 C), temperature source Oral, height 5\' 6"  (1.676 m), weight 240 lb (108.9 kg), SpO2 100 %. Body mass index is 38.74 kg/m. Physical Exam  Constitutional: She is oriented to person, place, and time. She appears well-developed and well-nourished.  Cardiovascular: Normal rate.   Pulmonary/Chest: Effort normal.  Musculoskeletal: Normal range of motion.  Neurological: She is oriented to person, place, and time.  Skin: Skin is warm and dry.    RECENT LABS AND TESTS: BMET    Component Value Date/Time   NA 142 11/28/2016 1040   K 4.6 11/28/2016 1040   CL 102 11/28/2016 1040   CO2 22 11/28/2016 1040   GLUCOSE 83 11/28/2016 1040   GLUCOSE 88 07/04/2016 0841   BUN 11 11/28/2016 1040   CREATININE 0.73 11/28/2016 1040   CREATININE 0.62 01/13/2014 1628   CALCIUM 9.0 11/28/2016 1040   GFRNONAA 100 11/28/2016 1040   GFRAA 116 11/28/2016 1040   Lab Results  Component Value Date   HGBA1C 5.1 11/28/2016   HGBA1C 5.5 02/17/2016   Lab Results  Component Value Date   INSULIN 12.6 11/28/2016   CBC    Component Value Date/Time   WBC 7.9 11/28/2016 1040   WBC 7.2 07/04/2016 0841   RBC 4.50 11/28/2016 1040   RBC 4.48 07/04/2016 0841   HGB 13.6 07/04/2016 0841   HGB 15.6 10/26/2010 1052   HCT 40.5 11/28/2016 1040   HCT 46.6 10/26/2010 1052   PLT 262.0 07/04/2016 0841   PLT 220 10/26/2010 1052   MCV 90 11/28/2016 1040   MCV 93.3 10/26/2010 1052   MCH 30.2 11/28/2016 1040   MCH 30.0 01/13/2014 1638   MCH 29.9 11/15/2012 0545   MCHC 33.6 11/28/2016 1040   MCHC 33.3 07/04/2016 0841   RDW 13.6 11/28/2016 1040   RDW 14.2 10/26/2010 1052   LYMPHSABS  1.9 11/28/2016 1040   LYMPHSABS 1.0 10/26/2010 1052   MONOABS 0.6 07/04/2016 0841   MONOABS 0.5 10/26/2010 1052   EOSABS 0.1 11/28/2016 1040   BASOSABS 0.0 11/28/2016 1040   BASOSABS 0.0 10/26/2010 1052   Iron/TIBC/Ferritin/ %Sat No results found for: IRON, TIBC, FERRITIN, IRONPCTSAT Lipid Panel     Component Value Date/Time   CHOL 189 11/28/2016 1040   TRIG 72 11/28/2016 1040   HDL 67 11/28/2016 1040   CHOLHDL  3 02/17/2016 1017   VLDL 12.6 02/17/2016 1017   LDLCALC 108 (H) 11/28/2016 1040   Hepatic Function Panel     Component Value Date/Time   PROT 7.1 11/28/2016 1040   ALBUMIN 4.0 11/28/2016 1040   AST 14 11/28/2016 1040   ALT 9 11/28/2016 1040   ALKPHOS 73 11/28/2016 1040   BILITOT 1.0 11/28/2016 1040   BILIDIR 0.1 03/31/2014 1015      Component Value Date/Time   TSH 1.880 11/28/2016 1040   TSH 2.54 07/04/2016 0841   TSH 1.02 02/17/2016 1017    ASSESSMENT AND PLAN: Depression, unspecified depression type - Plan: buPROPion (WELLBUTRIN SR) 150 MG 12 hr tablet  Insulin resistance  Class 2 obesity without serious comorbidity with body mass index (BMI) of 38.0 to 38.9 in adult, unspecified obesity type  PLAN:  Depression with Emotional Eating Behaviors We discussed behavior modification techniques today to help Corean deal with her emotional eating and depression. She has agreed to start to take Wellbutrin SR 150 mg every morning #30 with no refills and agreed to follow up as directed.  Insulin Resistance Dorianna will continue to work on weight loss, exercise, and decreasing simple carbohydrates in her diet to help decrease the risk of diabetes. We dicussed metformin including benefits and risks. She was informed that eating too many simple carbohydrates or too many calories at one sitting increases the likelihood of GI side effects. Merridee agreed to follow up with [place] as directed to monitor her progress.  Diabetes risk counselling Gregory was given extended (at  least 15 minutes) diabetes prevention counseling today. She is 46 y.o. female and has risk factors for diabetes including obesity. We discussed intensive lifestyle modifications today with an emphasis on weight loss as well as increasing exercise and decreasing simple carbohydrates in her diet.  Obesity Zahmya is currently in the action stage of change. As such, her goal is to get back to weightloss efforts  She has agreed to follow the Category 2 plan Maudena has been instructed to work up to a goal of 150 minutes of combined cardio and strengthening exercise per week for weight loss and overall health benefits. We discussed the following Behavioral Modification Stratagies today: increasing lean protein intake, decreasing simple carbohydrates , increasing vegetables, increasing lower sugar fruits and decreasing sodium intake  Syanna has agreed to follow up with our clinic in 2 weeks. She was informed of the importance of frequent follow up visits to maximize her success with intensive lifestyle modifications for her multiple health conditions.  I, Doreene Nest, am acting as scribe for Dennard Nip, MD  I have reviewed the above documentation for accuracy and completeness, and I agree with the above. -Dennard Nip, MD

## 2017-01-14 ENCOUNTER — Encounter (INDEPENDENT_AMBULATORY_CARE_PROVIDER_SITE_OTHER): Payer: Self-pay | Admitting: Family Medicine

## 2017-01-15 ENCOUNTER — Encounter (INDEPENDENT_AMBULATORY_CARE_PROVIDER_SITE_OTHER): Payer: Self-pay

## 2017-01-15 ENCOUNTER — Ambulatory Visit (INDEPENDENT_AMBULATORY_CARE_PROVIDER_SITE_OTHER): Payer: 59 | Admitting: Family Medicine

## 2017-01-15 NOTE — Telephone Encounter (Signed)
FYI

## 2017-01-22 ENCOUNTER — Ambulatory Visit (INDEPENDENT_AMBULATORY_CARE_PROVIDER_SITE_OTHER): Payer: 59 | Admitting: Family Medicine

## 2017-01-22 VITALS — BP 121/84 | HR 87 | Temp 98.4°F | Ht 66.0 in | Wt 240.0 lb

## 2017-01-22 DIAGNOSIS — F3289 Other specified depressive episodes: Secondary | ICD-10-CM | POA: Diagnosis not present

## 2017-01-22 DIAGNOSIS — E669 Obesity, unspecified: Secondary | ICD-10-CM

## 2017-01-22 DIAGNOSIS — E559 Vitamin D deficiency, unspecified: Secondary | ICD-10-CM

## 2017-01-22 DIAGNOSIS — E8881 Metabolic syndrome: Secondary | ICD-10-CM | POA: Diagnosis not present

## 2017-01-22 DIAGNOSIS — F32A Depression, unspecified: Secondary | ICD-10-CM

## 2017-01-22 DIAGNOSIS — F329 Major depressive disorder, single episode, unspecified: Secondary | ICD-10-CM

## 2017-01-22 DIAGNOSIS — Z6838 Body mass index (BMI) 38.0-38.9, adult: Secondary | ICD-10-CM | POA: Diagnosis not present

## 2017-01-22 MED ORDER — VITAMIN D (ERGOCALCIFEROL) 1.25 MG (50000 UNIT) PO CAPS: 50000.0000 [IU] | ORAL_CAPSULE | ORAL | 0 refills | Status: DC

## 2017-01-22 MED ORDER — BUPROPION HCL ER (SR) 150 MG PO TB12: 150.0000 mg | ORAL_TABLET | Freq: Every day | ORAL | 0 refills | Status: DC

## 2017-01-22 MED ORDER — METFORMIN HCL 500 MG PO TABS: 500.0000 mg | ORAL_TABLET | Freq: Every day | ORAL | 0 refills | Status: DC

## 2017-01-23 NOTE — Progress Notes (Signed)
Office: [phone]  /  Fax: [phone]   HPI:   Chief Complaint: OBESITY Molly Sanchez is here to discuss her progress with her obesity treatment plan. She is following her eating plan approximately 70 % of the time and states she is exercising 0 minutes 0 times per week. Molly Sanchez did well maintaining weight. She noticed increased cravings while premenstrual and she indulged but is now ready to get back on track. Her weight is 240 lb (108.9 kg) today and has not lost weight since her last visit. She has gained 3 lbs since starting treatment with [place].  Insulin Resistance Molly Sanchez has a diagnosis of insulin resistance based on her elevated fasting insulin level >5. Although Molly Sanchez's blood glucose readings are still under good control, insulin resistance puts her at greater risk of metabolic syndrome and diabetes. She is not taking metformin currently and continues to work on diet and exercise to decrease risk of diabetes. Kimya notes polyphagia and is struggling to follow prescribed diet and would like to try Metformin.  Vitamin D deficiency Molly Sanchez has a diagnosis of vitamin D deficiency. She is currently taking vit D and denies nausea, vomiting or muscle weakness.  Depression with emotional eating behaviors Molly Sanchez is struggling with emotional eating and using food for comfort to the extent that it is negatively impacting her health. She often snacks when she is not hungry. Molly Sanchez sometimes feels she is out of control and then feels guilty that she made poor food choices. She started Wellbutrin and notes improved fatigue and mood. She has started to see a therapist weekly as well.   Depression screen Molly Sanchez 2/9 11/28/2016 02/17/2016  Decreased Interest 3 0  Down, Depressed, Hopeless 1 0  PHQ - 2 Score 4 0  Altered sleeping 1 -  Tired, decreased energy 3 -  Change in appetite 3 -  Feeling bad or failure about yourself  3 -  Trouble concentrating 3 -  Moving slowly or fidgety/restless 2 -  Suicidal  thoughts 0 -  PHQ-9 Score 19 -     Wt Readings from Last 500 Encounters:  01/22/17 240 lb (108.9 kg)  01/01/17 240 lb (108.9 kg)  12/13/16 239 lb (108.4 kg)  11/28/16 237 lb 12.8 oz (107.9 kg)  07/04/16 242 lb (109.8 kg)  02/17/16 235 lb 9.6 oz (106.9 kg)  01/17/16 236 lb (107 kg)  06/29/14 217 lb (98.4 kg)  03/31/14 217 lb (98.4 kg)  11/14/12 265 lb (120.2 kg)     ALLERGIES: No Known Allergies  MEDICATIONS: Current Outpatient Prescriptions on File Prior to Visit  Medication Sig Dispense Refill  . amphetamine-dextroamphetamine (ADDERALL) 20 MG tablet   0   No current facility-administered medications on file prior to visit.     PAST MEDICAL HISTORY: Past Medical History:  Diagnosis Date  . ADD (attention deficit disorder)   . ANA positive   . Antiphospholipid antibody syndrome ([employer]) 11/15/2012  . Antiphospholipid antibody syndrome complicating pregnancy ([place])   . Back pain   . Breast fibroadenoma    history of  . Clotting disorder ([place])    with pregancy  . Depression   . Dysplasia of cervix, low grade (CIN 1) [id number]  . Fatigue   . Fibromyalgia   . GERD (gastroesophageal reflux disease)   . GERD (gastroesophageal reflux disease)   . H/O varicella   . Hx of blood clots    During pregnancy  . Joint pain   . Raynaud's disease   . Rosacea   .  Vitamin D deficiency     PAST SURGICAL HISTORY: Past Surgical History:  Procedure Laterality Date  . CESAREAN SECTION    . GYNECOLOGIC CRYOSURGERY     of cervix  . right breast fibroadenoma removed      SOCIAL HISTORY: Social History  Substance Use Topics  . Smoking status: Never Smoker  . Smokeless tobacco: Never Used  . Alcohol use 0.6 oz/week    1 Glasses of wine per week     Comment: rare    FAMILY HISTORY: Family History  Problem Relation Age of Onset  . Hypertension Mother   . Stroke Mother   . Mental illness Mother     schizophrenia  . Heart attack Mother   . Diabetes Mother   .  Hyperlipidemia Mother   . Heart disease Mother   . Depression Mother   . Anxiety disorder Mother   . Schizophrenia Mother   . Obesity Mother   . Mental illness Brother     schizophrenia  . Colon cancer Neg Hx   . Esophageal cancer Neg Hx   . Rectal cancer Neg Hx   . Stomach cancer Neg Hx   . Pancreatic cancer Neg Hx     ROS: Review of Systems  Constitutional: Positive for malaise/fatigue. Negative for weight loss.  Gastrointestinal: Negative for nausea and vomiting.  Musculoskeletal:       Negative Muscle Weakness  Endo/Heme/Allergies:       Polyphagia  Psychiatric/Behavioral: Positive for depression. Negative for suicidal ideas.    PHYSICAL EXAM: Blood pressure 121/84, pulse 87, temperature 98.4 F (36.9 C), temperature source Oral, height 5\' 6"  (1.676 m), weight 240 lb (108.9 kg), last menstrual period 01/15/2017, SpO2 100 %. Body mass index is 38.74 kg/m. Physical Exam  Constitutional: She is oriented to person, place, and time. She appears well-developed and well-nourished.  Cardiovascular: Normal rate.   Pulmonary/Chest: Effort normal.  Musculoskeletal: Normal range of motion.  Neurological: She is oriented to person, place, and time.  Skin: Skin is warm and dry.  Psychiatric: She has a normal mood and affect. Her behavior is normal.  Vitals reviewed.   RECENT LABS AND TESTS: BMET    Component Value Date/Time   NA 142 11/28/2016 1040   K 4.6 11/28/2016 1040   CL 102 11/28/2016 1040   CO2 22 11/28/2016 1040   GLUCOSE 83 11/28/2016 1040   GLUCOSE 88 07/04/2016 0841   BUN 11 11/28/2016 1040   CREATININE 0.73 11/28/2016 1040   CREATININE 0.62 01/13/2014 1628   CALCIUM 9.0 11/28/2016 1040   GFRNONAA 100 11/28/2016 1040   GFRAA 116 11/28/2016 1040   Lab Results  Component Value Date   HGBA1C 5.1 11/28/2016   HGBA1C 5.5 02/17/2016   Lab Results  Component Value Date   INSULIN 12.6 11/28/2016   CBC    Component Value Date/Time   WBC 7.9 11/28/2016  1040   WBC 7.2 07/04/2016 0841   RBC 4.50 11/28/2016 1040   RBC 4.48 07/04/2016 0841   HGB 13.6 07/04/2016 0841   HGB 15.6 10/26/2010 1052   HCT 40.5 11/28/2016 1040   HCT 46.6 10/26/2010 1052   PLT 262.0 07/04/2016 0841   PLT 220 10/26/2010 1052   MCV 90 11/28/2016 1040   MCV 93.3 10/26/2010 1052   MCH 30.2 11/28/2016 1040   MCH 30.0 01/13/2014 1638   MCH 29.9 11/15/2012 0545   MCHC 33.6 11/28/2016 1040   MCHC 33.3 07/04/2016 0841   RDW 13.6 11/28/2016 1040  RDW 14.2 10/26/2010 1052   LYMPHSABS 1.9 11/28/2016 1040   LYMPHSABS 1.0 10/26/2010 1052   MONOABS 0.6 07/04/2016 0841   MONOABS 0.5 10/26/2010 1052   EOSABS 0.1 11/28/2016 1040   BASOSABS 0.0 11/28/2016 1040   BASOSABS 0.0 10/26/2010 1052   Iron/TIBC/Ferritin/ %Sat No results found for: IRON, TIBC, FERRITIN, IRONPCTSAT Lipid Panel     Component Value Date/Time   CHOL 189 11/28/2016 1040   TRIG 72 11/28/2016 1040   HDL 67 11/28/2016 1040   CHOLHDL 3 02/17/2016 1017   VLDL 12.6 02/17/2016 1017   LDLCALC 108 (H) 11/28/2016 1040   Hepatic Function Panel     Component Value Date/Time   PROT 7.1 11/28/2016 1040   ALBUMIN 4.0 11/28/2016 1040   AST 14 11/28/2016 1040   ALT 9 11/28/2016 1040   ALKPHOS 73 11/28/2016 1040   BILITOT 1.0 11/28/2016 1040   BILIDIR 0.1 03/31/2014 1015      Component Value Date/Time   TSH 1.880 11/28/2016 1040   TSH 2.54 07/04/2016 0841   TSH 1.02 02/17/2016 1017    ASSESSMENT AND PLAN: Other depression  Insulin resistance - Plan: metFORMIN (GLUCOPHAGE) 500 MG tablet  Vitamin D deficiency - Plan: Vitamin D, Ergocalciferol, (DRISDOL) 50000 units CAPS capsule  Class 2 obesity without serious comorbidity with body mass index (BMI) of 38.0 to 38.9 in adult, unspecified obesity type  Depression, unspecified depression type - Plan: buPROPion (WELLBUTRIN SR) 150 MG 12 hr tablet  PLAN:  Insulin Resistance Molly Sanchez will continue to work on weight loss, exercise, and decreasing  simple carbohydrates in her diet to help decrease the risk of diabetes. We dicussed metformin including benefits and risks. She was informed that eating too many simple carbohydrates or too many calories at one sitting increases the likelihood of GI side effects. Yarisbeth requested metformin for now and prescription was written today 500 mg every morning #30 with no refills. Lamis agreed to follow up with [place] as directed to monitor her progress.  Vitamin D Deficiency Molly Sanchez was informed that low vitamin D levels contributes to fatigue and are associated with obesity, breast, and colon cancer. She agrees to continue to take prescription Vit D @50 ,000 IU every week with 1 refill and will follow up for routine testing of vitamin D, at least 2-3 times per year. She was informed of the risk of over-replacement of vitamin D and agrees to not increase her dose unless he discusses this with [place] first.   Depression with Emotional Eating Behaviors We discussed behavior modification techniques today to help Molly Sanchez deal with her emotional eating and depression. She has agreed to continue to take Wellbutrin SR 150 mg qd with 1 refill and agreed to follow up as directed.  Obesity Molly Sanchez is currently in the action stage of change. As such, her goal is to continue with weight loss efforts She has agreed to follow the Category 2 plan Molly Sanchez has been instructed to work up to a goal of 150 minutes of combined cardio and strengthening exercise per week for weight loss and overall health benefits. We discussed the following Behavioral Modification Stratagies today: increasing lean protein intake and emotional eating strategies  Molly Sanchez has agreed to follow up with our clinic in 2 weeks. She was informed of the importance of frequent follow up visits to maximize her success with intensive lifestyle modifications for her multiple health conditions.  I, Molly Sanchez, am acting as scribe for Molly Nip, MD  I have  reviewed the above documentation  for accuracy and completeness, and I agree with the above. -Molly Nip, MD

## 2017-01-24 ENCOUNTER — Other Ambulatory Visit (INDEPENDENT_AMBULATORY_CARE_PROVIDER_SITE_OTHER): Payer: Self-pay | Admitting: Family Medicine

## 2017-01-24 DIAGNOSIS — E8881 Metabolic syndrome: Secondary | ICD-10-CM

## 2017-01-24 DIAGNOSIS — E88819 Insulin resistance, unspecified: Secondary | ICD-10-CM

## 2017-01-31 ENCOUNTER — Telehealth: Payer: 59 | Admitting: Family

## 2017-01-31 DIAGNOSIS — J208 Acute bronchitis due to other specified organisms: Secondary | ICD-10-CM | POA: Diagnosis not present

## 2017-01-31 MED ORDER — BENZONATATE 100 MG PO CAPS: 100.0000 mg | ORAL_CAPSULE | Freq: Three times a day (TID) | ORAL | 0 refills | Status: DC | PRN

## 2017-01-31 NOTE — Progress Notes (Signed)

## 2017-02-02 MED FILL — FLUCONAZOLE 150 MG TABLET: 150 | 7 days supply | Qty: 3 | Fill #0

## 2017-02-02 MED FILL — BUPROPION SR 150 MG TABLET: 150 | 30 days supply | Qty: 30 | Fill #0

## 2017-02-05 ENCOUNTER — Telehealth (INDEPENDENT_AMBULATORY_CARE_PROVIDER_SITE_OTHER): Payer: Self-pay | Admitting: Family Medicine

## 2017-02-05 ENCOUNTER — Ambulatory Visit (INDEPENDENT_AMBULATORY_CARE_PROVIDER_SITE_OTHER): Payer: 59 | Admitting: Family Medicine

## 2017-02-05 ENCOUNTER — Encounter (INDEPENDENT_AMBULATORY_CARE_PROVIDER_SITE_OTHER): Payer: Self-pay

## 2017-02-05 NOTE — Telephone Encounter (Signed)
Pt called in day of her appt to cancel her appt. She states her child was just diagnosed with the flu. She will need to pay the no show fee as it was waived once before. SS

## 2017-02-09 ENCOUNTER — Telehealth: Payer: 59 | Admitting: Family

## 2017-02-09 DIAGNOSIS — B9689 Other specified bacterial agents as the cause of diseases classified elsewhere: Secondary | ICD-10-CM | POA: Diagnosis not present

## 2017-02-09 DIAGNOSIS — J028 Acute pharyngitis due to other specified organisms: Secondary | ICD-10-CM | POA: Diagnosis not present

## 2017-02-09 MED ORDER — PREDNISONE 5 MG PO TABS: 5.0000 mg | ORAL_TABLET | ORAL | 0 refills | Status: DC

## 2017-02-09 MED ORDER — AZITHROMYCIN 250 MG PO TABS: ORAL_TABLET | ORAL | 0 refills | Status: DC

## 2017-02-09 MED ORDER — BENZONATATE 100 MG PO CAPS: 100.0000 mg | ORAL_CAPSULE | Freq: Three times a day (TID) | ORAL | 0 refills | Status: DC | PRN

## 2017-02-09 NOTE — Progress Notes (Signed)
We are sorry that you are not feeling well.  Here is how we plan to help!  Based on what you have shared with me it looks like you have upper respiratory tract inflammation that has resulted in a significant cough.  Inflammation and infection in the upper respiratory tract is commonly called bronchitis and has four common causes:  Allergies, Viral Infections, Acid Reflux and Bacterial Infections.  Allergies, viruses and acid reflux are treated by controlling symptoms or eliminating the cause. An example might be a cough caused by taking certain blood pressure medications. You stop the cough by changing the medication. Another example might be a cough caused by acid reflux. Controlling the reflux helps control the cough.  Based on your presentation I believe you most likely have A cough due to bacteria.  When patients have a fever and a productive cough with a change in color or increased sputum production, we are concerned about bacterial bronchitis.  If left untreated it can progress to pneumonia.  If your symptoms do not improve with your treatment plan it is important that you contact your provider.   I have prescribed Azithromyin 250 mg: two tables now and then one tablet daily for 4 additonal days    In addition you may use A non-prescription cough medication called Mucinex DM: take 2 tablets every 12 hours. and A prescription cough medication called Tessalon Perles 100mg . You may take 1-2 capsules every 8 hours as needed for your cough.  Sterapred 5 mg dosepak  As far as the sore throat goes, the Chloraseptic and over-the-counter lozenges are the strongest thing they make for that. I would say the prednisone pack I'm giving you will help a lot to decrease swelling which will also reduce the pain. You can also add Tylenol to that to help with pain. Stick to softer foods and avoid anything rough that may scratch your throat until it improves.   USE OF BRONCHODILATOR ("RESCUE") INHALERS: There is a  risk from using your bronchodilator too frequently.  The risk is that over-reliance on a medication which only relaxes the muscles surrounding the breathing tubes can reduce the effectiveness of medications prescribed to reduce swelling and congestion of the tubes themselves.  Although you feel brief relief from the bronchodilator inhaler, your asthma may actually be worsening with the tubes becoming more swollen and filled with mucus.  This can delay other crucial treatments, such as oral steroid medications. If you need to use a bronchodilator inhaler daily, several times per day, you should discuss this with your provider.  There are probably better treatments that could be used to keep your asthma under control.     HOME CARE . Only take medications as instructed by your medical team. . Complete the entire course of an antibiotic. . Drink plenty of fluids and get plenty of rest. . Avoid close contacts especially the very young and the elderly . Cover your mouth if you cough or cough into your sleeve. . Always remember to wash your hands . A steam or ultrasonic humidifier can help congestion.   GET HELP RIGHT AWAY IF: . You develop worsening fever. . You become short of breath . You cough up blood. . Your symptoms persist after you have completed your treatment plan MAKE SURE YOU   Understand these instructions.  Will watch your condition.  Will get help right away if you are not doing well or get worse.  Your e-visit answers were reviewed by a board certified  advanced clinical practitioner to complete your personal care plan.  Depending on the condition, your plan could have included both over the counter or prescription medications. If there is a problem please reply  once you have received a response from your provider. Your safety is important to [place].  If you have drug allergies check your prescription carefully.    You can use MyChart to ask questions about today's visit, request a  non-urgent call back, or ask for a work or school excuse for 24 hours related to this e-Visit. If it has been greater than 24 hours you will need to follow up with your provider, or enter a new e-Visit to address those concerns. You will get an e-mail in the next two days asking about your experience.  I hope that your e-visit has been valuable and will speed your recovery. Thank you for using e-visits.

## 2017-02-13 ENCOUNTER — Ambulatory Visit (INDEPENDENT_AMBULATORY_CARE_PROVIDER_SITE_OTHER): Payer: 59 | Admitting: Family

## 2017-02-13 ENCOUNTER — Ambulatory Visit (INDEPENDENT_AMBULATORY_CARE_PROVIDER_SITE_OTHER): Payer: 59 | Admitting: Family Medicine

## 2017-02-13 ENCOUNTER — Encounter: Payer: Self-pay | Admitting: Family

## 2017-02-13 VITALS — BP 100/72 | HR 78 | Temp 99.0°F | Wt 236.8 lb

## 2017-02-13 VITALS — BP 113/75 | HR 98 | Temp 97.6°F | Ht 66.0 in | Wt 233.0 lb

## 2017-02-13 DIAGNOSIS — G44009 Cluster headache syndrome, unspecified, not intractable: Secondary | ICD-10-CM | POA: Diagnosis not present

## 2017-02-13 DIAGNOSIS — F3289 Other specified depressive episodes: Secondary | ICD-10-CM

## 2017-02-13 DIAGNOSIS — Z9189 Other specified personal risk factors, not elsewhere classified: Secondary | ICD-10-CM | POA: Diagnosis not present

## 2017-02-13 DIAGNOSIS — E8881 Metabolic syndrome: Secondary | ICD-10-CM

## 2017-02-13 DIAGNOSIS — R509 Fever, unspecified: Secondary | ICD-10-CM

## 2017-02-13 DIAGNOSIS — R05 Cough: Secondary | ICD-10-CM

## 2017-02-13 DIAGNOSIS — E669 Obesity, unspecified: Secondary | ICD-10-CM | POA: Diagnosis not present

## 2017-02-13 DIAGNOSIS — Z6837 Body mass index (BMI) 37.0-37.9, adult: Secondary | ICD-10-CM | POA: Insufficient documentation

## 2017-02-13 DIAGNOSIS — R6889 Other general symptoms and signs: Secondary | ICD-10-CM | POA: Diagnosis not present

## 2017-02-13 DIAGNOSIS — E88819 Insulin resistance, unspecified: Secondary | ICD-10-CM

## 2017-02-13 DIAGNOSIS — E66812 Obesity, class 2: Secondary | ICD-10-CM

## 2017-02-13 LAB — POCT INFLUENZA A/B
INFLUENZA A, POC: NEGATIVE
INFLUENZA B, POC: NEGATIVE

## 2017-02-13 MED ORDER — BUPROPION HCL ER (SR) 150 MG PO TB12: 150.0000 mg | ORAL_TABLET | Freq: Every day | ORAL | 0 refills | Status: DC

## 2017-02-13 MED ORDER — METFORMIN HCL 500 MG PO TABS: 500.0000 mg | ORAL_TABLET | Freq: Every day | ORAL | 0 refills | Status: DC

## 2017-02-13 MED ORDER — HYDROCODONE-HOMATROPINE 5-1.5 MG/5ML PO SYRP: 5.0000 mL | ORAL_SOLUTION | Freq: Every evening | ORAL | 0 refills | Status: DC | PRN

## 2017-02-13 NOTE — Progress Notes (Signed)
Pre visit review using our clinic review tool, if applicable. No additional management support is needed unless otherwise documented below in the visit note. 

## 2017-02-13 NOTE — Patient Instructions (Signed)
Would expect you would continue to feel better on azithromycin.   Let me know if you do not continue to do so  Please take cough medication at night only as needed. As we discussed, I do not recommend dosing throughout the day as coughing is a protective mechanism . It also helps to break up thick mucous.  Do not take cough suppressants with alcohol as can lead to trouble breathing. Advise caution if taking cough suppressant and operating machinery ( i.e driving a car) as you may feel very tired.   Increase intake of clear fluids. Congestion is best treated by hydration, when mucus is wetter, it is thinner, less sticky, and easier to expel from the body, either through coughing up drainage, or by blowing your nose.   Get plenty of rest.   Use saline nasal drops and blow your nose frequently. Run a humidifier at night and elevate the head of the bed. Vicks Vapor rub will help with congestion and cough. Steam showers and sinus massage for congestion.   Use Acetaminophen or Ibuprofen as needed for fever or pain. Avoid second hand smoke. Even the smallest exposure will worsen symptoms.   Over the counter medications you can try include Delsym for cough, a decongestant for congestion, and Mucinex or Robitussin as an expectorant. Be sure to just get the plain Mucinex or Robitussin that just has one medication (Guaifenesen). We don't recommend the combination products. Note, be sure to drink two glasses of water with each dose of Mucinex as the medication will not work well without adequate hydration.   You can also try a teaspoon of honey to see if this will help reduce cough. Throat lozenges can sometimes be beneficial as well.    This illness will typically last 7 - 10 days.   Please follow up with our clinic if you develop a fever greater than 101 F, symptoms worsen, or do not resolve in the next week.

## 2017-02-13 NOTE — Progress Notes (Signed)
Subjective:    Patient ID: Redgie Grayer, female    DOB: 10-26-1971, 46 y.o.   MRN: [id number]  CC: CHEALSEY STRAIN is a 46 y.o. female who presents today for an acute visit.    HPI: Cough, body aches, fever, chills started 2 days ago, slightly better today. Also endorses  Fatigue, mild HA. Low-grade fever, 100.4  Treated by e visit 3/2 for cough, sob . Started on prednisone and z pak, with improvement with SOB.  Last dose of azithromycin today.   No h/o asthma.  Both children had flu 2 weeks ago and other last week.      HISTORY:  Past Medical History:  Diagnosis Date  . ADD (attention deficit disorder)   . ANA positive   . Antiphospholipid antibody syndrome ([place]) 11/15/2012  . Antiphospholipid antibody syndrome complicating pregnancy ([place])   . Back pain   . Breast fibroadenoma    history of  . Clotting disorder ([place])    with pregancy  . Depression   . Dysplasia of cervix, low grade (CIN 1) [id number]  . Fatigue   . Fibromyalgia   . GERD (gastroesophageal reflux disease)   . GERD (gastroesophageal reflux disease)   . H/O varicella   . Hx of blood clots    During pregnancy  . Joint pain   . Raynaud's disease   . Rosacea   . Vitamin D deficiency    Past Surgical History:  Procedure Laterality Date  . CESAREAN SECTION    . GYNECOLOGIC CRYOSURGERY     of cervix  . right breast fibroadenoma removed     Family History  Problem Relation Age of Onset  . Hypertension Mother   . Stroke Mother   . Mental illness Mother     schizophrenia  . Heart attack Mother   . Diabetes Mother   . Hyperlipidemia Mother   . Heart disease Mother   . Depression Mother   . Anxiety disorder Mother   . Schizophrenia Mother   . Obesity Mother   . Mental illness Brother     schizophrenia  . Colon cancer Neg Hx   . Esophageal cancer Neg Hx   . Rectal cancer Neg Hx   . Stomach cancer Neg Hx   . Pancreatic cancer Neg Hx     Allergies: Patient has no known allergies. Current  Outpatient Prescriptions on File Prior to Visit  Medication Sig Dispense Refill  . amphetamine-dextroamphetamine (ADDERALL) 20 MG tablet   0  . azithromycin (ZITHROMAX) 250 MG tablet Take 2 tabs now then 1 daily times 4 days 6 tablet 0  . benzonatate (TESSALON PERLES) 100 MG capsule Take 1 capsule (100 mg total) by mouth 3 (three) times daily as needed. 20 capsule 0  . benzonatate (TESSALON PERLES) 100 MG capsule Take 1-2 capsules (100-200 mg total) by mouth every 8 (eight) hours as needed for cough. 30 capsule 0  . buPROPion (WELLBUTRIN SR) 150 MG 12 hr tablet Take 1 tablet (150 mg total) by mouth daily. 30 tablet 0  . metFORMIN (GLUCOPHAGE) 500 MG tablet Take 1 tablet (500 mg total) by mouth daily with breakfast. 30 tablet 0  . predniSONE (DELTASONE) 5 MG tablet Take 1 tablet (5 mg total) by mouth as directed. 21 dose pack taper per pharmacy 21 tablet 0  . Vitamin D, Ergocalciferol, (DRISDOL) 50000 units CAPS capsule Take 1 capsule (50,000 Units total) by mouth every 7 (seven) days. 4 capsule 0   No current facility-administered medications  on file prior to visit.     Social History  Substance Use Topics  . Smoking status: Never Smoker  . Smokeless tobacco: Never Used  . Alcohol use 0.6 oz/week    1 Glasses of wine per week     Comment: rare    Review of Systems  Constitutional: Positive for chills and fever.  HENT: Positive for congestion and sore throat. Negative for sinus pressure.   Respiratory: Positive for cough. Negative for wheezing.   Cardiovascular: Negative for chest pain and palpitations.  Gastrointestinal: Negative for nausea and vomiting.  Musculoskeletal: Positive for myalgias.  Neurological: Positive for headaches.      Objective:    BP 100/72 (BP Location: Left Arm, Patient Position: Sitting, Cuff Size: Large)   Pulse 78   Temp 99 F (37.2 C) (Oral)   Wt 236 lb 12.8 oz (107.4 kg)   LMP 01/15/2017   SpO2 98%   BMI 38.22 kg/m    Physical Exam    Constitutional: She appears well-developed and well-nourished.  HENT:  Head: Normocephalic and atraumatic.  Right Ear: Hearing, tympanic membrane, external ear and ear canal normal. No drainage, swelling or tenderness. No foreign bodies. Tympanic membrane is not erythematous and not bulging. No middle ear effusion. No decreased hearing is noted.  Left Ear: Hearing, tympanic membrane, external ear and ear canal normal. No drainage, swelling or tenderness. No foreign bodies. Tympanic membrane is not erythematous and not bulging.  No middle ear effusion. No decreased hearing is noted.  Nose: Rhinorrhea present. Right sinus exhibits no maxillary sinus tenderness and no frontal sinus tenderness. Left sinus exhibits no maxillary sinus tenderness and no frontal sinus tenderness.  Mouth/Throat: Uvula is midline, oropharynx is clear and moist and mucous membranes are normal. No oropharyngeal exudate, posterior oropharyngeal edema, posterior oropharyngeal erythema or tonsillar abscesses.  Eyes: Conjunctivae are normal.  Cardiovascular: Regular rhythm, normal heart sounds and normal pulses.   Pulmonary/Chest: Effort normal and breath sounds normal. She has no wheezes. She has no rhonchi. She has no rales.  Lymphadenopathy:       Head (right side): No submental, no submandibular, no tonsillar, no preauricular, no posterior auricular and no occipital adenopathy present.       Head (left side): No submental, no submandibular, no tonsillar, no preauricular, no posterior auricular and no occipital adenopathy present.    She has no cervical adenopathy.  Neurological: She is alert.  Skin: Skin is warm and dry.  Psychiatric: She has a normal mood and affect. Her speech is normal and behavior is normal. Thought content normal.  Vitals reviewed.      Assessment & Plan:   1. Flu-like symptoms Negative flu. She's feeling better on azithromycin and has one more dose of the medication. We jointly agreed to treat  this as a bacterial URI and to finish azithromycin. Reassured she is feeling better today. Afebrile. We'll start when necessary cough medication and she will let me know if she does not continue to improve.   - POCT Influenza A/B - HYDROcodone-homatropine (HYCODAN) 5-1.5 MG/5ML syrup; Take 5 mLs by mouth at bedtime as needed for cough.  Dispense: 30 mL; Refill: 0    I am having Ms. Lofaro maintain her amphetamine-dextroamphetamine, buPROPion, metFORMIN, Vitamin D (Ergocalciferol), benzonatate, benzonatate, azithromycin, and predniSONE.   No orders of the defined types were placed in this encounter.   Return precautions given.   Risks, benefits, and alternatives of the medications and treatment plan prescribed today were discussed, and patient  expressed understanding.   Education regarding symptom management and diagnosis given to patient on AVS.  Continue to follow with Mable Paris, FNP for routine health maintenance.   Redgie Grayer and I agreed with plan.   Mable Paris, FNP

## 2017-02-13 NOTE — Progress Notes (Signed)
Office: [phone]  /  Fax: [phone]   HPI:   Chief Complaint: OBESITY Molly Sanchez is here to discuss her progress with her obesity treatment plan. She is following her eating plan approximately 90 % of the time and states she is exercising 0 minutes 0 times per week. Molly Sanchez has done well with weight loss and notes feeling more in control of her eating. She is doing better with planning ahead. She is still struggling with workplace temptations. Her weight is 233 lb (105.7 kg) today and has had a weight loss of 7 pounds over a period of 3 weeks since her last visit. She has lost 4 lbs since starting treatment with [place].  Depression Molly Sanchez started Wellbutrin and noted improved mood and decreased emotional eating, she currently denies insomnia and has had decrease in binge episodes.  Insulin Resistance Molly Sanchez has a diagnosis of insulin resistance based on her elevated fasting insulin level >5. Although Molly Sanchez's blood glucose readings are still under good control, insulin resistance puts her at greater risk of metabolic syndrome and diabetes. She is taking metformin currently and is doing well and continues to work on diet and exercise to decrease risk of diabetes. She denies nausea, vomiting or muscle weakness.  At risk for diabetes Molly Sanchez is at higher than average risk for developing diabetes due to her obesity. She currently denies polyuria or polydipsia.  Wt Readings from Last 500 Encounters:  02/13/17 233 lb (105.7 kg)  02/13/17 236 lb 12.8 oz (107.4 kg)  01/22/17 240 lb (108.9 kg)  01/01/17 240 lb (108.9 kg)  12/13/16 239 lb (108.4 kg)  11/28/16 237 lb 12.8 oz (107.9 kg)  07/04/16 242 lb (109.8 kg)  02/17/16 235 lb 9.6 oz (106.9 kg)  01/17/16 236 lb (107 kg)  06/29/14 217 lb (98.4 kg)  03/31/14 217 lb (98.4 kg)  11/14/12 265 lb (120.2 kg)     ALLERGIES: No Known Allergies  MEDICATIONS: Current Outpatient Prescriptions on File Prior to Visit  Medication Sig Dispense Refill   . amphetamine-dextroamphetamine (ADDERALL) 20 MG tablet   0  . azithromycin (ZITHROMAX) 250 MG tablet Take 2 tabs now then 1 daily times 4 days 6 tablet 0  . benzonatate (TESSALON PERLES) 100 MG capsule Take 1 capsule (100 mg total) by mouth 3 (three) times daily as needed. 20 capsule 0  . benzonatate (TESSALON PERLES) 100 MG capsule Take 1-2 capsules (100-200 mg total) by mouth every 8 (eight) hours as needed for cough. 30 capsule 0  . predniSONE (DELTASONE) 5 MG tablet Take 1 tablet (5 mg total) by mouth as directed. 21 dose pack taper per pharmacy 21 tablet 0  . Vitamin D, Ergocalciferol, (DRISDOL) 50000 units CAPS capsule Take 1 capsule (50,000 Units total) by mouth every 7 (seven) days. 4 capsule 0   No current facility-administered medications on file prior to visit.     PAST MEDICAL HISTORY: Past Medical History:  Diagnosis Date  . ADD (attention deficit disorder)   . ANA positive   . Antiphospholipid antibody syndrome ([place]) 11/15/2012  . Antiphospholipid antibody syndrome complicating pregnancy ([place])   . Back pain   . Breast fibroadenoma    history of  . Clotting disorder ([place])    with pregancy  . Depression   . Dysplasia of cervix, low grade (CIN 1) [id number]  . Fatigue   . Fibromyalgia   . GERD (gastroesophageal reflux disease)   . GERD (gastroesophageal reflux disease)   . H/O varicella   . Hx of blood  clots    During pregnancy  . Joint pain   . Raynaud's disease   . Rosacea   . Vitamin D deficiency     PAST SURGICAL HISTORY: Past Surgical History:  Procedure Laterality Date  . CESAREAN SECTION    . GYNECOLOGIC CRYOSURGERY     of cervix  . right breast fibroadenoma removed      SOCIAL HISTORY: Social History  Substance Use Topics  . Smoking status: Never Smoker  . Smokeless tobacco: Never Used  . Alcohol use 0.6 oz/week    1 Glasses of wine per week     Comment: rare    FAMILY HISTORY: Family History  Problem Relation Age of Onset  . Hypertension  Mother   . Stroke Mother   . Mental illness Mother     schizophrenia  . Heart attack Mother   . Diabetes Mother   . Hyperlipidemia Mother   . Heart disease Mother   . Depression Mother   . Anxiety disorder Mother   . Schizophrenia Mother   . Obesity Mother   . Mental illness Brother     schizophrenia  . Colon cancer Neg Hx   . Esophageal cancer Neg Hx   . Rectal cancer Neg Hx   . Stomach cancer Neg Hx   . Pancreatic cancer Neg Hx     ROS: Review of Systems  Constitutional: Positive for weight loss.  Gastrointestinal: Negative for nausea and vomiting.  Genitourinary: Negative for frequency.  Musculoskeletal:       Negative Muscle Weakness  Endo/Heme/Allergies: Negative for polydipsia.  Psychiatric/Behavioral: Positive for depression. The patient does not have insomnia.     PHYSICAL EXAM: Blood pressure 113/75, pulse 98, temperature 97.6 F (36.4 C), temperature source Oral, height 5\' 6"  (1.676 m), weight 233 lb (105.7 kg), last menstrual period 01/13/2017, SpO2 99 %. Body mass index is 37.61 kg/m. Physical Exam  Constitutional: She is oriented to person, place, and time. She appears well-nourished.  Cardiovascular: Normal rate.   Pulmonary/Chest: Effort normal.  Musculoskeletal: Normal range of motion.  Neurological: She is oriented to person, place, and time.  Skin: Skin is warm and dry.  Vitals reviewed.   RECENT LABS AND TESTS: BMET    Component Value Date/Time   NA 142 11/28/2016 1040   K 4.6 11/28/2016 1040   CL 102 11/28/2016 1040   CO2 22 11/28/2016 1040   GLUCOSE 83 11/28/2016 1040   GLUCOSE 88 07/04/2016 0841   BUN 11 11/28/2016 1040   CREATININE 0.73 11/28/2016 1040   CREATININE 0.62 01/13/2014 1628   CALCIUM 9.0 11/28/2016 1040   GFRNONAA 100 11/28/2016 1040   GFRAA 116 11/28/2016 1040   Lab Results  Component Value Date   HGBA1C 5.1 11/28/2016   HGBA1C 5.5 02/17/2016   Lab Results  Component Value Date   INSULIN 12.6 11/28/2016    CBC    Component Value Date/Time   WBC 7.9 11/28/2016 1040   WBC 7.2 07/04/2016 0841   RBC 4.50 11/28/2016 1040   RBC 4.48 07/04/2016 0841   HGB 13.6 07/04/2016 0841   HGB 15.6 10/26/2010 1052   HCT 40.5 11/28/2016 1040   HCT 46.6 10/26/2010 1052   PLT 262.0 07/04/2016 0841   PLT 220 10/26/2010 1052   MCV 90 11/28/2016 1040   MCV 93.3 10/26/2010 1052   MCH 30.2 11/28/2016 1040   MCH 30.0 01/13/2014 1638   MCH 29.9 11/15/2012 0545   MCHC 33.6 11/28/2016 1040   MCHC 33.3 07/04/2016  0841   RDW 13.6 11/28/2016 1040   RDW 14.2 10/26/2010 1052   LYMPHSABS 1.9 11/28/2016 1040   LYMPHSABS 1.0 10/26/2010 1052   MONOABS 0.6 07/04/2016 0841   MONOABS 0.5 10/26/2010 1052   EOSABS 0.1 11/28/2016 1040   BASOSABS 0.0 11/28/2016 1040   BASOSABS 0.0 10/26/2010 1052   Iron/TIBC/Ferritin/ %Sat No results found for: IRON, TIBC, FERRITIN, IRONPCTSAT Lipid Panel     Component Value Date/Time   CHOL 189 11/28/2016 1040   TRIG 72 11/28/2016 1040   HDL 67 11/28/2016 1040   CHOLHDL 3 02/17/2016 1017   VLDL 12.6 02/17/2016 1017   LDLCALC 108 (H) 11/28/2016 1040   Hepatic Function Panel     Component Value Date/Time   PROT 7.1 11/28/2016 1040   ALBUMIN 4.0 11/28/2016 1040   AST 14 11/28/2016 1040   ALT 9 11/28/2016 1040   ALKPHOS 73 11/28/2016 1040   BILITOT 1.0 11/28/2016 1040   BILIDIR 0.1 03/31/2014 1015      Component Value Date/Time   TSH 1.880 11/28/2016 1040   TSH 2.54 07/04/2016 0841   TSH 1.02 02/17/2016 1017    ASSESSMENT AND PLAN: Insulin resistance - Plan: metFORMIN (GLUCOPHAGE) 500 MG tablet  Other depression - Plan: buPROPion (WELLBUTRIN SR) 150 MG 12 hr tablet  At risk for diabetes mellitus  Class 2 obesity without serious comorbidity with body mass index (BMI) of 37.0 to 37.9 in adult, unspecified obesity type  PLAN:  Depression Molly Sanchez agrees to continue Wellbutrin 150 mg daily, refill for 1 month and will follow up in our office in 2 weeks.  Insulin  Resistance Molly Sanchez will continue to work on weight loss, exercise, and decreasing simple carbohydrates in her diet to help decrease the risk of diabetes. We dicussed metformin including benefits and risks. She was informed that eating too many simple carbohydrates or too many calories at one sitting increases the likelihood of GI side effects. Molly Sanchez agrees to continue to take metformin for now and prescription was written today for 1 month refill.Molly Sanchez agreed to follow up with [place] as directed to monitor her progress.  Diabetes risk counselling Molly Sanchez was given extended (at least 15 minutes) diabetes prevention counseling today. She is 46 y.o. female and has risk factors for diabetes including obesity. We discussed intensive lifestyle modifications today with an emphasis on weight loss as well as increasing exercise and decreasing simple carbohydrates in her diet.  Obesity Molly Sanchez is currently in the action stage of change. As such, her goal is to continue with weight loss efforts She has agreed to follow the Category 2 plan Molly Sanchez has been instructed to work up to a goal of 150 minutes of combined cardio and strengthening exercise per week for weight loss and overall health benefits. We discussed the following Behavioral Modification Stratagies today: increasing lean protein intake, increasing lower sugar fruits, emotional eating strategies and decrease liquid calories  Molly Sanchez has agreed to follow up with our clinic in 2 weeks. She was informed of the importance of frequent follow up visits to maximize her success with intensive lifestyle modifications for her multiple health conditions.  I, Doreene Nest, am acting as scribe for Dennard Nip, MD  I have reviewed the above documentation for accuracy and completeness, and I agree with the above. -Dennard Nip, MD

## 2017-02-27 ENCOUNTER — Ambulatory Visit (INDEPENDENT_AMBULATORY_CARE_PROVIDER_SITE_OTHER): Payer: 59 | Admitting: Family Medicine

## 2017-02-28 ENCOUNTER — Encounter (INDEPENDENT_AMBULATORY_CARE_PROVIDER_SITE_OTHER): Payer: Self-pay

## 2017-02-28 ENCOUNTER — Ambulatory Visit (INDEPENDENT_AMBULATORY_CARE_PROVIDER_SITE_OTHER): Payer: 59 | Admitting: Family Medicine

## 2017-03-02 MED FILL — metFORMIN HCL 500 MG TABS: 500 | 30 days supply | Qty: 30 | Fill #0

## 2017-03-02 MED FILL — BUPROPION SR 150 MG TABLET: 150 | 30 days supply | Qty: 30 | Fill #0

## 2017-03-14 ENCOUNTER — Ambulatory Visit (INDEPENDENT_AMBULATORY_CARE_PROVIDER_SITE_OTHER): Payer: 59 | Admitting: Family Medicine

## 2017-03-14 ENCOUNTER — Encounter (INDEPENDENT_AMBULATORY_CARE_PROVIDER_SITE_OTHER): Payer: Self-pay

## 2017-03-23 MED FILL — DEXTROAMP-AMPHETAMIN 20 MG: 20 | 90 days supply | Qty: 180 | Fill #0

## 2017-04-09 ENCOUNTER — Encounter: Payer: Self-pay | Admitting: *Deleted

## 2017-05-04 ENCOUNTER — Encounter: Payer: Self-pay | Admitting: Internal Medicine

## 2017-05-15 MED FILL — DEXTROAMP-AMPHETAMIN 20 MG: 20 | 45 days supply | Qty: 180 | Fill #0

## 2017-07-03 ENCOUNTER — Encounter: Payer: Self-pay | Admitting: Internal Medicine

## 2017-07-03 MED FILL — DEXTROAMP-AMPHETAMIN 20 MG: 20 | 90 days supply | Qty: 360 | Fill #0

## 2017-08-08 ENCOUNTER — Ambulatory Visit (AMBULATORY_SURGERY_CENTER): Payer: Self-pay

## 2017-08-08 VITALS — Ht 67.0 in | Wt 224.6 lb

## 2017-08-08 DIAGNOSIS — Z860100 Personal history of colon polyps, unspecified: Secondary | ICD-10-CM

## 2017-08-08 MED ORDER — SUPREP BOWEL PREP KIT 17.5-3.13-1.6 GM/177ML PO SOLN: 1.0000 | Freq: Once | ORAL | 0 refills | Status: AC

## 2017-08-08 NOTE — Progress Notes (Signed)
No allergies to eggs or soy No diet meds No home oxygen No past problems with anesthesia  Registered emmi 

## 2017-08-09 ENCOUNTER — Encounter: Payer: Self-pay | Admitting: Internal Medicine

## 2017-08-22 ENCOUNTER — Encounter: Payer: Self-pay | Admitting: Internal Medicine

## 2017-08-22 ENCOUNTER — Ambulatory Visit (AMBULATORY_SURGERY_CENTER): Payer: 59 | Admitting: Internal Medicine

## 2017-08-22 VITALS — BP 106/76 | HR 59 | Temp 96.6°F | Resp 11 | Ht 66.0 in | Wt 233.0 lb

## 2017-08-22 DIAGNOSIS — Z86010 Personal history of colonic polyps: Secondary | ICD-10-CM | POA: Diagnosis present

## 2017-08-22 DIAGNOSIS — F900 Attention-deficit hyperactivity disorder, predominantly inattentive type: Secondary | ICD-10-CM | POA: Diagnosis not present

## 2017-08-22 DIAGNOSIS — M797 Fibromyalgia: Secondary | ICD-10-CM | POA: Diagnosis not present

## 2017-08-22 DIAGNOSIS — K6389 Other specified diseases of intestine: Secondary | ICD-10-CM | POA: Diagnosis not present

## 2017-08-22 DIAGNOSIS — D120 Benign neoplasm of cecum: Secondary | ICD-10-CM

## 2017-08-22 DIAGNOSIS — K635 Polyp of colon: Secondary | ICD-10-CM | POA: Diagnosis not present

## 2017-08-22 DIAGNOSIS — I7300 Raynaud's syndrome without gangrene: Secondary | ICD-10-CM | POA: Diagnosis not present

## 2017-08-22 DIAGNOSIS — E119 Type 2 diabetes mellitus without complications: Secondary | ICD-10-CM | POA: Diagnosis not present

## 2017-08-22 MED ORDER — SODIUM CHLORIDE 0.9 % IV SOLN: 500.0000 mL | INTRAVENOUS | Status: AC

## 2017-08-22 NOTE — Op Note (Signed)
[facility] Patient Name: Molly Sanchez Procedure Date: 08/22/2017 2:21 PM MRN: [id number] Endoscopist: Jerene Bears , MD Age: 46 Referring MD:  Date of Birth: [birth year] Gender: Female Account #: [id number] Procedure:                Colonoscopy Indications:              Surveillance: Personal history of adenomatous                            polyps on last colonoscopy 3 years ago Medicines:                Monitored Anesthesia Care Procedure:                Pre-Anesthesia Assessment:                           - Prior to the procedure, a History and Physical                            was performed, and patient medications and                            allergies were reviewed. The patient's tolerance of                            previous anesthesia was also reviewed. The risks                            and benefits of the procedure and the sedation                            options and risks were discussed with the patient.                            All questions were answered, and informed consent                            was obtained. Prior Anticoagulants: The patient has                            taken no previous anticoagulant or antiplatelet                            agents. ASA Grade Assessment: II - A patient with                            mild systemic disease. After reviewing the risks                            and benefits, the patient was deemed in                            satisfactory condition to undergo the procedure.  After obtaining informed consent, the colonoscope                            was passed under direct vision. Throughout the                            procedure, the patient's blood pressure, pulse, and                            oxygen saturations were monitored continuously. The                            Colonoscope was introduced through the anus and                            advanced to the the cecum,  identified by                            appendiceal orifice and ileocecal valve. The                            quality of the bowel preparation was good. The                            ileocecal valve, appendiceal orifice, and rectum                            were photographed. The colonoscopy was performed                            without difficulty. The patient tolerated the                            procedure well. Scope In: 2:39:03 PM Scope Out: 2:52:02 PM Scope Withdrawal Time: 0 hours 8 minutes 47 seconds  Total Procedure Duration: 0 hours 12 minutes 59 seconds  Findings:                 The digital rectal exam was normal.                           A 2 mm polyp was found in the ileocecal valve. The                            polyp was sessile. The polyp was removed with a                            cold biopsy forceps. Resection and retrieval were                            complete.                           A few medium-mouthed diverticula were found in the  hepatic flexure.                           Internal hemorrhoids were found during retroflexion.                           The exam was otherwise without abnormality. Complications:            No immediate complications. Estimated Blood Loss:     Estimated blood loss was minimal. Impression:               - One 2 mm polyp at the ileocecal valve, removed                            with a cold biopsy forceps. Resected and retrieved.                           - Diverticulosis at the hepatic flexure.                           - Internal hemorrhoids.                           - The examination was otherwise normal. Recommendation:           - Patient has a contact number available for                            emergencies. The signs and symptoms of potential                            delayed complications were discussed with the                            patient. Return to normal activities  tomorrow.                            Written discharge instructions were provided to the                            patient.                           - Resume previous diet.                           - Continue present medications.                           - Await pathology results.                           - Repeat colonoscopy in 5 years for surveillance. Jerene Bears, MD 08/22/2017 2:55:57 PM This report has been signed electronically.

## 2017-08-22 NOTE — Progress Notes (Signed)
Report to PACU, RN, vss, BBS= Clear.  

## 2017-08-22 NOTE — Patient Instructions (Signed)
**   Handouts given on polyps, diverticulosis, and hemorrhoids **   YOU HAD AN ENDOSCOPIC PROCEDURE TODAY AT THE [facility]:   Refer to the procedure report that was given to you for any specific questions about what was found during the examination.  If the procedure report does not answer your questions, please call your gastroenterologist to clarify.  If you requested that your care partner not be given the details of your procedure findings, then the procedure report has been included in a sealed envelope for you to review at your convenience later.  YOU SHOULD EXPECT: Some feelings of bloating in the abdomen. Passage of more gas than usual.  Walking can help get rid of the air that was put into your GI tract during the procedure and reduce the bloating. If you had a lower endoscopy (such as a colonoscopy or flexible sigmoidoscopy) you may notice spotting of blood in your stool or on the toilet paper. If you underwent a bowel prep for your procedure, you may not have a normal bowel movement for a few days.  Please Note:  You might notice some irritation and congestion in your nose or some drainage.  This is from the oxygen used during your procedure.  There is no need for concern and it should clear up in a day or so.  SYMPTOMS TO REPORT IMMEDIATELY:   Following lower endoscopy (colonoscopy or flexible sigmoidoscopy):  Excessive amounts of blood in the stool  Significant tenderness or worsening of abdominal pains  Swelling of the abdomen that is new, acute  Fever of 100F or higher  For urgent or emergent issues, a gastroenterologist can be reached at any hour by calling [phone].   DIET:  We do recommend a small meal at first, but then you may proceed to your regular diet.  Drink plenty of fluids but you should avoid alcoholic beverages for 24 hours.  ACTIVITY:  You should plan to take it easy for the rest of today and you should NOT DRIVE or use heavy machinery until  tomorrow (because of the sedation medicines used during the test).    FOLLOW UP: Our staff will call the number listed on your records the next business day following your procedure to check on you and address any questions or concerns that you may have regarding the information given to you following your procedure. If we do not reach you, we will leave a message.  However, if you are feeling well and you are not experiencing any problems, there is no need to return our call.  We will assume that you have returned to your regular daily activities without incident.  If any biopsies were taken you will be contacted by phone or by letter within the next 1-3 weeks.  Please call us at [phone] if you have not heard about the biopsies in 3 weeks.    SIGNATURES/CONFIDENTIALITY: You and/or your care partner have signed paperwork which will be entered into your electronic medical record.  These signatures attest to the fact that that the information above on your After Visit Summary has been reviewed and is understood.  Full responsibility of the confidentiality of this discharge information lies with you and/or your care-partner. 

## 2017-08-22 NOTE — Progress Notes (Signed)
Called to room to assist during endoscopic procedure.  Patient ID and intended procedure confirmed with present staff. Received instructions for my participation in the procedure from the performing physician.  

## 2017-08-23 ENCOUNTER — Telehealth: Payer: Self-pay | Admitting: *Deleted

## 2017-08-23 ENCOUNTER — Telehealth: Payer: Self-pay

## 2017-08-23 NOTE — Telephone Encounter (Signed)
  Follow up Call-  Call back number 08/22/2017  Post procedure Call Back phone  # [phone]  Permission to leave phone message Yes  Some recent data might be hidden     Patient questions:  Message left to call us if necessary.

## 2017-08-23 NOTE — Telephone Encounter (Signed)
Call never went to voicemail.

## 2017-08-30 ENCOUNTER — Encounter: Payer: Self-pay | Admitting: Internal Medicine

## 2017-10-02 MED FILL — DEXTROAMP-AMPHETAMIN 20 MG: 20 | 90 days supply | Qty: 360 | Fill #0

## 2017-10-02 MED FILL — DESVENLAFAXINE SUC ER 50 MG: 50 | 90 days supply | Qty: 90 | Fill #0 | Status: TO

## 2017-12-31 ENCOUNTER — Encounter: Payer: Self-pay | Admitting: Family

## 2018-01-17 MED FILL — AMPHETAMINE-DEXTRO 20MG: 20 | 90 days supply | Qty: 360 | Fill #0

## 2018-01-23 DIAGNOSIS — Z6835 Body mass index (BMI) 35.0-35.9, adult: Secondary | ICD-10-CM | POA: Diagnosis not present

## 2018-01-23 DIAGNOSIS — Z13220 Encounter for screening for lipoid disorders: Secondary | ICD-10-CM | POA: Diagnosis not present

## 2018-01-23 DIAGNOSIS — Z01419 Encounter for gynecological examination (general) (routine) without abnormal findings: Secondary | ICD-10-CM | POA: Diagnosis not present

## 2018-01-23 DIAGNOSIS — Z30433 Encounter for removal and reinsertion of intrauterine contraceptive device: Secondary | ICD-10-CM | POA: Diagnosis not present

## 2018-01-29 ENCOUNTER — Ambulatory Visit: Payer: Self-pay | Admitting: Family

## 2018-02-22 ENCOUNTER — Encounter: Payer: Self-pay | Admitting: Family

## 2018-02-22 ENCOUNTER — Ambulatory Visit: Payer: 59 | Admitting: Family

## 2018-02-22 VITALS — BP 108/74 | HR 74 | Temp 97.3°F | Resp 20 | Ht 66.0 in | Wt 225.4 lb

## 2018-02-22 DIAGNOSIS — Z0000 Encounter for general adult medical examination without abnormal findings: Secondary | ICD-10-CM

## 2018-02-22 LAB — CBC WITH DIFFERENTIAL/PLATELET
BASOS PCT: 1.1 % (ref 0.0–3.0)
Basophils Absolute: 0.1 10*3/uL (ref 0.0–0.1)
EOS PCT: 1.9 % (ref 0.0–5.0)
Eosinophils Absolute: 0.1 10*3/uL (ref 0.0–0.7)
HEMATOCRIT: 41.4 % (ref 36.0–46.0)
Hemoglobin: 14 g/dL (ref 12.0–15.0)
LYMPHS ABS: 1.2 10*3/uL (ref 0.7–4.0)
Lymphocytes Relative: 21.9 % (ref 12.0–46.0)
MCHC: 33.8 g/dL (ref 30.0–36.0)
MCV: 92 fl (ref 78.0–100.0)
MONOS PCT: 9.4 % (ref 3.0–12.0)
Monocytes Absolute: 0.5 10*3/uL (ref 0.1–1.0)
NEUTROS PCT: 65.7 % (ref 43.0–77.0)
Neutro Abs: 3.6 10*3/uL (ref 1.4–7.7)
PLATELETS: 254 10*3/uL (ref 150.0–400.0)
RBC: 4.5 Mil/uL (ref 3.87–5.11)
RDW: 14.2 % (ref 11.5–15.5)
WBC: 5.5 10*3/uL (ref 4.0–10.5)

## 2018-02-22 LAB — COMPREHENSIVE METABOLIC PANEL
ALT: 10 U/L (ref 0–35)
AST: 13 U/L (ref 0–37)
Albumin: 4.1 g/dL (ref 3.5–5.2)
Alkaline Phosphatase: 59 U/L (ref 39–117)
BUN: 13 mg/dL (ref 6–23)
CALCIUM: 9.3 mg/dL (ref 8.4–10.5)
CO2: 27 meq/L (ref 19–32)
Chloride: 105 mEq/L (ref 96–112)
Creatinine, Ser: 0.73 mg/dL (ref 0.40–1.20)
GFR: 110.34 mL/min (ref >60.00)
GLUCOSE: 92 mg/dL (ref 70–99)
POTASSIUM: 4.1 meq/L (ref 3.5–5.1)
Sodium: 139 mEq/L (ref 135–145)
Total Bilirubin: 0.9 mg/dL (ref 0.2–1.2)
Total Protein: 7.2 g/dL (ref 6.0–8.3)

## 2018-02-22 LAB — LIPID PANEL
CHOLESTEROL: 201 mg/dL — AB (ref 0–200)
HDL: 75.3 mg/dL (ref >39.00)
LDL CALC: 115 mg/dL — AB (ref 0–99)
NonHDL: 125.93
TRIGLYCERIDES: 54 mg/dL (ref 0.0–149.0)
Total CHOL/HDL Ratio: 3
VLDL: 10.8 mg/dL (ref 0.0–40.0)

## 2018-02-22 LAB — VITAMIN D 25 HYDROXY (VIT D DEFICIENCY, FRACTURES): VITD: 13.92 ng/mL — AB (ref 30.00–100.00)

## 2018-02-22 LAB — TSH: TSH: 2.11 u[IU]/mL (ref 0.35–4.50)

## 2018-02-22 LAB — HEMOGLOBIN A1C: Hgb A1c MFr Bld: 5.4 % (ref 4.6–6.5)

## 2018-02-22 NOTE — Assessment & Plan Note (Addendum)
Patient follows with OB/GYN.  She has no breast or pelvic complaints today.  She is due mammogram and will schedule. She politely declines pelvic and clinical breast exam today in the office in the absence of complaints.  She would like to see dermatologist and that referral has been placed.  She also would like to see a nutritionist for further advice with keto diet.  Congratulated patient on weight loss- Weight was 242 at last visit with me in 2017.  She will let me know if would like to go back on Wellbutrin,  metformin which had been started by Dr. Leafy Ro.

## 2018-02-22 NOTE — Patient Instructions (Addendum)
Referral to dermatology, nutrition.  Please schedule your mammogram as discussed Pleasure seeing you  Health Maintenance, Female Adopting a healthy lifestyle and getting preventive care can go a long way to promote health and wellness. Talk with your health care provider about what schedule of regular examinations is right for you. This is a good chance for you to check in with your provider about disease prevention and staying healthy. In between checkups, there are plenty of things you can do on your own. Experts have done a lot of research about which lifestyle changes and preventive measures are most likely to keep you healthy. Ask your health care provider for more information. Weight and diet Eat a healthy diet  Be sure to include plenty of vegetables, fruits, low-fat dairy products, and lean protein.  Do not eat a lot of foods high in solid fats, added sugars, or salt.  Get regular exercise. This is one of the most important things you can do for your health. ? Most adults should exercise for at least 150 minutes each week. The exercise should increase your heart rate and make you sweat (moderate-intensity exercise). ? Most adults should also do strengthening exercises at least twice a week. This is in addition to the moderate-intensity exercise.  Maintain a healthy weight  Body mass index (BMI) is a measurement that can be used to identify possible weight problems. It estimates body fat based on height and weight. Your health care provider can help determine your BMI and help you achieve or maintain a healthy weight.  For females 46 years of age and older: ? A BMI below 18.5 is considered underweight. ? A BMI of 18.5 to 24.9 is normal. ? A BMI of 25 to 29.9 is considered overweight. ? A BMI of 30 and above is considered obese.  Watch levels of cholesterol and blood lipids  You should start having your blood tested for lipids and cholesterol at 46 years of age, then have this test  every 5 years.  You may need to have your cholesterol levels checked more often if: ? Your lipid or cholesterol levels are high. ? You are older than 46 years of age. ? You are at high risk for heart disease.  Cancer screening Lung Cancer  Lung cancer screening is recommended for adults 28-21 years old who are at high risk for lung cancer because of a history of smoking.  A yearly low-dose CT scan of the lungs is recommended for people who: ? Currently smoke. ? Have quit within the past 15 years. ? Have at least a 30-pack-year history of smoking. A pack year is smoking an average of one pack of cigarettes a day for 1 year.  Yearly screening should continue until it has been 15 years since you quit.  Yearly screening should stop if you develop a health problem that would prevent you from having lung cancer treatment.  Breast Cancer  Practice breast self-awareness. This means understanding how your breasts normally appear and feel.  It also means doing regular breast self-exams. Let your health care provider know about any changes, no matter how small.  If you are in your 20s or 30s, you should have a clinical breast exam (CBE) by a health care provider every 1-3 years as part of a regular health exam.  If you are 101 or older, have a CBE every year. Also consider having a breast X-ray (mammogram) every year.  If you have a family history of breast cancer, talk  to your health care provider about genetic screening.  If you are at high risk for breast cancer, talk to your health care provider about having an MRI and a mammogram every year.  Breast cancer gene (BRCA) assessment is recommended for women who have family members with BRCA-related cancers. BRCA-related cancers include: ? Breast. ? Ovarian. ? Tubal. ? Peritoneal cancers.  Results of the assessment will determine the need for genetic counseling and BRCA1 and BRCA2 testing.  Cervical Cancer Your health care provider  may recommend that you be screened regularly for cancer of the pelvic organs (ovaries, uterus, and vagina). This screening involves a pelvic examination, including checking for microscopic changes to the surface of your cervix (Pap test). You may be encouraged to have this screening done every 3 years, beginning at age 50.  For women ages 57-65, health care providers may recommend pelvic exams and Pap testing every 3 years, or they may recommend the Pap and pelvic exam, combined with testing for human papilloma virus (HPV), every 5 years. Some types of HPV increase your risk of cervical cancer. Testing for HPV may also be done on women of any age with unclear Pap test results.  Other health care providers may not recommend any screening for nonpregnant women who are considered low risk for pelvic cancer and who do not have symptoms. Ask your health care provider if a screening pelvic exam is right for you.  If you have had past treatment for cervical cancer or a condition that could lead to cancer, you need Pap tests and screening for cancer for at least 20 years after your treatment. If Pap tests have been discontinued, your risk factors (such as having a new sexual partner) need to be reassessed to determine if screening should resume. Some women have medical problems that increase the chance of getting cervical cancer. In these cases, your health care provider may recommend more frequent screening and Pap tests.  Colorectal Cancer  This type of cancer can be detected and often prevented.  Routine colorectal cancer screening usually begins at 46 years of age and continues through 46 years of age.  Your health care provider may recommend screening at an earlier age if you have risk factors for colon cancer.  Your health care provider may also recommend using home test kits to check for hidden blood in the stool.  A small camera at the end of a tube can be used to examine your colon directly  (sigmoidoscopy or colonoscopy). This is done to check for the earliest forms of colorectal cancer.  Routine screening usually begins at age 69.  Direct examination of the colon should be repeated every 5-10 years through 46 years of age. However, you may need to be screened more often if early forms of precancerous polyps or small growths are found.  Skin Cancer  Check your skin from head to toe regularly.  Tell your health care provider about any new moles or changes in moles, especially if there is a change in a mole's shape or color.  Also tell your health care provider if you have a mole that is larger than the size of a pencil eraser.  Always use sunscreen. Apply sunscreen liberally and repeatedly throughout the day.  Protect yourself by wearing long sleeves, pants, a wide-brimmed hat, and sunglasses whenever you are outside.  Heart disease, diabetes, and high blood pressure  High blood pressure causes heart disease and increases the risk of stroke. High blood pressure is more  likely to develop in: ? People who have blood pressure in the high end of the normal range (130-139/85-89 mm Hg). ? People who are overweight or obese. ? People who are African American.  If you are 20-26 years of age, have your blood pressure checked every 3-5 years. If you are 50 years of age or older, have your blood pressure checked every year. You should have your blood pressure measured twice-once when you are at a hospital or clinic, and once when you are not at a hospital or clinic. Record the average of the two measurements. To check your blood pressure when you are not at a hospital or clinic, you can use: ? An automated blood pressure machine at a pharmacy. ? A home blood pressure monitor.  If you are between 47 years and 46 years old, ask your health care provider if you should take aspirin to prevent strokes.  Have regular diabetes screenings. This involves taking a blood sample to check your  fasting blood sugar level. ? If you are at a normal weight and have a low risk for diabetes, have this test once every three years after 46 years of age. ? If you are overweight and have a high risk for diabetes, consider being tested at a younger age or more often. Preventing infection Hepatitis B  If you have a higher risk for hepatitis B, you should be screened for this virus. You are considered at high risk for hepatitis B if: ? You were born in a country where hepatitis B is common. Ask your health care provider which countries are considered high risk. ? Your parents were born in a high-risk country, and you have not been immunized against hepatitis B (hepatitis B vaccine). ? You have HIV or AIDS. ? You use needles to inject street drugs. ? You live with someone who has hepatitis B. ? You have had sex with someone who has hepatitis B. ? You get hemodialysis treatment. ? You take certain medicines for conditions, including cancer, organ transplantation, and autoimmune conditions.  Hepatitis C  Blood testing is recommended for: ? Everyone born from [birth year] through 1964. ? Anyone with known risk factors for hepatitis C.  Sexually transmitted infections (STIs)  You should be screened for sexually transmitted infections (STIs) including gonorrhea and chlamydia if: ? You are sexually active and are younger than 46 years of age. ? You are older than 46 years of age and your health care provider tells you that you are at risk for this type of infection. ? Your sexual activity has changed since you were last screened and you are at an increased risk for chlamydia or gonorrhea. Ask your health care provider if you are at risk.  If you do not have HIV, but are at risk, it may be recommended that you take a prescription medicine daily to prevent HIV infection. This is called pre-exposure prophylaxis (PrEP). You are considered at risk if: ? You are sexually active and do not regularly use condoms  or know the HIV status of your partner(s). ? You take drugs by injection. ? You are sexually active with a partner who has HIV.  Talk with your health care provider about whether you are at high risk of being infected with HIV. If you choose to begin PrEP, you should first be tested for HIV. You should then be tested every 3 months for as long as you are taking PrEP. Pregnancy  If you are premenopausal and you may  become pregnant, ask your health care provider about preconception counseling.  If you may become pregnant, take 400 to 800 micrograms (mcg) of folic acid every day.  If you want to prevent pregnancy, talk to your health care provider about birth control (contraception). Osteoporosis and menopause  Osteoporosis is a disease in which the bones lose minerals and strength with aging. This can result in serious bone fractures. Your risk for osteoporosis can be identified using a bone density scan.  If you are 85 years of age or older, or if you are at risk for osteoporosis and fractures, ask your health care provider if you should be screened.  Ask your health care provider whether you should take a calcium or vitamin D supplement to lower your risk for osteoporosis.  Menopause may have certain physical symptoms and risks.  Hormone replacement therapy may reduce some of these symptoms and risks. Talk to your health care provider about whether hormone replacement therapy is right for you. Follow these instructions at home:  Schedule regular health, dental, and eye exams.  Stay current with your immunizations.  Do not use any tobacco products including cigarettes, chewing tobacco, or electronic cigarettes.  If you are pregnant, do not drink alcohol.  If you are breastfeeding, limit how much and how often you drink alcohol.  Limit alcohol intake to no more than 1 drink per day for nonpregnant women. One drink equals 12 ounces of beer, 5 ounces of wine, or 1 ounces of hard  liquor.  Do not use street drugs.  Do not share needles.  Ask your health care provider for help if you need support or information about quitting drugs.  Tell your health care provider if you often feel depressed.  Tell your health care provider if you have ever been abused or do not feel safe at home. This information is not intended to replace advice given to you by your health care provider. Make sure you discuss any questions you have with your health care provider. Document Released: 06/12/2011 Document Revised: 05/04/2016 Document Reviewed: 08/31/2015 Elsevier Interactive Patient Education  [employer].

## 2018-02-22 NOTE — Progress Notes (Signed)
Subjective:    Patient ID: Molly Sanchez, female    DOB: Oct 26, 1971, 47 y.o.   MRN: [id number]  CC: Molly Sanchez is a 47 y.o. female who presents today for physical exam.    HPI: Worried Had been following with Molly Sanchez. Had been on keto diet. Interested in nutritionist.    No longer on metformin, wellbutrin. No h/o seizure, alcohol use.   Continues to follow with psychiatry for ADHD, depression.    Colorectal Cancer Screening: Colonoscopy 2018; told to come back in 5 years, 2023. Molly Sanchez. Had colonoscopy due to stomach pain at that time.  Breast Cancer Screening: Mammogram Due, last I can see is 2017.  Cervical Cancer Screening: UTD and normal per patient.  Bone Health screening/DEXA for 65+: No increased fracture risk. Defer screening at this time. Lung Cancer Screening: Doesn't have 30 year pack year history and age > 49 years. Immunizations       Tetanus - utd        Pneumococcal - not Candidate for.  Labs: Screening labs today. Exercise: Gets regular exercise. No CP, sob with exercise.  Alcohol use: none Smoking/tobacco use: Nonsmoker.  Regular dental exams: UTD Wears seat belt: Yes. Skin: no new lesions;  No h/o skin cancer.   HISTORY:  Past Medical History:  Diagnosis Date  . ADD (attention deficit disorder)   . ANA positive   . Antiphospholipid antibody syndrome ([place]) 11/15/2012  . Antiphospholipid antibody syndrome complicating pregnancy ([place])   . Back pain   . Breast fibroadenoma    history of  . Clotting disorder ([place])    with pregancy  . Depression   . Dysplasia of cervix, low grade (CIN 1) [id number]  . Fatigue   . Fibromyalgia   . GERD (gastroesophageal reflux disease)   . H/O varicella   . Hx of blood clots    During pregnancy  . Joint pain   . Raynaud's disease   . Rosacea   . Vitamin D deficiency     Past Surgical History:  Procedure Laterality Date  . CESAREAN SECTION    . GYNECOLOGIC CRYOSURGERY     of cervix  . right breast  fibroadenoma removed     Family History  Problem Relation Age of Onset  . Hypertension Mother   . Stroke Mother   . Heart attack Mother   . Diabetes Mother   . Hyperlipidemia Mother   . Heart disease Mother   . Depression Mother   . Anxiety disorder Mother   . Schizophrenia Mother   . Obesity Mother   . Mental illness Brother        schizophrenia  . Colon cancer Neg Hx   . Esophageal cancer Neg Hx   . Rectal cancer Neg Hx   . Stomach cancer Neg Hx   . Pancreatic cancer Neg Hx       ALLERGIES: Patient has no known allergies.  Current Outpatient Medications on File Prior to Visit  Medication Sig Dispense Refill  . amphetamine-dextroamphetamine (ADDERALL) 20 MG tablet   0  . desvenlafaxine (PRISTIQ) 50 MG 24 hr tablet Take 1 tablet by mouth daily.    Marland Kitchen buPROPion (WELLBUTRIN SR) 150 MG 12 hr tablet Take 1 tablet (150 mg total) by mouth daily. (Patient not taking: Reported on 08/08/2017) 30 tablet 0  . Vitamin D, Ergocalciferol, (DRISDOL) 50000 units CAPS capsule Take 1 capsule (50,000 Units total) by mouth every 7 (seven) days. (Patient not taking: Reported on 08/08/2017)  4 capsule 0   Current Facility-Administered Medications on File Prior to Visit  Medication Dose Route Frequency Provider Last Rate Last Dose  . 0.9 %  sodium chloride infusion  500 mL Intravenous Continuous Molly Sanchez, Molly Sanchez        Social History   Tobacco Use  . Smoking status: Never Smoker  . Smokeless tobacco: Never Used  Substance Use Topics  . Alcohol use: No    Alcohol/week: 0.6 oz    Types: 1 Glasses of wine per week    Frequency: Never    Comment: none   . Drug use: No    Review of Systems  Constitutional: Negative for chills, fever and unexpected weight change.  HENT: Negative for congestion.   Respiratory: Negative for cough.   Cardiovascular: Negative for chest pain, palpitations and leg swelling.  Gastrointestinal: Negative for nausea and vomiting.  Genitourinary: Negative for  dyspareunia and pelvic pain.  Musculoskeletal: Negative for arthralgias and myalgias.  Skin: Negative for rash.  Neurological: Negative for headaches.  Hematological: Negative for adenopathy.  Psychiatric/Behavioral: Negative for confusion.      Objective:    BP 108/74 (BP Location: Left Arm, Patient Position: Sitting, Cuff Size: Large)   Pulse 74   Temp (!) 97.3 F (36.3 C) (Oral)   Resp 20   Ht 5\' 6"  (1.676 m)   Wt 225 lb 6 oz (102.2 kg)   SpO2 100%   BMI 36.38 kg/m   BP Readings from Last 3 Encounters:  02/22/18 108/74  08/22/17 106/76  02/13/17 113/75   Wt Readings from Last 3 Encounters:  02/22/18 225 lb 6 oz (102.2 kg)  08/22/17 233 lb (105.7 kg)  08/08/17 224 lb 9.6 oz (101.9 kg)    Physical Exam  Constitutional: She appears well-developed and well-nourished.  Eyes: Conjunctivae are normal.  Neck: No thyroid mass and no thyromegaly present.  Cardiovascular: Normal rate, regular rhythm, normal heart sounds and normal pulses.  Pulmonary/Chest: Effort normal and breath sounds normal. She has no wheezes. She has no rhonchi. She has no rales.  Lymphadenopathy:       Head (right side): No submental, no submandibular, no tonsillar, no preauricular, no posterior auricular and no occipital adenopathy present.       Head (left side): No submental, no submandibular, no tonsillar, no preauricular, no posterior auricular and no occipital adenopathy present.    She has no cervical adenopathy.  Neurological: She is alert.  Skin: Skin is warm and dry.  Psychiatric: She has a normal mood and affect. Her speech is normal and behavior is normal. Thought content normal.  Vitals reviewed.      Assessment & Plan:   Problem List Items Addressed This Visit      Other   Routine general medical examination at a health care facility - Primary    Patient follows with OB/GYN.  She has no breast or pelvic complaints today.  She is due mammogram and will schedule. She politely declines  pelvic and clinical breast exam today in the office in the absence of complaints.  She would like to see dermatologist and that referral has been placed.  She also would like to see a nutritionist for further advice with keto diet.  Congratulated patient on weight loss- Weight was 242 at last visit with me in 2017.  She will let me know if would like to go back on Wellbutrin,  metformin which had been started by Molly. Leafy Sanchez.      Relevant  Orders   MM SCREENING BREAST TOMO BILATERAL   Lipid panel   Referral to Nutrition and Diabetes Services   Ambulatory referral to Dermatology   CBC with Differential/Platelet   Comprehensive metabolic panel   Hemoglobin A1c   TSH   VITAMIN D 25 Hydroxy (Vit-D Deficiency, Fractures)       I have discontinued Brittish B. Colson's metFORMIN. I am also having her maintain her amphetamine-dextroamphetamine, Vitamin D (Ergocalciferol), buPROPion, and desvenlafaxine. We will continue to administer sodium chloride.   No orders of the defined types were placed in this encounter.   Return precautions given.   Risks, benefits, and alternatives of the medications and treatment plan prescribed today were discussed, and patient expressed understanding.   Education regarding symptom management and diagnosis given to patient on AVS.   Continue to follow with Burnard Hawthorne, FNP for routine health maintenance.   Molly Sanchez and I agreed with plan.   Mable Paris, FNP

## 2018-02-22 NOTE — Progress Notes (Signed)
Pre-visit discussion using our clinic review tool. No additional management support is needed unless otherwise documented below in the visit note.  

## 2018-02-25 ENCOUNTER — Other Ambulatory Visit: Payer: Self-pay | Admitting: Family

## 2018-02-25 DIAGNOSIS — D6861 Antiphospholipid syndrome: Secondary | ICD-10-CM

## 2018-03-01 ENCOUNTER — Inpatient Hospital Stay: Payer: 59

## 2018-03-01 ENCOUNTER — Inpatient Hospital Stay: Payer: 59 | Attending: Hematology and Oncology | Admitting: Hematology and Oncology

## 2018-03-01 ENCOUNTER — Encounter: Payer: Self-pay | Admitting: Hematology and Oncology

## 2018-03-01 ENCOUNTER — Other Ambulatory Visit: Payer: Self-pay | Admitting: Hematology and Oncology

## 2018-03-01 VITALS — BP 108/74 | HR 88 | Temp 98.9°F | Resp 16 | Wt 230.2 lb

## 2018-03-01 DIAGNOSIS — D6861 Antiphospholipid syndrome: Secondary | ICD-10-CM | POA: Diagnosis not present

## 2018-03-01 DIAGNOSIS — N96 Recurrent pregnancy loss: Secondary | ICD-10-CM | POA: Diagnosis not present

## 2018-03-01 LAB — COMPREHENSIVE METABOLIC PANEL
ALT: 10 U/L — ABNORMAL LOW (ref 14–54)
AST: 17 U/L (ref 15–41)
Albumin: 4.1 g/dL (ref 3.5–5.0)
Alkaline Phosphatase: 61 U/L (ref 38–126)
Anion gap: 7 (ref 5–15)
BUN: 20 mg/dL (ref 6–20)
CO2: 24 mmol/L (ref 22–32)
Calcium: 8.9 mg/dL (ref 8.9–10.3)
Chloride: 106 mmol/L (ref 101–111)
Creatinine, Ser: 0.66 mg/dL (ref 0.44–1.00)
GFR calc Af Amer: >60 mL/min (ref >60)
GFR calc non Af Amer: >60 mL/min (ref >60)
Glucose, Bld: 86 mg/dL (ref 65–99)
Potassium: 3.8 mmol/L (ref 3.5–5.1)
Sodium: 137 mmol/L (ref 135–145)
Total Bilirubin: 1.1 mg/dL (ref 0.3–1.2)
Total Protein: 7.5 g/dL (ref 6.5–8.1)

## 2018-03-01 LAB — CBC WITH DIFFERENTIAL/PLATELET
Basophils Absolute: 0 10*3/uL (ref 0–0.1)
Basophils Relative: 1 %
Eosinophils Absolute: 0.1 10*3/uL (ref 0–0.7)
Eosinophils Relative: 2 %
HCT: 40.1 % (ref 35.0–47.0)
Hemoglobin: 13.4 g/dL (ref 12.0–16.0)
Lymphocytes Relative: 23 %
Lymphs Abs: 1.5 10*3/uL (ref 1.0–3.6)
MCH: 30.2 pg (ref 26.0–34.0)
MCHC: 33.5 g/dL (ref 32.0–36.0)
MCV: 90.1 fL (ref 80.0–100.0)
Monocytes Absolute: 0.6 10*3/uL (ref 0.2–0.9)
Monocytes Relative: 9 %
Neutro Abs: 4.1 10*3/uL (ref 1.4–6.5)
Neutrophils Relative %: 65 %
Platelets: 259 10*3/uL (ref 150–440)
RBC: 4.45 MIL/uL (ref 3.80–5.20)
RDW: 14.2 % (ref 11.5–14.5)
WBC: 6.3 10*3/uL (ref 3.6–11.0)

## 2018-03-01 NOTE — Progress Notes (Signed)
[facility] day:  03/01/2018  Chief Complaint: Molly Sanchez is a 47 y.o. female with antiphospholipid antibody syndrome who is referred in consultation by Mable Paris, NP for assessment and management.  HPI: Patient notes that she was diagnosed 8 years ago with antiphospholipid antibody syndrome at [facility] in [place], [place]. Patient suffered 3 miscarriages, which prompted her to do her own research. Patient is a G5-P2-A3s. Patient asked to be tested for antiphospholipid antibody syndrome. Testing found to be positive.   Patient conceived again after diagnosis. She was treated with ASA 81 mg and enoxaparin injections throughout her pregnancy. Patient developed a sub-chorionic hematoma and was put on bed rest for 4.5 months.  Patient gave birth to her son 8 years ago. She continued ASA therapy 6 weeks post-partum. Patient conceived a second time 1.5 years later. She was placed on the same regimen (ASA and enoxaparin). Patient's second child was delivered without complications.   Patient denies history of VTE.   Patient continues to have regular menstrual cycles every 28 days. Patient has had a positive hypercoagulable workup in the past.  She notes that her ANA is positive.   Patient complains of polyarthralgias. She sometimes has rashes and sensitivity to the sun. She describes a daily malar (butterfly) rash on her face. She has been seen by rheumatology (Dr Patrecia Pour) in [place]. She has been diagnosed with Raynaud's in the past. Patient notes age related visual changes. She experiences monthly episodes of "laryngitis" that last 5-7 days. She denies chest pain, shortness of breath, and palpitations. She has no vomiting, abdominal pain, or changes in bowel habits.   Patient is up to date on her colonoscopy (last was at [facility] in [place]). Patient's last mammogram was in 2018 (03/15/2016 in [facility]).  Maternal grandmother had a DVT.   Her mother suffered a CVA at the age of 43.  Mother has also had a MI.    Past Medical History:  Diagnosis Date  . ADD (attention deficit disorder)   . ANA positive   . Antiphospholipid antibody syndrome ([place]) 11/15/2012  . Antiphospholipid antibody syndrome complicating pregnancy ([place])   . Back pain   . Breast fibroadenoma    history of  . Clotting disorder ([place])    with pregancy  . Depression   . Dysplasia of cervix, low grade (CIN 1) [id number]  . Fatigue   . Fibromyalgia   . GERD (gastroesophageal reflux disease)   . H/O varicella   . Hx of blood clots    During pregnancy  . Joint pain   . Raynaud's disease   . Rosacea   . Vitamin D deficiency     Past Surgical History:  Procedure Laterality Date  . CESAREAN SECTION    . GYNECOLOGIC CRYOSURGERY     of cervix  . right breast fibroadenoma removed      Family History  Problem Relation Age of Onset  . Hypertension Mother   . Stroke Mother   . Heart attack Mother   . Diabetes Mother   . Hyperlipidemia Mother   . Heart disease Mother   . Depression Mother   . Anxiety disorder Mother   . Schizophrenia Mother   . Obesity Mother   . Mental illness Brother        schizophrenia  . Colon cancer Neg Hx   . Esophageal cancer Neg Hx   . Rectal cancer Neg Hx   . Stomach cancer Neg Hx   .  Pancreatic cancer Neg Hx     Social History:  reports that she has never smoked. She has never used smokeless tobacco. She reports that she does not drink alcohol or use drugs.  Patient lives in [place]. 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 She has 2 children (son age 90 and daughter age 19).  The patient is alone today.  Allergies: No Known Allergies  Current Medications: Current Outpatient Medications  Medication Sig Dispense Refill  . amphetamine-dextroamphetamine (ADDERALL) 20 MG tablet   0  . desvenlafaxine (PRISTIQ) 50 MG 24 hr tablet Take 1 tablet by mouth daily.    .  Vitamin D, Ergocalciferol, (DRISDOL) 50000 units CAPS capsule Take 1 capsule (50,000 Units total) by mouth every 7 (seven) days. 4 capsule 0   Current Facility-Administered Medications  Medication Dose Route Frequency Provider Last Rate Last Dose  . 0.9 %  sodium chloride infusion  500 mL Intravenous Continuous Pyrtle, Lajuan Lines, MD        Review of Systems:  GENERAL:  Feels good.  Active.  No fevers, sweats or weight loss. PERFORMANCE STATUS (ECOG):  0 HEENT:  Episodes of laryngitis.  No visual changes, runny nose, sore throat, mouth sores or tenderness. Lungs: No shortness of breath or cough.  No hemoptysis. Cardiac:  No chest pain, palpitations, orthopnea, or PND. GI:  No nausea, vomiting, diarrhea, constipation, melena or hematochezia. GU:  No urgency, frequency, dysuria, or hematuria.  Premenopausal. Musculoskeletal:  Polyarthralgias.  No back pain.  No muscle tenderness. Raynaud's. Extremities:  No pain or swelling. Skin:  Malar rash, daily.  Sun sensitivity.  Neuro:  No headache, numbness or weakness, balance or coordination issues. Endocrine:  No diabetes, thyroid issues, hot flashes or night sweats. Psych:  No mood changes, depression or anxiety. Pain:  No focal pain. Review of systems:  All other systems reviewed and found to be negative.  Physical Exam: Blood pressure 108/74, pulse 88, temperature 98.9 F (37.2 C), temperature source Tympanic, resp. rate 16, weight 230 lb 3.2 oz (104.4 kg). GENERAL:  Well developed, well nourished, woman sitting comfortably in the exam room in no acute distress. MENTAL STATUS:  Alert and oriented to person, place and time. HEAD:  Curly black hair.  Normocephalic, atraumatic, face symmetric, no Cushingoid features. EYES:  Brown eyes.  Pupils equal round and reactive to light and accomodation.  No conjunctivitis or scleral icterus. ENT:  Oropharynx clear without lesion.  Tongue normal. Mucous membranes moist.  RESPIRATORY:  Clear to auscultation  without rales, wheezes or rhonchi. CARDIOVASCULAR:  Regular rate and rhythm without murmur, rub or gallop. ABDOMEN:  Soft, non-tender, with active bowel sounds, and no appreciable hepatosplenomegaly.  No masses. SKIN:  No malar rash visible.  No ulcers or lesions. EXTREMITIES: No edema, no skin discoloration or tenderness.  No palpable cords. LYMPH NODES: No palpable cervical, supraclavicular, axillary or inguinal adenopathy  NEUROLOGICAL: Unremarkable. PSYCH:  Appropriate.   No visits with results within 3 Day(s) from this visit.  Latest known visit with results is:  Office Visit on 02/22/2018  Component Date Value Ref Range Status  . Cholesterol 02/22/2018 201* 0 - 200 mg/dL Final   ATP III Classification       Desirable:  < 200 mg/dL               Borderline High:  200 - 239 mg/dL          High:  > = 240 mg/dL  .  Triglycerides 02/22/2018 54.0  0.0 - 149.0 mg/dL Final   Normal:  <150 mg/dLBorderline High:  150 - 199 mg/dL  . HDL 02/22/2018 75.30  >39.00 mg/dL Final  . VLDL 02/22/2018 10.8  0.0 - 40.0 mg/dL Final  . LDL Cholesterol 02/22/2018 115* 0 - 99 mg/dL Final  . Total CHOL/HDL Ratio 02/22/2018 3   Final                  Men          Women1/2 Average Risk     3.4          3.3Average Risk          5.0          4.42X Average Risk          9.6          7.13X Average Risk          15.0          11.0                      . NonHDL 02/22/2018 125.93   Final   NOTE:  Non-HDL goal should be 30 mg/dL higher than patient's LDL goal (i.e. LDL goal of < 70 mg/dL, would have non-HDL goal of < 100 mg/dL)  . WBC 02/22/2018 5.5  4.0 - 10.5 K/uL Final  . RBC 02/22/2018 4.50  3.87 - 5.11 Mil/uL Final  . Hemoglobin 02/22/2018 14.0  12.0 - 15.0 g/dL Final  . HCT 02/22/2018 41.4  36.0 - 46.0 % Final  . MCV 02/22/2018 92.0  78.0 - 100.0 fl Final  . MCHC 02/22/2018 33.8  30.0 - 36.0 g/dL Final  . RDW 02/22/2018 14.2  11.5 - 15.5 % Final  . Platelets 02/22/2018 254.0  150.0 - 400.0 K/uL Final  .  Neutrophils Relative % 02/22/2018 65.7  43.0 - 77.0 % Final  . Lymphocytes Relative 02/22/2018 21.9  12.0 - 46.0 % Final  . Monocytes Relative 02/22/2018 9.4  3.0 - 12.0 % Final  . Eosinophils Relative 02/22/2018 1.9  0.0 - 5.0 % Final  . Basophils Relative 02/22/2018 1.1  0.0 - 3.0 % Final  . Neutro Abs 02/22/2018 3.6  1.4 - 7.7 K/uL Final  . Lymphs Abs 02/22/2018 1.2  0.7 - 4.0 K/uL Final  . Monocytes Absolute 02/22/2018 0.5  0.1 - 1.0 K/uL Final  . Eosinophils Absolute 02/22/2018 0.1  0.0 - 0.7 K/uL Final  . Basophils Absolute 02/22/2018 0.1  0.0 - 0.1 K/uL Final  . Sodium 02/22/2018 139  135 - 145 mEq/L Final  . Potassium 02/22/2018 4.1  3.5 - 5.1 mEq/L Final  . Chloride 02/22/2018 105  96 - 112 mEq/L Final  . CO2 02/22/2018 27  19 - 32 mEq/L Final  . Glucose, Bld 02/22/2018 92  70 - 99 mg/dL Final  . BUN 02/22/2018 13  6 - 23 mg/dL Final  . Creatinine, Ser 02/22/2018 0.73  0.40 - 1.20 mg/dL Final  . Total Bilirubin 02/22/2018 0.9  0.2 - 1.2 mg/dL Final  . Alkaline Phosphatase 02/22/2018 59  39 - 117 U/L Final  . AST 02/22/2018 13  0 - 37 U/L Final  . ALT 02/22/2018 10  0 - 35 U/L Final  . Total Protein 02/22/2018 7.2  6.0 - 8.3 g/dL Final  . Albumin 02/22/2018 4.1  3.5 - 5.2 g/dL Final  . Calcium 02/22/2018 9.3  8.4 - 10.5 mg/dL Final  .  GFR 02/22/2018 110.34  >60.00 mL/min Final  . Hgb A1c MFr Bld 02/22/2018 5.4  4.6 - 6.5 % Final   Glycemic Control Guidelines for People with Diabetes:Non Diabetic:  <6%Goal of Therapy: <7%Additional Action Suggested:  >8%   . TSH 02/22/2018 2.11  0.35 - 4.50 uIU/mL Final  . VITD 02/22/2018 13.92* 30.00 - 100.00 ng/mL Final    Assessment:  Molly Sanchez is a 47 y.o. female with a history of antiphospholipid antibody syndrome presenting with miscarriage x 3.  She had 2 successful pregnancies on aspirin and Lovenox.  Her first pregnancy was complicated by a sub-chorionic hematoma.  She has never had a thrombosis.  She appears to have lupus.  She  has a history of a + ANA.  She describes polyarthralgias, malar rash, and photosensitivity.  Symptomatically, she feels well.  Exam is unremarkable.  Plan: 1.  Discuss diagnosis of antiphospholipid antibody syndrome/lupus anticoagulant.  She states "all tests" were positive in the past.  Discuss obtaining old records (she will bring in).  Discuss retesting plus additional labs.  Discuss prophylactic anticoagulation for at risk times for thrombosis.  Discuss lifelong anticoagulation if she develops a clot.  Discuss signs and symptoms of thrombosis. 2.  Discuss likely diagnosis of lupus.  Discuss referral to rheumatology. 3.  Labs today: CBC with diff, CMP, ANA with reflex, Factor V Leiden, prothrombin gene mutation, anticardiolipin antibodies, beta-2 glycoprotein, lupus anticoagulant panel, C3, C4. 4.  Refer to rheumatology (Dr Jefm Bryant). 5.  RTC in 2 weeks for MD assessment and review of workup.    Honor Loh, NP  03/01/2018, 10:21 AM   I saw and evaluated the patient, participating in the key portions of the service and reviewing pertinent diagnostic studies and records.  I reviewed the nurse practitioner's note and agree with the findings and the plan.  The assessment and plan were discussed with the patient.  Multiple questions were asked by the patient and answered.   Nolon Stalls, MD 03/01/2018, 10:21 AM

## 2018-03-02 LAB — CARDIOLIPIN ANTIBODIES, IGG, IGM, IGA
Anticardiolipin IgA: <9 APL U/mL (ref 0–11)
Anticardiolipin IgG: <9 GPL U/mL (ref 0–14)
Anticardiolipin IgM: 22 MPL U/mL — ABNORMAL HIGH (ref 0–12)

## 2018-03-02 LAB — C3 COMPLEMENT: C3 Complement: 154 mg/dL (ref 82–167)

## 2018-03-02 LAB — C4 COMPLEMENT: COMPLEMENT C4, BODY FLUID: 32 mg/dL (ref 14–44)

## 2018-03-02 LAB — BETA-2-GLYCOPROTEIN I ABS, IGG/M/A
Beta-2 Glyco I IgG: <9 GPI IgG units (ref 0–20)
Beta-2-Glycoprotein I IgA: <9 GPI IgA units (ref 0–25)
Beta-2-Glycoprotein I IgM: <9 GPI IgM units (ref 0–32)

## 2018-03-02 LAB — ANA W/REFLEX: Anti Nuclear Antibody(ANA): NEGATIVE

## 2018-03-03 LAB — LUPUS ANTICOAGULANT PANEL
DRVVT: 36.2 s (ref 0.0–47.0)
PTT Lupus Anticoagulant: 45.2 s (ref 0.0–51.9)

## 2018-03-06 LAB — PROTHROMBIN GENE MUTATION

## 2018-03-07 DIAGNOSIS — M549 Dorsalgia, unspecified: Secondary | ICD-10-CM | POA: Diagnosis not present

## 2018-03-07 DIAGNOSIS — I7300 Raynaud's syndrome without gangrene: Secondary | ICD-10-CM | POA: Diagnosis not present

## 2018-03-07 DIAGNOSIS — G8929 Other chronic pain: Secondary | ICD-10-CM | POA: Diagnosis not present

## 2018-03-07 DIAGNOSIS — M545 Low back pain: Secondary | ICD-10-CM | POA: Diagnosis not present

## 2018-03-07 DIAGNOSIS — D6862 Lupus anticoagulant syndrome: Secondary | ICD-10-CM | POA: Diagnosis not present

## 2018-03-08 ENCOUNTER — Encounter: Payer: Self-pay | Admitting: Family

## 2018-03-08 LAB — FACTOR 5 LEIDEN

## 2018-03-11 ENCOUNTER — Other Ambulatory Visit: Payer: Self-pay | Admitting: Rheumatology

## 2018-03-11 ENCOUNTER — Ambulatory Visit: Payer: Self-pay | Admitting: Dietician

## 2018-03-11 DIAGNOSIS — D6862 Lupus anticoagulant syndrome: Secondary | ICD-10-CM

## 2018-03-11 DIAGNOSIS — I7300 Raynaud's syndrome without gangrene: Secondary | ICD-10-CM

## 2018-03-14 ENCOUNTER — Inpatient Hospital Stay: Payer: 59 | Admitting: Hematology and Oncology

## 2018-03-14 ENCOUNTER — Ambulatory Visit: Payer: 59

## 2018-03-19 ENCOUNTER — Encounter: Payer: Self-pay | Admitting: Dietician

## 2018-03-22 ENCOUNTER — Inpatient Hospital Stay: Payer: 59 | Attending: Hematology and Oncology | Admitting: Hematology and Oncology

## 2018-03-22 ENCOUNTER — Encounter: Payer: Self-pay | Admitting: Hematology and Oncology

## 2018-03-22 ENCOUNTER — Other Ambulatory Visit: Payer: Self-pay

## 2018-03-22 ENCOUNTER — Ambulatory Visit
Admission: RE | Admit: 2018-03-22 | Discharge: 2018-03-22 | Disposition: A | Discharge status: Home / Self Care | Payer: 59 | Source: Ambulatory Visit | Attending: Rheumatology | Admitting: Rheumatology

## 2018-03-22 VITALS — BP 130/78 | HR 98 | Temp 98.1°F | Resp 18 | Wt 230.0 lb

## 2018-03-22 DIAGNOSIS — F329 Major depressive disorder, single episode, unspecified: Secondary | ICD-10-CM | POA: Diagnosis not present

## 2018-03-22 DIAGNOSIS — M797 Fibromyalgia: Secondary | ICD-10-CM | POA: Insufficient documentation

## 2018-03-22 DIAGNOSIS — K219 Gastro-esophageal reflux disease without esophagitis: Secondary | ICD-10-CM | POA: Diagnosis not present

## 2018-03-22 DIAGNOSIS — I071 Rheumatic tricuspid insufficiency: Secondary | ICD-10-CM | POA: Insufficient documentation

## 2018-03-22 DIAGNOSIS — D6861 Antiphospholipid syndrome: Secondary | ICD-10-CM | POA: Diagnosis not present

## 2018-03-22 DIAGNOSIS — I7300 Raynaud's syndrome without gangrene: Secondary | ICD-10-CM | POA: Insufficient documentation

## 2018-03-22 DIAGNOSIS — Z7189 Other specified counseling: Secondary | ICD-10-CM

## 2018-03-22 DIAGNOSIS — D6862 Lupus anticoagulant syndrome: Secondary | ICD-10-CM | POA: Insufficient documentation

## 2018-03-22 DIAGNOSIS — Z86718 Personal history of other venous thrombosis and embolism: Secondary | ICD-10-CM | POA: Diagnosis not present

## 2018-03-22 NOTE — Progress Notes (Signed)
[facility] day:  03/22/2018  Chief Complaint: Molly Sanchez is a 47 y.o. female with antiphospholipid antibody syndrome who seen for review of work-up and discussion regarding direction of therapy.  HPI:  The patient was last seen in the hematology clinic on 03/01/2018.  She gave a history of antiphospholipid antibody syndrome presenting with miscarriage x 3. Symptomatically, she noted polyarthralgias, malar rash, and photosensitivity.  Labs revealed a hematocrit of 40.1, hemoglobin 13.4, MCV 90.1, platelets 259,000, WBC 6300 with an ANC of 4100.  CMP was normal.  Lupus anticoagulant panel was normal.  Anti-cardiolipin IgM was 22 (low-med positive).  Beta 2 glycoproiein antibodies were negative.  ANA was negative.  C3 and C4 were normal.  Factor V Leiden and prothrombin gene mutation were negative.  She was seen by Dr. Jefm Bryant on 03/07/2018.  She had Raynaud's, mild sclerodactyly, and nailfold telangectasias.  Sed rate was 31.  CRP was 10.8.  CK was 133. Anti-scleroderma-70 was negative.  Cryglobulins were not done.  There was no monoclonal spike.  IgG was 1204, IgA 265, and IgM 166.  CXR was performed (no result available).  Echo on 03/22/2018 revealed an EF of 60-65%..  There was mild LVH.  There was no cardiac source of emboli.  During the interim, she has done well.  She voices no complaints.   Past Medical History:  Diagnosis Date  . ADD (attention deficit disorder)   . ANA positive   . Antiphospholipid antibody syndrome ([place]) 11/15/2012  . Antiphospholipid antibody syndrome complicating pregnancy ([place])   . Back pain   . Breast fibroadenoma    history of  . Clotting disorder ([place])    with pregancy  . Depression   . Dysplasia of cervix, low grade (CIN 1) [id number]  . Fatigue   . Fibromyalgia   . GERD (gastroesophageal reflux disease)   . H/O varicella   . Hx of blood clots    During pregnancy  . Joint pain   . Raynaud's disease    . Rosacea   . Vitamin D deficiency     Past Surgical History:  Procedure Laterality Date  . CESAREAN SECTION    . GYNECOLOGIC CRYOSURGERY     of cervix  . right breast fibroadenoma removed      Family History  Problem Relation Age of Onset  . Hypertension Mother   . Stroke Mother   . Heart attack Mother   . Diabetes Mother   . Hyperlipidemia Mother   . Heart disease Mother   . Depression Mother   . Anxiety disorder Mother   . Schizophrenia Mother   . Obesity Mother   . Mental illness Brother        schizophrenia  . Colon cancer Neg Hx   . Esophageal cancer Neg Hx   . Rectal cancer Neg Hx   . Stomach cancer Neg Hx   . Pancreatic cancer Neg Hx     Social History:  reports that she has never smoked. She has never used smokeless tobacco. She reports that she does not drink alcohol or use drugs.  Patient lives in [place]. 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 She has 2 children (son age 65 and daughter age 37).  The patient is alone today.  Allergies: No Known Allergies  Current Medications: Current Outpatient Medications  Medication Sig Dispense Refill  . amphetamine-dextroamphetamine (ADDERALL) 20 MG tablet   0  . desvenlafaxine (  PRISTIQ) 50 MG 24 hr tablet Take 1 tablet by mouth daily.     Current Facility-Administered Medications  Medication Dose Route Frequency Provider Last Rate Last Dose  . 0.9 %  sodium chloride infusion  500 mL Intravenous Continuous Pyrtle, Lajuan Lines, MD        Review of Systems:  GENERAL:  Feels good.  Active.  No fevers, sweats or weight loss. PERFORMANCE STATUS (ECOG):  0 HEENT:  Episodes of laryngitis.  No visual changes, runny nose, sore throat, mouth sores or tenderness. Lungs: No shortness of breath or cough.  No hemoptysis. Cardiac:  No chest pain, palpitations, orthopnea, or PND. GI:  No nausea, vomiting, diarrhea, constipation, melena or hematochezia. GU:  No urgency, frequency,  dysuria, or hematuria.  Premenopausal. Musculoskeletal:  Polyarthralgias.  No back pain.  No muscle tenderness. Raynaud's. Extremities:  No pain or swelling. Skin:  Malar rash, daily.  Sun sensitivity.  Neuro:  No headache, numbness or weakness, balance or coordination issues. Endocrine:  No diabetes, thyroid issues, hot flashes or night sweats. Psych:  No mood changes, depression or anxiety. Pain:  No focal pain. Review of systems:  All other systems reviewed and found to be negative.  Physical Exam: Blood pressure 130/78, pulse 98, temperature 98.1 F (36.7 C), temperature source Tympanic, resp. rate 18, weight 230 lb (104.3 kg). GENERAL:  Well developed, well nourished, woman sitting comfortably in the exam room in no acute distress. MENTAL STATUS:  Alert and oriented to person, place and time. HEAD:  Curly black hair.  Normocephalic, atraumatic, face symmetric, no Cushingoid features. EYES:  Brown eyes.  No conjunctivitis or scleral icterus. SKIN:  No malar rash visible. EXTREMITIES:  No visible  nailfold telangectasias.  No edema, no skin discoloration or tenderness.  NEUROLOGICAL: Unremarkable. PSYCH:  Appropriate.   No visits with results within 3 Day(s) from this visit.  Latest known visit with results is:  Appointment on 03/01/2018  Component Date Value Ref Range Status  . Complement C4, Body Fluid 03/01/2018 32  14 - 44 mg/dL Final   Comment: (NOTE) Performed At: [facility] [place], [place] [id number] Rush Farmer MD EH:[id number] Performed at [facility], [address] [place], [place] [address]   . C3 Complement 03/01/2018 154  82 - 167 mg/dL Final   Comment: (NOTE) Performed At: [facility] [place], [place] [id number] Rush Farmer MD NO:[id number] Performed at [facility], [address], [place], [place] [address]   . Anit Nuclear Antibody(ANA) 03/01/2018 Negative  Negative Final    Comment: (NOTE) Performed At: [facility] [place], [place] [id number] Rush Farmer MD YT:[id number] Performed at [facility], [address], [place], [place] [address]   . Recommendations-PTGENE: 03/01/2018 Comment   Final   Comment: (NOTE) NEGATIVE No mutation identified. Comment: A point mutation (G20210A) in the factor II (prothrombin) gene is the second most common cause of inherited thrombophilia. The incidence of this mutation in the [place] Caucasian population is about 2% and in the [place] American population it is approximately 0.5%. This mutation is rare in the [place] and Native American population. Being heterozygous for a prothrombin mutation increases the risk for developing venous thrombosis about 2 to 3 times above the general population risk. Being homozygous for the prothrombin gene mutation increases the relative risk for venous thrombosis further, although it is not yet known how much further the risk is increased. In women heterozygous for the prothrombin gene  mutation, the use of estrogen containing oral contraceptives increases the relative risk of venous thrombosis about 16 times and the risk of developing cerebral thrombosis is also significantly increased. In pregnancy the pr                          othrombin gene mutation increases risk for venous thrombosis and may increase risk for stillbirth, placental abruption, pre-eclampsia and fetal growth restriction. If the patient possesses two or more congenital or acquired thrombophilic risk factors, the risk for thrombosis may rise to more than the sum of the risk ratios for the individual mutations. This assay detects only the prothrombin G20210A mutation and does not measure genetic abnormalities elsewhere in the genome. Other thrombotic risk factors may be pursued through systematic clinical laboratory analysis. These factors include the R506Q (Leiden) mutation in the  Factor V gene, plasma homocysteine levels, as well as testing for deficiencies of antithrombin III, protein C and protein S. Genetic Counselors are available for health care providers to discuss results at [phone]-GENE [phone]). Methodology: DNA analysis of the Factor II gene was performed by PCR amplification followed by restriction analysis. The di                          agnostic sensitivity is >99% for both. All the tests must be combined with clinical information for the most accurate interpretation. Molecular-based testing is highly accurate, but as in any laboratory test, diagnostic errors may occur. This test was developed and its performance characteristics determined by [facility]. It has not been cleared or approved by the Food and Drug Administration. Poort SR, et al. Blood. 1996; 87:5643-3295. Varga EA. Circulation. 2004; 188:C16-S06. Mervin Hack, et [place]; 19:700-703. Allison Quarry, PhD, [facility] Ruben Reason, PhD, [facility] Annetta Maw, M.S., PhD, [facility] Alfredo Bach, PhD, [facility] Norva Riffle, PhD, [facility] Earlean Polka, PhD, [facility] Performed At: [facility] [address] [place], [place] [id number] Nechama Guard MD AT:[id number] Performed at [facility], [address] [place], [place] [address]   . Recommendations-F5LEID: 03/01/2018 Comment   Final   Comment: (NOTE) Result:  Negative (no mutation found) Factor V Leiden is a specific mutation (R506Q) in the factor V gene that is associated with an increased risk of venous thrombosis. Factor V Leiden is more resistant to inactivation by activated protein C.  As a result, factor V persists in the circulation leading to a mild hyper- coagulable state.  The Leiden mutation accounts for 90% - 95% of APC resistance.  Factor V Leiden has been reported in patients with deep vein thrombosis, pulmonary embolus, central retinal vein occlusion, cerebral sinus  thrombosis and hepatic vein thrombosis. Other risk factors to be considered in the workup for venous thrombosis include the G20210A mutation in the factor II (prothrombin) gene, protein S and C deficiency, and antithrombin deficiencies. Anticardiolipin antibody and lupus anticoagulant analysis may be appropriate for certain patients, as well as homocysteine levels. Contact your local [facility] for information on how to order additi                          onal testing if desired. **Genetic counselors are available for health care providers to**  discuss results at [phone]-GENE [phone]). Methodology: DNA analysis of the Factor V gene was performed by allele-specific PCR. The diagnostic sensitivity and specificity is >99% for both.  Molecular-based testing is highly accurate, but as in any laboratory test, diagnostic errors may occur. All test results must be combined with clinical information for the most accurate interpretation. This test was developed and its performance characteristics determined by [facility]. It has not been cleared or approved by the Food and Drug Administration. References: Voelkerding K (1996).  Clin Lab Med [phone]. Allison Quarry, PhD, [facility] Ruben Reason, PhD, [facility] Annetta Maw, M.S., PhD, [facility] Alfredo Bach, PhD, [facility] Norva Riffle, PhD, [facility] Earlean Polka PhD, [facility] Performed At: [facility] [address] [place], [place] [id number] Nechama Guard MD GY:[id number]                          0 Performed at [facility], [address] [place], [place] [address]   . Beta-2 Glyco I IgG 03/01/2018 <9  0 - 20 GPI IgG units Final   Comment: (NOTE) The reference interval reflects a 3SD or 99th percentile interval, which is thought to represent a potentially clinically significant result in accordance with the International Consensus Statement on the classification criteria for definitive antiphospholipid syndrome (APS). J  Thromb Haem 2006;4:295-306.   . Beta-2-Glycoprotein I IgM 03/01/2018 <9  0 - 32 GPI IgM units Final   Comment: (NOTE) The reference interval reflects a 3SD or 99th percentile interval, which is thought to represent a potentially clinically significant result in accordance with the International Consensus Statement on the classification criteria for definitive antiphospholipid syndrome (APS). J Thromb Haem 2006;4:295-306. Performed At: [facility] [place], [place] [id number] Rush Farmer MD [id number]   . Beta-2-Glycoprotein I IgA 03/01/2018 <9  0 - 25 GPI IgA units Final   Comment: (NOTE) The reference interval reflects a 3SD or 99th percentile interval, which is thought to represent a potentially clinically significant result in accordance with the International Consensus Statement on the classification criteria for definitive antiphospholipid syndrome (APS). J Thromb Haem 2006;4:295-306. Performed at [facility], [address] [place], [place] [address]   . Anticardiolipin IgG 03/01/2018 <9  0 - 14 GPL U/mL Final   Comment: (NOTE)                          Negative:              <15                          Indeterminate:     15 - 20                          Low-Med Positive: >20 - 80                          High Positive:         >80   . Anticardiolipin IgM 03/01/2018 22* 0 - 12 MPL U/mL Final   Comment: (NOTE)                          Negative:              <13                          Indeterminate:     13 - 20  Low-Med Positive: >20 - 80                          High Positive:         >80   . Anticardiolipin IgA 03/01/2018 <9  0 - 11 APL U/mL Final   Comment: (NOTE)                          Negative:              <12                          Indeterminate:     12 - 20                          Low-Med Positive: >20 - 80                          High Positive:         >80 Performed At: [facility] [address] [place], [place] [id number] Rush Farmer MD [id number]:[id number] Performed at [facility], [address] [place], [place] [address]   . PTT Lupus Anticoagulant 03/01/2018 45.2  0.0 - 51.9 sec Final  . DRVVT 03/01/2018 36.2  0.0 - 47.0 sec Final  . Lupus Anticoag Interp 03/01/2018 Comment:   Corrected   Comment: (NOTE) No lupus anticoagulant was detected. Performed At: [facility] [place], [place] [id number] Rush Farmer MD VX:[id number] Performed at [facility], [address] [place], [place] [address]   . Sodium 03/01/2018 137  135 - 145 mmol/L Final  . Potassium 03/01/2018 3.8  3.5 - 5.1 mmol/L Final  . Chloride 03/01/2018 106  101 - 111 mmol/L Final  . CO2 03/01/2018 24  22 - 32 mmol/L Final  . Glucose, Bld 03/01/2018 86  65 - 99 mg/dL Final  . BUN 03/01/2018 20  6 - 20 mg/dL Final  . Creatinine, Ser 03/01/2018 0.66  0.44 - 1.00 mg/dL Final  . Calcium 03/01/2018 8.9  8.9 - 10.3 mg/dL Final  . Total Protein 03/01/2018 7.5  6.5 - 8.1 g/dL Final  . Albumin 03/01/2018 4.1  3.5 - 5.0 g/dL Final  . AST 03/01/2018 17  15 - 41 U/L Final  . ALT 03/01/2018 10* 14 - 54 U/L Final  . Alkaline Phosphatase 03/01/2018 61  38 - 126 U/L Final  . Total Bilirubin 03/01/2018 1.1  0.3 - 1.2 mg/dL Final  . GFR calc non Af Amer 03/01/2018 >60  >60 mL/min Final  . GFR calc Af Amer 03/01/2018 >60  >60 mL/min Final   Comment: (NOTE) The eGFR has been calculated using the CKD EPI equation. This calculation has not been validated in all clinical situations. eGFR's persistently <60 mL/min signify possible Chronic Kidney Disease.   Georgiann Hahn gap 03/01/2018 7  5 - 15 Final   Performed at [facility], [address] [place], [place] [address]  . WBC 03/01/2018 6.3  3.6 - 11.0 K/uL Final  . RBC 03/01/2018 4.45  3.80 - 5.20 MIL/uL Final  . Hemoglobin 03/01/2018 13.4  12.0 - 16.0 g/dL Final  . HCT 03/01/2018 40.1  35.0 - 47.0 % Final   . MCV 03/01/2018 90.1  80.0 - 100.0 fL Final  . [facility] 03/01/2018  30.2  26.0 - 34.0 pg Final  . MCHC 03/01/2018 33.5  32.0 - 36.0 g/dL Final  . RDW 03/01/2018 14.2  11.5 - 14.5 % Final  . Platelets 03/01/2018 259  150 - 440 K/uL Final  . Neutrophils Relative % 03/01/2018 65  % Final  . Neutro Abs 03/01/2018 4.1  1.4 - 6.5 K/uL Final  . Lymphocytes Relative 03/01/2018 23  % Final  . Lymphs Abs 03/01/2018 1.5  1.0 - 3.6 K/uL Final  . Monocytes Relative 03/01/2018 9  % Final  . Monocytes Absolute 03/01/2018 0.6  0.2 - 0.9 K/uL Final  . Eosinophils Relative 03/01/2018 2  % Final  . Eosinophils Absolute 03/01/2018 0.1  0 - 0.7 K/uL Final  . Basophils Relative 03/01/2018 1  % Final  . Basophils Absolute 03/01/2018 0.0  0 - 0.1 K/uL Final   Performed at [facility], [address] [place], [place] [address]    Assessment:  Molly Sanchez is a 47 y.o. female with a history of antiphospholipid antibody syndrome presenting with miscarriage x 3.  She had 2 successful pregnancies on aspirin and Lovenox.  Her first pregnancy was complicated by a sub-chorionic hematoma.  She has never had a thrombosis.  She may have an underlying connective tissue disorder.  She has Raynaud's, mild sclerodactyly, and nailfold telangectasias.  She describes polyarthralgias, malar rash, and photosensitivity.  She is followed by Dr.Kernodle.  Sed rate was 31.  CRP was 10.8.  CK was 133.  Anti-scleroderma-70 was negative. There was no monoclonal spike.  IgG was 1204, IgA 265, and IgM 166.  Echo on 03/22/2018 revealed an EF of 60-65%..  There was mild LVH.  There was no cardiac source of emboli.  Work-up on 03/01/2018 revealed a hematocrit of 40.1, hemoglobin 13.4, MCV 90.1, platelets 259,000, WBC 6300 with an ANC of 4100.  CMP was normal.  Lupus anticoagulant panel was normal.  Anti-cardiolipin IgM was 22 (low-med positive).  Beta 2 glycoproiein antibodies were negative.  ANA was negative.  C3 and C4 were normal.   Factor V Leiden and prothrombin gene mutation were negative.  Symptomatically, she feels well.  Exam is unremarkable.  Plan: 1.  Discuss work-up.  No current evidence of a lupus anticoagulant/anti-phospholipid antibody syndrome.  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 2.  Follow-up with Dr. Jefm Bryant as scheduled. 3.  RTC in 3 months for MD assessment and labs (CBC with diff, CMP, anti-cardiolipin antibodies).   Honor Loh, NP  03/22/2018, 4:35 PM   I saw and evaluated the patient, participating in the key portions of the service and reviewing pertinent diagnostic studies and records.  I reviewed the nurse practitioner's note and agree with the findings and the plan.  The assessment and plan were discussed with the patient.  Several questions were asked by the patient and answered.   Nolon Stalls, MD 03/22/2018, 4:35 PM

## 2018-03-22 NOTE — Progress Notes (Signed)
Pt here for follow up. Overall feels "great."

## 2018-03-22 NOTE — Progress Notes (Signed)
*  PRELIMINARY RESULTS* Echocardiogram 2D Echocardiogram has been performed.  Sherrie Sport 03/22/2018, 10:48 AM

## 2018-03-28 DIAGNOSIS — M542 Cervicalgia: Secondary | ICD-10-CM | POA: Diagnosis not present

## 2018-03-28 DIAGNOSIS — I7300 Raynaud's syndrome without gangrene: Secondary | ICD-10-CM | POA: Diagnosis not present

## 2018-03-28 DIAGNOSIS — M1990 Unspecified osteoarthritis, unspecified site: Secondary | ICD-10-CM | POA: Diagnosis not present

## 2018-05-03 MED FILL — DEXTROAMP-AMPHETAMIN 20 MG: 20 | 14 days supply | Qty: 56 | Fill #0

## 2018-05-17 MED FILL — AMPHETAMINE SALTS 20 MG TAB: 20 | 76 days supply | Qty: 304 | Fill #0

## 2018-06-24 ENCOUNTER — Other Ambulatory Visit: Payer: Self-pay | Admitting: *Deleted

## 2018-06-24 ENCOUNTER — Other Ambulatory Visit: Payer: Self-pay

## 2018-06-24 ENCOUNTER — Ambulatory Visit: Payer: Self-pay | Admitting: Urgent Care

## 2018-06-24 DIAGNOSIS — D6861 Antiphospholipid syndrome: Secondary | ICD-10-CM

## 2018-06-24 NOTE — Progress Notes (Deleted)
[facility] day:  06/24/2018  Chief Complaint: Molly Sanchez is a 47 y.o. female with antiphospholipid antibody syndrome who seen for 3 month assessment.  HPI:  The patient was last seen in the hematology clinic on 03/22/2018.  At that time, patient was doing well. She verbalized no acute complaints. Exam is unremarkable. Anti-cardiolipin IgM was 22 (low-med positive).  She was seen in follow up consult on 03/28/2018 by Dr. Cristi Loron (rheumatology). Notes reviewed. 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 She complained of neck pain. Plain films of her cervical spine were ordered, however never performed. She was started on Plaquenil 200 mg daily, in addition to Mobic 7.5 mg daily PRN. She will follow up with rheumatology on 06/27/2018.  In the interim,  Past Medical History:  Diagnosis Date  . ADD (attention deficit disorder)   . ANA positive   . Antiphospholipid antibody syndrome ([place]) 11/15/2012  . Antiphospholipid antibody syndrome complicating pregnancy ([place])   . Back pain   . Breast fibroadenoma    history of  . Clotting disorder ([place])    with pregancy  . Depression   . Dysplasia of cervix, low grade (CIN 1) [id number]  . Fatigue   . Fibromyalgia   . GERD (gastroesophageal reflux disease)   . H/O varicella   . Hx of blood clots    During pregnancy  . Joint pain   . Raynaud's disease   . Rosacea   . Vitamin D deficiency     Past Surgical History:  Procedure Laterality Date  . CESAREAN SECTION    . GYNECOLOGIC CRYOSURGERY     of cervix  . right breast fibroadenoma removed      Family History  Problem Relation Age of Onset  . Hypertension Mother   . Stroke Mother   . Heart attack Mother   . Diabetes Mother   . Hyperlipidemia Mother   . Heart disease Mother   . Depression Mother   . Anxiety disorder Mother   . Schizophrenia Mother   .  Obesity Mother   . Mental illness Brother        schizophrenia  . Colon cancer Neg Hx   . Esophageal cancer Neg Hx   . Rectal cancer Neg Hx   . Stomach cancer Neg Hx   . Pancreatic cancer Neg Hx     Social History:  reports that she has never smoked. She has never used smokeless tobacco. She reports that she does not drink alcohol or use drugs.  Patient lives in [place]. 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 She has 2 children (son age 73 and daughter age 48).  The patient is alone today.  Allergies: No Known Allergies  Current Medications: Current Outpatient Medications  Medication Sig Dispense Refill  . amphetamine-dextroamphetamine (ADDERALL) 20 MG tablet   0  . desvenlafaxine (PRISTIQ) 50 MG 24 hr tablet Take 1 tablet by mouth daily.     Current Facility-Administered Medications  Medication Dose Route Frequency Provider Last Rate Last Dose  . 0.9 %  sodium chloride infusion  500 mL Intravenous Continuous Pyrtle, Lajuan Lines, MD        Review of Systems  Constitutional: Negative for diaphoresis, fever, malaise/fatigue and weight loss.  HENT: Negative.   Eyes: Negative.   Respiratory: Negative for cough, hemoptysis, sputum production and shortness of breath.   Cardiovascular: Negative for chest pain,  palpitations, orthopnea, leg swelling and PND.  Gastrointestinal: Negative for abdominal pain, blood in stool, constipation, diarrhea, melena, nausea and vomiting.  Genitourinary: Negative for dysuria, frequency, hematuria and urgency.  Musculoskeletal: Positive for joint pain (polyarthralgia). Negative for back pain, falls and myalgias.       Raynaud's  Skin: Negative for itching and rash.       Malar rash (daily). Sun sensitivity. Sclerodactyly.  Neurological: Negative for dizziness, tremors, weakness and headaches.  Endo/Heme/Allergies: Does not bruise/bleed easily.  Psychiatric/Behavioral: Negative for depression, memory loss and  suicidal ideas. The patient is not nervous/anxious and does not have insomnia.   All other systems reviewed and are negative.  Performance status (ECOG): 0 - Asymptomatic  Vital Signs There were no vitals taken for this visit.  Physical Exam  Constitutional: She is oriented to person, place, and time and well-developed, well-nourished, and in no distress.  HENT:  Head: Normocephalic and atraumatic.  Black hair  Eyes: Pupils are equal, round, and reactive to light. EOM are normal. No scleral icterus.  Brown eyes  Neck: Normal range of motion. Neck supple. No tracheal deviation present. No thyromegaly present.  Cardiovascular: Normal rate, regular rhythm and normal heart sounds. Exam reveals no gallop and no friction rub.  No murmur heard. Pulmonary/Chest: Effort normal and breath sounds normal. No respiratory distress. She has no wheezes. She has no rales.  Abdominal: Soft. Bowel sounds are normal. She exhibits no distension. There is no tenderness.  Musculoskeletal: Normal range of motion. She exhibits no edema or tenderness.  Lymphadenopathy:    She has no cervical adenopathy.    She has no axillary adenopathy.       Right: No inguinal and no supraclavicular adenopathy present.       Left: No inguinal and no supraclavicular adenopathy present.  Neurological: She is alert and oriented to person, place, and time.  Skin: Skin is warm and dry. Rash (Malar) noted. No erythema.  Sclerodactyly. Nail fold telangectasias.  Psychiatric: Mood, affect and judgment normal.  Nursing note and vitals reviewed.   No visits with results within 3 Day(s) from this visit.  Latest known visit with results is:  Appointment on 03/01/2018  Component Date Value Ref Range Status  . Complement C4, Body Fluid 03/01/2018 32  14 - 44 mg/dL Final   Comment: (NOTE) Performed At: [facility] [place], [place] [id number] Rush Farmer MD [id number] Performed at [facility],  [address], [place], [place] [address]   . C3 Complement 03/01/2018 154  82 - 167 mg/dL Final   Comment: (NOTE) Performed At: [facility] [place], [place] [id number] Rush Farmer MD [id number] Performed at [facility], [address] [place], [place] [address]   . Anti Nuclear Antibody(ANA) 03/01/2018 Negative  Negative Final   Comment: (NOTE) Performed At: [facility] [place], [place] [id number] Rush Farmer MD TZ:[id number] Performed at [facility], [address] [place], [place] [address]   . Recommendations-PTGENE: 03/01/2018 Comment   Final   Comment: (NOTE) NEGATIVE No mutation identified. Comment: A point mutation (G20210A) in the factor II (prothrombin) gene is the second most common cause of inherited thrombophilia. The incidence of this mutation in the [place] Caucasian population is about 2% and in the [place] American population it is approximately 0.5%. This mutation is rare in the [place] and Native American population. Being heterozygous for a prothrombin mutation increases the risk for developing venous thrombosis about 2  to 3 times above the general population risk. Being homozygous for the prothrombin gene mutation increases the relative risk for venous thrombosis further, although it is not yet known how much further the risk is increased. In women heterozygous for the prothrombin gene mutation, the use of estrogen containing oral contraceptives increases the relative risk of venous thrombosis about 16 times and the risk of developing cerebral thrombosis is also significantly increased. In pregnancy the pr                          othrombin gene mutation increases risk for venous thrombosis and may increase risk for stillbirth, placental abruption, pre-eclampsia and fetal growth restriction. If the patient possesses two or more congenital or acquired thrombophilic risk factors, the  risk for thrombosis may rise to more than the sum of the risk ratios for the individual mutations. This assay detects only the prothrombin G20210A mutation and does not measure genetic abnormalities elsewhere in the genome. Other thrombotic risk factors may be pursued through systematic clinical laboratory analysis. These factors include the R506Q (Leiden) mutation in the Factor V gene, plasma homocysteine levels, as well as testing for deficiencies of antithrombin III, protein C and protein S. Genetic Counselors are available for health care providers to discuss results at 1-800-345-GENE [phone]). Methodology: DNA analysis of the Factor II gene was performed by PCR amplification followed by restriction analysis. The di                          agnostic sensitivity is >99% for both. All the tests must be combined with clinical information for the most accurate interpretation. Molecular-based testing is highly accurate, but as in any laboratory test, diagnostic errors may occur. This test was developed and its performance characteristics determined by [facility]. It has not been cleared or approved by the Food and Drug Administration. Poort SR, et al. Blood. 1996; 32:9518-8416. Varga EA. Circulation. 2004; 606:T01-S01. Mervin Hack, et [place]; 19:700-703. Allison Quarry, PhD, [facility] Ruben Reason, PhD, [facility] Annetta Maw, M.S., PhD, [facility] Alfredo Bach, PhD, [facility] Norva Riffle, PhD, [facility] Earlean Polka, PhD, [facility] Performed At: [facility] [address] [place], [place] [id number] Nechama Guard MD [id number] Performed at [facility], [address] [place], [place] [address]   . Recommendations-F5LEID: 03/01/2018 Comment   Final   Comment: (NOTE) Result:  Negative (no mutation found) Factor V Leiden is a specific mutation (R506Q) in the factor V gene that is associated with an increased risk of venous thrombosis.  Factor V Leiden is more resistant to inactivation by activated protein C.  As a result, factor V persists in the circulation leading to a mild hyper- coagulable state.  The Leiden mutation accounts for 90% - 95% of APC resistance.  Factor V Leiden has been reported in patients with deep vein thrombosis, pulmonary embolus, central retinal vein occlusion, cerebral sinus thrombosis and hepatic vein thrombosis. Other risk factors to be considered in the workup for venous thrombosis include the G20210A mutation in the factor II (prothrombin) gene, protein S and C deficiency, and antithrombin deficiencies. Anticardiolipin antibody and lupus anticoagulant analysis may be appropriate for certain patients, as well as homocysteine levels. Contact your local [facility] for information on how to order additi  onal testing if desired. **Genetic counselors are available for health care providers to**  discuss results at 1-800-345-GENE [phone]). Methodology: DNA analysis of the Factor V gene was performed by allele-specific PCR. The diagnostic sensitivity and specificity is >99% for both. Molecular-based testing is highly accurate, but as in any laboratory test, diagnostic errors may occur. All test results must be combined with clinical information for the most accurate interpretation. This test was developed and its performance characteristics determined by [facility]. It has not been cleared or approved by the Food and Drug Administration. References: Voelkerding K (1996).  Clin Lab Med [phone]. Allison Quarry, PhD, [facility] Ruben Reason, PhD, [facility] Annetta Maw, M.S., PhD, [facility] Alfredo Bach, PhD, [facility] Norva Riffle, PhD, [facility] Earlean Polka PhD, [facility] Performed At: [facility] [address] [place], [place] [id number] Nechama Guard MD [id number]                          4 Performed at [facility], [address] [place],  [place] [address]   . Beta-2 Glyco I IgG 03/01/2018 <9  0 - 20 GPI IgG units Final   Comment: (NOTE) The reference interval reflects a 3SD or 99th percentile interval, which is thought to represent a potentially clinically significant result in accordance with the International Consensus Statement on the classification criteria for definitive antiphospholipid syndrome (APS). J Thromb Haem 2006;4:295-306.   . Beta-2-Glycoprotein I IgM 03/01/2018 <9  0 - 32 GPI IgM units Final   Comment: (NOTE) The reference interval reflects a 3SD or 99th percentile interval, which is thought to represent a potentially clinically significant result in accordance with the International Consensus Statement on the classification criteria for definitive antiphospholipid syndrome (APS). J Thromb Haem 2006;4:295-306. Performed At: [facility] [place], [place] [id number] Rush Farmer MD VZ:[id number]   . Beta-2-Glycoprotein I IgA 03/01/2018 <9  0 - 25 GPI IgA units Final   Comment: (NOTE) The reference interval reflects a 3SD or 99th percentile interval, which is thought to represent a potentially clinically significant result in accordance with the International Consensus Statement on the classification criteria for definitive antiphospholipid syndrome (APS). J Thromb Haem 2006;4:295-306. Performed at [facility], [address] [place], [place] [address]   . Anticardiolipin IgG 03/01/2018 <9  0 - 14 GPL U/mL Final   Comment: (NOTE)                          Negative:              <15                          Indeterminate:     15 - 20                          Low-Med Positive: >20 - 80                          High Positive:         >80   . Anticardiolipin IgM 03/01/2018 22* 0 - 12 MPL U/mL Final   Comment: (NOTE)                          Negative:              <  13                          Indeterminate:     13 - 20                          Low-Med Positive: >20 - 80                           High Positive:         >80   . Anticardiolipin IgA 03/01/2018 <9  0 - 11 APL U/mL Final   Comment: (NOTE)                          Negative:              <12                          Indeterminate:     12 - 20                          Low-Med Positive: >20 - 80                          High Positive:         >80 Performed At: [facility] [address] [place], [place] [id number] Rush Farmer MD [id number] Performed at [facility], [address] [place], [place] [address]   . PTT Lupus Anticoagulant 03/01/2018 45.2  0.0 - 51.9 sec Final  . DRVVT 03/01/2018 36.2  0.0 - 47.0 sec Final  . Lupus Anticoag Interp 03/01/2018 Comment:   Corrected   Comment: (NOTE) No lupus anticoagulant was detected. Performed At: [facility] [place], [place] [id number] Rush Farmer MD [id number] Performed at [facility], [address] [place], [place] [address]   . Sodium 03/01/2018 137  135 - 145 mmol/L Final  . Potassium 03/01/2018 3.8  3.5 - 5.1 mmol/L Final  . Chloride 03/01/2018 106  101 - 111 mmol/L Final  . CO2 03/01/2018 24  22 - 32 mmol/L Final  . Glucose, Bld 03/01/2018 86  65 - 99 mg/dL Final  . BUN 03/01/2018 20  6 - 20 mg/dL Final  . Creatinine, Ser 03/01/2018 0.66  0.44 - 1.00 mg/dL Final  . Calcium 03/01/2018 8.9  8.9 - 10.3 mg/dL Final  . Total Protein 03/01/2018 7.5  6.5 - 8.1 g/dL Final  . Albumin 03/01/2018 4.1  3.5 - 5.0 g/dL Final  . AST 03/01/2018 17  15 - 41 U/L Final  . ALT 03/01/2018 10* 14 - 54 U/L Final  . Alkaline Phosphatase 03/01/2018 61  38 - 126 U/L Final  . Total Bilirubin 03/01/2018 1.1  0.3 - 1.2 mg/dL Final  . GFR calc non Af Amer 03/01/2018 >60  >60 mL/min Final  . GFR calc Af Amer 03/01/2018 >60  >60 mL/min Final   Comment: (NOTE) The eGFR has been calculated using the CKD EPI equation. This calculation has not been validated in all clinical situations. eGFR's persistently <60  mL/min signify possible Chronic Kidney Disease.   Georgiann Hahn gap 03/01/2018 7  5 - 15 Final   Performed at [facility], [address] [place], [place] [address]  .  WBC 03/01/2018 6.3  3.6 - 11.0 K/uL Final  . RBC 03/01/2018 4.45  3.80 - 5.20 MIL/uL Final  . Hemoglobin 03/01/2018 13.4  12.0 - 16.0 g/dL Final  . HCT 03/01/2018 40.1  35.0 - 47.0 % Final  . MCV 03/01/2018 90.1  80.0 - 100.0 fL Final  . MCH 03/01/2018 30.2  26.0 - 34.0 pg Final  . MCHC 03/01/2018 33.5  32.0 - 36.0 g/dL Final  . RDW 03/01/2018 14.2  11.5 - 14.5 % Final  . Platelets 03/01/2018 259  150 - 440 K/uL Final  . Neutrophils Relative % 03/01/2018 65  % Final  . Neutro Abs 03/01/2018 4.1  1.4 - 6.5 K/uL Final  . Lymphocytes Relative 03/01/2018 23  % Final  . Lymphs Abs 03/01/2018 1.5  1.0 - 3.6 K/uL Final  . Monocytes Relative 03/01/2018 9  % Final  . Monocytes Absolute 03/01/2018 0.6  0.2 - 0.9 K/uL Final  . Eosinophils Relative 03/01/2018 2  % Final  . Eosinophils Absolute 03/01/2018 0.1  0 - 0.7 K/uL Final  . Basophils Relative 03/01/2018 1  % Final  . Basophils Absolute 03/01/2018 0.0  0 - 0.1 K/uL Final   Performed at [facility], [address] [place], [place] [address]    Assessment:  WALLY BEHAN is a 47 y.o. female with a history of antiphospholipid antibody syndrome presenting with miscarriage x 3.  She had 2 successful pregnancies on aspirin and Lovenox.  Her first pregnancy was complicated by a sub-chorionic hematoma.  She has never had a thrombosis.  She may have an underlying connective tissue disorder.  She has Raynaud's, mild sclerodactyly, and nailfold telangectasias.  She describes polyarthralgias, malar rash, and photosensitivity.  She is followed by Dr.Kernodle.  Sed rate was 31.  CRP was 10.8.  CK was 133.  Anti-scleroderma-70 was negative. There was no monoclonal spike.  IgG was 1204, IgA 265, and IgM 166.  Echo on 03/22/2018 revealed an EF of 60-65%..  There was mild LVH.   There was no cardiac source of emboli.  Work-up on 03/01/2018 revealed a hematocrit of 40.1, hemoglobin 13.4, MCV 90.1, platelets 259,000, WBC 6300 with an ANC of 4100.  CMP was normal.  Lupus anticoagulant panel was normal.  Anti-cardiolipin IgM was 22 (low-med positive).  Beta 2 glycoproiein antibodies were negative.  ANA was negative.  C3 and C4 were normal.  Factor V Leiden and prothrombin gene mutation were negative.  Symptomatically,  .she feels well.  Exam is unremarkable.  Plan: 1. Labs today: CBC with diff, CMP, anti-cardiolipin Ab 2. Discuss rheumatology follow up.  3.  Discuss work-up.  No current evidence of a lupus anticoagulant/anti-phospholipid antibody syndrome.  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 2.  Follow-up with Dr. Jefm Bryant as scheduled. 3.  RTC in 3 months for MD assessment and labs (CBC with diff, CMP, anti-cardiolipin antibodies).   Honor Loh, NP  06/24/2018, 5:19 PM

## 2018-06-25 ENCOUNTER — Inpatient Hospital Stay: Payer: 59 | Admitting: Urgent Care

## 2018-06-25 ENCOUNTER — Inpatient Hospital Stay: Payer: 59 | Attending: Hematology and Oncology

## 2018-08-26 ENCOUNTER — Ambulatory Visit (INDEPENDENT_AMBULATORY_CARE_PROVIDER_SITE_OTHER): Payer: Self-pay | Admitting: Physician Assistant

## 2018-08-26 VITALS — BP 120/82 | HR 94 | Temp 99.0°F | Resp 19 | Wt 244.0 lb

## 2018-08-26 DIAGNOSIS — J029 Acute pharyngitis, unspecified: Secondary | ICD-10-CM

## 2018-08-26 LAB — POCT RAPID STREP A (OFFICE): RAPID STREP A SCREEN: NEGATIVE

## 2018-08-26 MED ORDER — PREDNISONE 20 MG PO TABS: 40.0000 mg | ORAL_TABLET | Freq: Every day | ORAL | 0 refills | Status: AC

## 2018-08-26 MED ORDER — PREDNISONE 20 MG PO TABS: 40.0000 mg | ORAL_TABLET | Freq: Every day | ORAL | 0 refills | Status: DC

## 2018-08-26 NOTE — Patient Instructions (Signed)

## 2018-08-26 NOTE — Progress Notes (Addendum)
   Subjective:    Patient ID: Molly Sanchez, female    DOB: 1971/12/06, 47 y.o.   MRN: [id number]  47 year old female [facility] to [facility] with three day history of sore throat. Began as mild. Gradually worsened. Currently severe. Aggravated with swallowing. Left side worse than right. Associated tender left sided anterior cervical and posterior cervical lymphadenopathy. Associated headache and bodyaches and fatigue. Has taken over the counter Advil and gargled with warm salt water with minimal relief. 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 No previous history of strep throat. Denies associated URI symptoms. Denies associated seasonal allergies. No previous history of mononucleosis.  Sore Throat   Associated symptoms include headaches and trouble swallowing. Pertinent negatives include no congestion, ear discharge, ear pain or neck pain.      Review of Systems  Constitutional: Positive for fatigue and fever.  HENT: Positive for sore throat and trouble swallowing. Negative for congestion, ear discharge, ear pain, mouth sores, postnasal drip, sinus pressure and sinus pain.   Eyes: Negative.   Respiratory: Negative.   Cardiovascular: Negative.   Gastrointestinal: Negative.   Musculoskeletal: Positive for arthralgias. Negative for neck pain and neck stiffness.  Neurological: Positive for headaches. Negative for dizziness.       Objective:   Physical Exam  Constitutional: She is oriented to person, place, and time. She appears well-developed.  HENT:  Right Ear: External ear normal.  Left Ear: External ear normal.  Nose: Nose normal.  Mouth/Throat: Mucous membranes are normal.  Tonsils reveal minimal enlargement, equal bilaterally. Moderate erythema. Scant post-nasal drip. No exudate. Uvula midline.  Eyes: Pupils are equal, round, and reactive to light.  Neck: Normal range of motion.  Cardiovascular: Normal  rate and regular rhythm.  Pulmonary/Chest: Effort normal and breath sounds normal.  Abdominal: Soft. Bowel sounds are normal. There is no tenderness.  Lymphadenopathy:  Mild bilateral anterior cervical lymphadenopathy. Tender left anterior and posterior cervical lymphadenopathy.  Neurological: She is alert and oriented to person, place, and time.  Skin: Skin is warm and dry.          Assessment & Plan:   Exam findings, diagnosis etiology and medication use and indications reviewed with patient. Follow- Up and discharge instructions provided. No emergent/urgent issues found on exam.  Patient verbalized understanding of information provided and agrees with plan of care (POC), all questions answered. The patient is advised to call or return to clinic if condition does not see an improvement in symptoms, or to seek the care of the closest emergency department if condition worsens with the above plan.   1. Sore throat  - POCT rapid strep A  2. Acute pharyngitis, unspecified etiology  - predniSONE (DELTASONE) 20 MG tablet; Take 2 tablets (40 mg total) by mouth daily with breakfast for 3 days.  Dispense: 6 tablet; Refill: 0  Take Tylenol or ibuprofen for pain/fever. May gargle with salt water for symptom relief. May use over the counter throat lozenges for symptom relief. Advised patient follow-up in 2-3 days if not improving.  08/26/18 @3 :35pm Note amended to change provider signature. SFS PA-C.

## 2018-08-28 ENCOUNTER — Telehealth: Payer: Self-pay

## 2018-08-28 ENCOUNTER — Telehealth: Payer: Self-pay | Admitting: Nurse Practitioner

## 2018-08-28 NOTE — Telephone Encounter (Signed)
I left a message to the patient asking to call us back. 

## 2018-08-28 NOTE — Telephone Encounter (Signed)
Patient return phone call.  Patient was seen 2 days ago and states she is feeling much better.  Thanked patient for her call and ask her to return as needed.

## 2018-08-30 ENCOUNTER — Encounter: Payer: Self-pay | Admitting: Family

## 2018-08-30 MED FILL — NALTREXONE 50 MG TABLET: 50 | 30 days supply | Qty: 30 | Fill #0

## 2018-09-05 MED FILL — DEXTROAMP-AMPHETAMIN 20 MG: 20 | 90 days supply | Qty: 360 | Fill #0

## 2018-09-30 MED FILL — NALTREXONE 50 MG TABLET: 50 | 90 days supply | Qty: 180 | Fill #0

## 2018-11-09 DIAGNOSIS — H5213 Myopia, bilateral: Secondary | ICD-10-CM | POA: Diagnosis not present

## 2018-11-25 DIAGNOSIS — N951 Menopausal and female climacteric states: Secondary | ICD-10-CM | POA: Diagnosis not present

## 2018-11-25 DIAGNOSIS — N939 Abnormal uterine and vaginal bleeding, unspecified: Secondary | ICD-10-CM | POA: Diagnosis not present

## 2018-12-27 MED FILL — DEXTROAMP-AMPHETAMIN 20 MG: 20 | 90 days supply | Qty: 360 | Fill #0

## 2019-04-12 MED FILL — NALTREXONE 50 MG TABLET: 50 | 90 days supply | Qty: 180 | Fill #0

## 2019-04-12 MED FILL — AMPHETAMINE-DEXTROAMPHETAMI: 20 | 90 days supply | Qty: 360 | Fill #0

## 2019-04-16 MED FILL — TRINTELLIX 10 MG TABLET: 10 | 30 days supply | Qty: 30 | Fill #0

## 2019-05-20 MED FILL — VIIBRYD 40 MG TABLET: 40 | 30 days supply | Qty: 30 | Fill #0

## 2019-06-11 ENCOUNTER — Encounter: Payer: Self-pay | Admitting: Family

## 2019-06-27 ENCOUNTER — Other Ambulatory Visit: Payer: Self-pay

## 2019-06-30 ENCOUNTER — Ambulatory Visit (INDEPENDENT_AMBULATORY_CARE_PROVIDER_SITE_OTHER): Payer: 59 | Admitting: Family

## 2019-06-30 ENCOUNTER — Other Ambulatory Visit: Payer: Self-pay

## 2019-06-30 ENCOUNTER — Encounter: Payer: Self-pay | Admitting: Family

## 2019-06-30 VITALS — BP 98/60 | HR 76 | Temp 97.9°F | Ht 66.25 in | Wt 245.1 lb

## 2019-06-30 DIAGNOSIS — D6861 Antiphospholipid syndrome: Secondary | ICD-10-CM | POA: Diagnosis not present

## 2019-06-30 DIAGNOSIS — E669 Obesity, unspecified: Secondary | ICD-10-CM

## 2019-06-30 DIAGNOSIS — Z0000 Encounter for general adult medical examination without abnormal findings: Secondary | ICD-10-CM | POA: Diagnosis not present

## 2019-06-30 DIAGNOSIS — Z6837 Body mass index (BMI) 37.0-37.9, adult: Secondary | ICD-10-CM | POA: Diagnosis not present

## 2019-06-30 LAB — LIPID PANEL
Cholesterol: 194 mg/dL (ref 0–200)
HDL: 64.3 mg/dL (ref >39.00)
LDL Cholesterol: 118 mg/dL — ABNORMAL HIGH (ref 0–99)
NonHDL: 129.2
Total CHOL/HDL Ratio: 3
Triglycerides: 58 mg/dL (ref 0.0–149.0)
VLDL: 11.6 mg/dL (ref 0.0–40.0)

## 2019-06-30 LAB — COMPREHENSIVE METABOLIC PANEL
ALT: 10 U/L (ref 0–35)
AST: 15 U/L (ref 0–37)
Albumin: 4.1 g/dL (ref 3.5–5.2)
Alkaline Phosphatase: 58 U/L (ref 39–117)
BUN: 18 mg/dL (ref 6–23)
CO2: 26 mEq/L (ref 19–32)
Calcium: 8.9 mg/dL (ref 8.4–10.5)
Chloride: 105 mEq/L (ref 96–112)
Creatinine, Ser: 0.78 mg/dL (ref 0.40–1.20)
GFR: 95.61 mL/min (ref >60.00)
Glucose, Bld: 88 mg/dL (ref 70–99)
Potassium: 4.3 mEq/L (ref 3.5–5.1)
Sodium: 138 mEq/L (ref 135–145)
Total Bilirubin: 0.5 mg/dL (ref 0.2–1.2)
Total Protein: 6.7 g/dL (ref 6.0–8.3)

## 2019-06-30 LAB — CBC WITH DIFFERENTIAL/PLATELET
Basophils Absolute: 0 10*3/uL (ref 0.0–0.1)
Basophils Relative: 0.8 % (ref 0.0–3.0)
Eosinophils Absolute: 0.1 10*3/uL (ref 0.0–0.7)
Eosinophils Relative: 2 % (ref 0.0–5.0)
HCT: 39.3 % (ref 36.0–46.0)
Hemoglobin: 13.3 g/dL (ref 12.0–15.0)
Lymphocytes Relative: 26.3 % (ref 12.0–46.0)
Lymphs Abs: 1.4 10*3/uL (ref 0.7–4.0)
MCHC: 33.9 g/dL (ref 30.0–36.0)
MCV: 95.8 fl (ref 78.0–100.0)
Monocytes Absolute: 0.6 10*3/uL (ref 0.1–1.0)
Monocytes Relative: 10.3 % (ref 3.0–12.0)
Neutro Abs: 3.3 10*3/uL (ref 1.4–7.7)
Neutrophils Relative %: 60.6 % (ref 43.0–77.0)
Platelets: 239 10*3/uL (ref 150.0–400.0)
RBC: 4.1 Mil/uL (ref 3.87–5.11)
RDW: 13.6 % (ref 11.5–15.5)
WBC: 5.5 10*3/uL (ref 4.0–10.5)

## 2019-06-30 LAB — VITAMIN D 25 HYDROXY (VIT D DEFICIENCY, FRACTURES): VITD: 13.75 ng/mL — ABNORMAL LOW (ref 30.00–100.00)

## 2019-06-30 LAB — TSH: TSH: 2.51 u[IU]/mL (ref 0.35–4.50)

## 2019-06-30 LAB — HEMOGLOBIN A1C: Hgb A1c MFr Bld: 5.3 % (ref 4.6–6.5)

## 2019-06-30 MED ORDER — SAXENDA 18 MG/3ML ~~LOC~~ SOPN: 0.6000 mg | PEN_INJECTOR | Freq: Every day | SUBCUTANEOUS | 3 refills | Status: DC

## 2019-06-30 NOTE — Assessment & Plan Note (Signed)
Clinical breast exam performed.  Overdue for mammogram, patient has already scheduled . deferred pelvic exam in the absence of complaints, Pap smear is up-to-date.  She also follows with GYN.

## 2019-06-30 NOTE — Assessment & Plan Note (Signed)
Discussed lifestyle modifications not to place patient on recent couple pound weight loss.  She will think about seeing Redgie Grayer and also nutrition consult.  Discussed diet, advised weight watchers.  Discussed black box warning of Saxenda and patient verbalized understanding.  Trial of Saxenda with close follow-up.

## 2019-06-30 NOTE — Progress Notes (Signed)
Subjective:    Patient ID: Molly Sanchez, female    DOB: 01-Aug-1971, 48 y.o.   MRN: [id number]  CC: Molly Sanchez is a 48 y.o. female who presents today for physical exam.    HPI: Overall feels well today.  Over the past several months and even years, she remains frustrated about her weight.  She feels every year is gaining weight.  Over the past week, she has focused on eating healthier and already lost a couple pounds. She snacks more when stressed.   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  Overall she feels that she is coping well.  She continues to follow psychiatry.  No history of thyroid medullary cancer or thyroid cancer personally or in her family.  No hoarseness or trouble swallowing   Depression- following with Dr Harlene Ramus ADHD- following with Dr Harlene Ramus Antiphospholipid syndrome - Follow up with Dr Mike Gip this year; is not on ASA , though had been with pregnancy.   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 tried plaquenil, however didn't like medication.  Colorectal Cancer Screening: UTD; repeat in 5 years.  Breast Cancer Screening: Mammogram overdue; last 03/2016; scheduled for august 2020.  Cervical Cancer Screening: UTD; Follows with OB  Lung Cancer Screening: Doesn't have 30 year pack year history and age > 74 years       Tetanus - utd        Pneumococcal - Not a candidate for.   Labs: Screening labs today. Exercise: Gets regular exercise.  Alcohol use: none Smoking/tobacco use: Nonsmoker.  Wears seat belt: Yes.  HISTORY:  Past Medical History:  Diagnosis Date  . ADD (attention deficit disorder)   . ANA positive   . Antiphospholipid antibody syndrome ([place]) 11/15/2012  . Antiphospholipid antibody syndrome complicating pregnancy ([place])   . Back pain   . Breast fibroadenoma    history of  . Clotting disorder ([place])    with pregancy  . Depression   . Dysplasia of cervix, low grade (CIN 1)  [id number]  . Fatigue   . Fibromyalgia   . GERD (gastroesophageal reflux disease)   . H/O varicella   . Hx of blood clots    During pregnancy  . Joint pain   . Raynaud's disease   . Rosacea   . Vitamin D deficiency     Past Surgical History:  Procedure Laterality Date  . CESAREAN SECTION    . GYNECOLOGIC CRYOSURGERY     of cervix  . right breast fibroadenoma removed     Family History  Problem Relation Age of Onset  . Hypertension Mother   . Stroke Mother   . Heart attack Mother   . Diabetes Mother   . Hyperlipidemia Mother   . Heart disease Mother   . Depression Mother   . Anxiety disorder Mother   . Schizophrenia Mother   . Obesity Mother   . Mental illness Brother        schizophrenia  . Colon cancer Neg Hx   . Esophageal cancer Neg Hx   . Rectal cancer Neg Hx   . Stomach cancer Neg Hx   . Pancreatic cancer Neg Hx   . Thyroid cancer Neg Hx       ALLERGIES: Patient has no known allergies.  Current Outpatient Medications on File Prior to Visit  Medication Sig Dispense Refill  . amphetamine-dextroamphetamine (ADDERALL) 20 MG tablet   0  . desvenlafaxine (PRISTIQ) 50 MG 24 hr tablet  Take 1 tablet by mouth daily.     Current Facility-Administered Medications on File Prior to Visit  Medication Dose Route Frequency Provider Last Rate Last Dose  . 0.9 %  sodium chloride infusion  500 mL Intravenous Continuous Pyrtle, Lajuan Lines, MD        Social History   Tobacco Use  . Smoking status: Never Smoker  . Smokeless tobacco: Never Used  Substance Use Topics  . Alcohol use: No    Alcohol/week: 1.0 standard drinks    Types: 1 Glasses of wine per week    Frequency: Never    Comment: none   . Drug use: No    Review of Systems  Constitutional: Negative for chills, fever and unexpected weight change.  HENT: Negative for congestion, sore throat and trouble swallowing.   Respiratory: Negative for cough.   Cardiovascular: Negative for chest pain, palpitations and leg  swelling.  Gastrointestinal: Negative for nausea and vomiting.  Genitourinary: Negative for pelvic pain.  Musculoskeletal: Negative for arthralgias and myalgias.  Skin: Negative for rash.  Neurological: Negative for headaches.  Hematological: Negative for adenopathy.  Psychiatric/Behavioral: Negative for confusion.      Objective:    BP 98/60   Pulse 76   Temp 97.9 F (36.6 C)   Ht 5' 6.25" (1.683 m)   Wt 245 lb 1.9 oz (111.2 kg)   SpO2 99%   BMI 39.27 kg/m   BP Readings from Last 3 Encounters:  06/30/19 98/60  08/26/18 120/82  03/22/18 130/78   Wt Readings from Last 3 Encounters:  06/30/19 245 lb 1.9 oz (111.2 kg)  08/26/18 244 lb (110.7 kg)  03/22/18 230 lb (104.3 kg)    Physical Exam Vitals signs reviewed.  Constitutional:      Appearance: She is well-developed.  Eyes:     Conjunctiva/sclera: Conjunctivae normal.  Neck:     Thyroid: No thyroid mass or thyromegaly.  Cardiovascular:     Rate and Rhythm: Normal rate and regular rhythm.     Pulses: Normal pulses.     Heart sounds: Normal heart sounds.  Pulmonary:     Effort: Pulmonary effort is normal.     Breath sounds: Normal breath sounds. No wheezing, rhonchi or rales.  Chest:     Breasts: Breasts are symmetrical.        Right: No inverted nipple, mass, nipple discharge, skin change or tenderness.        Left: No inverted nipple, mass, nipple discharge, skin change or tenderness.  Lymphadenopathy:     Head:     Right side of head: No submental, submandibular, tonsillar, preauricular, posterior auricular or occipital adenopathy.     Left side of head: No submental, submandibular, tonsillar, preauricular, posterior auricular or occipital adenopathy.     Cervical: No cervical adenopathy.     Right cervical: No superficial, deep or posterior cervical adenopathy.    Left cervical: No superficial, deep or posterior cervical adenopathy.  Skin:    General: Skin is warm and dry.  Neurological:     Mental  Status: She is alert.  Psychiatric:        Speech: Speech normal.        Behavior: Behavior normal.        Thought Content: Thought content normal.        Assessment & Plan:   Problem List Items Addressed This Visit      Hematopoietic and Hemostatic   Antiphospholipid antibody syndrome [facility])    Has appointment scheduled  with Dr. Mike Gip, hematology.  Specifically advised her discussed her to discuss use of aspirin with her history.  Patient will let me know.  We also discussed modifiable lifestyle changes  to reduce the size of thrombosis including as patient is a non-smoker, very well controlled blood pressure, and she is not on OCP. Will follow        Other   Routine general medical examination at a health care facility - Primary    Clinical breast exam performed.  Overdue for mammogram, patient has already scheduled . deferred pelvic exam in the absence of complaints, Pap smear is up-to-date.  She also follows with GYN.      Relevant Orders   TSH   CBC with Differential/Platelet   Comprehensive metabolic panel   Hemoglobin A1c   Lipid panel   VITAMIN D 25 Hydroxy (Vit-D Deficiency, Fractures)   Class 2 obesity without serious comorbidity with body mass index (BMI) of 37.0 to 37.9 in adult    Discussed lifestyle modifications not to place patient on recent couple pound weight loss.  She will think about seeing Redgie Grayer and also nutrition consult.  Discussed diet, advised weight watchers.  Discussed black box warning of Saxenda and patient verbalized understanding.  Trial of Saxenda with close follow-up.       Relevant Medications   Liraglutide -Weight Management (SAXENDA) 18 MG/3ML SOPN    Other Visit Diagnoses    Obesity (BMI 35.0-39.9 without comorbidity)       Relevant Medications   Liraglutide -Weight Management (SAXENDA) 18 MG/3ML SOPN       I am having Kynzi B. Hannig start on Saxenda. I am also having her maintain her amphetamine-dextroamphetamine and  desvenlafaxine. We will continue to administer sodium chloride.   Meds ordered this encounter  Medications  . Liraglutide -Weight Management (SAXENDA) 18 MG/3ML SOPN    Sig: Inject 0.6 mg into the skin daily.    Dispense:  1 pen    Refill:  3    Order Specific Question:   Supervising Provider    Answer:   Crecencio Mc [[id number]]    Return precautions given.   Risks, benefits, and alternatives of the medications and treatment plan prescribed today were discussed, and patient expressed understanding.   Education regarding symptom management and diagnosis given to patient on AVS.   Continue to follow with Burnard Hawthorne, FNP for routine health maintenance.   Redgie Grayer and I agreed with plan.   Mable Paris, FNP

## 2019-06-30 NOTE — Patient Instructions (Signed)
Ensure you have your mammogram in August.   Trial of saxenda.   As discussed, you may not take saxenda with personal or family history of thyroid cancer.   We will have close follow-up to see how you are feeling on this medication.  Please ensure you follow-up with rheumatology, Dr. Jefm Bryant and also hematology, Dr. Mike Gip.  In particular I want to ensure that Dr. Tanja Port does not feel you need to be on aspirin 81 mg daily due to your history.   Stay safe!   Health Maintenance, Female Adopting a healthy lifestyle and getting preventive care are important in promoting health and wellness. Ask your health care provider about:  The right schedule for you to have regular tests and exams.  Things you can do on your own to prevent diseases and keep yourself healthy. What should I know about diet, weight, and exercise? Eat a healthy diet   Eat a diet that includes plenty of vegetables, fruits, low-fat dairy products, and lean protein.  Do not eat a lot of foods that are high in solid fats, added sugars, or sodium. Maintain a healthy weight Body mass index (BMI) is used to identify weight problems. It estimates body fat based on height and weight. Your health care provider can help determine your BMI and help you achieve or maintain a healthy weight. Get regular exercise Get regular exercise. This is one of the most important things you can do for your health. Most adults should:  Exercise for at least 150 minutes each week. The exercise should increase your heart rate and make you sweat (moderate-intensity exercise).  Do strengthening exercises at least twice a week. This is in addition to the moderate-intensity exercise.  Spend less time sitting. Even light physical activity can be beneficial. Watch cholesterol and blood lipids Have your blood tested for lipids and cholesterol at 47 years of age, then have this test every 5 years. Have your cholesterol levels checked more often  if:  Your lipid or cholesterol levels are high.  You are older than 47 years of age.  You are at high risk for heart disease. What should I know about cancer screening? Depending on your health history and family history, you may need to have cancer screening at various ages. This may include screening for:  Breast cancer.  Cervical cancer.  Colorectal cancer.  Skin cancer.  Lung cancer. What should I know about heart disease, diabetes, and high blood pressure? Blood pressure and heart disease  High blood pressure causes heart disease and increases the risk of stroke. This is more likely to develop in people who have high blood pressure readings, are of African descent, or are overweight.  Have your blood pressure checked: ? Every 3-5 years if you are 89-39 years of age. ? Every year if you are 26 years old or older. Diabetes Have regular diabetes screenings. This checks your fasting blood sugar level. Have the screening done:  Once every three years after age 96 if you are at a normal weight and have a low risk for diabetes.  More often and at a younger age if you are overweight or have a high risk for diabetes. What should I know about preventing infection? Hepatitis B If you have a higher risk for hepatitis B, you should be screened for this virus. Talk with your health care provider to find out if you are at risk for hepatitis B infection. Hepatitis C Testing is recommended for:  Everyone born from [birth year]  through 1965.  Anyone with known risk factors for hepatitis C. Sexually transmitted infections (STIs)  Get screened for STIs, including gonorrhea and chlamydia, if: ? You are sexually active and are younger than 47 years of age. ? You are older than 47 years of age and your health care provider tells you that you are at risk for this type of infection. ? Your sexual activity has changed since you were last screened, and you are at increased risk for chlamydia or  gonorrhea. Ask your health care provider if you are at risk.  Ask your health care provider about whether you are at high risk for HIV. Your health care provider may recommend a prescription medicine to help prevent HIV infection. If you choose to take medicine to prevent HIV, you should first get tested for HIV. You should then be tested every 3 months for as long as you are taking the medicine. Pregnancy  If you are about to stop having your period (premenopausal) and you may become pregnant, seek counseling before you get pregnant.  Take 400 to 800 micrograms (mcg) of folic acid every day if you become pregnant.  Ask for birth control (contraception) if you want to prevent pregnancy. Osteoporosis and menopause Osteoporosis is a disease in which the bones lose minerals and strength with aging. This can result in bone fractures. If you are 64 years old or older, or if you are at risk for osteoporosis and fractures, ask your health care provider if you should:  Be screened for bone loss.  Take a calcium or vitamin D supplement to lower your risk of fractures.  Be given hormone replacement therapy (HRT) to treat symptoms of menopause. Follow these instructions at home: Lifestyle  Do not use any products that contain nicotine or tobacco, such as cigarettes, e-cigarettes, and chewing tobacco. If you need help quitting, ask your health care provider.  Do not use street drugs.  Do not share needles.  Ask your health care provider for help if you need support or information about quitting drugs. Alcohol use  Do not drink alcohol if: ? Your health care provider tells you not to drink. ? You are pregnant, may be pregnant, or are planning to become pregnant.  If you drink alcohol: ? Limit how much you use to 0-1 drink a day. ? Limit intake if you are breastfeeding.  Be aware of how much alcohol is in your drink. In the [place] one drink equals one 12 oz bottle of beer (355 mL), one 5 oz  glass of wine (148 mL), or one 1 oz glass of hard liquor (44 mL). General instructions  Schedule regular health, dental, and eye exams.  Stay current with your vaccines.  Tell your health care provider if: ? You often feel depressed. ? You have ever been abused or do not feel safe at home. Summary  Adopting a healthy lifestyle and getting preventive care are important in promoting health and wellness.  Follow your health care provider's instructions about healthy diet, exercising, and getting tested or screened for diseases.  Follow your health care provider's instructions on monitoring your cholesterol and blood pressure. This information is not intended to replace advice given to you by your health care provider. Make sure you discuss any questions you have with your health care provider. Document Released: 06/12/2011 Document Revised: 11/20/2018 Document Reviewed: 11/20/2018 Elsevier Patient Education  2020 [employer].

## 2019-06-30 NOTE — Assessment & Plan Note (Addendum)
Has appointment scheduled with Dr. Mike Gip, hematology.  Specifically advised her discussed her to discuss use of aspirin with her history.  Patient will let me know.  We also discussed modifiable lifestyle changes  to reduce the size of thrombosis including as patient is a non-smoker, very well controlled blood pressure, and she is not on OCP. Will follow

## 2019-07-04 ENCOUNTER — Other Ambulatory Visit: Payer: Self-pay

## 2019-07-04 ENCOUNTER — Encounter: Payer: Self-pay | Admitting: Family

## 2019-07-04 ENCOUNTER — Telehealth: Payer: Self-pay

## 2019-07-04 DIAGNOSIS — E669 Obesity, unspecified: Secondary | ICD-10-CM

## 2019-07-04 MED ORDER — SAXENDA 18 MG/3ML ~~LOC~~ SOPN: 0.6000 mg | PEN_INJECTOR | Freq: Every day | SUBCUTANEOUS | 3 refills | Status: DC

## 2019-07-04 NOTE — Telephone Encounter (Signed)
I received conformation for [employer] that PA for Saxenda was approved 07/03/2019-11/02/2019.

## 2019-07-18 ENCOUNTER — Other Ambulatory Visit: Payer: Self-pay | Admitting: Family

## 2019-07-18 ENCOUNTER — Encounter: Payer: Self-pay | Admitting: Family

## 2019-07-18 DIAGNOSIS — Z1231 Encounter for screening mammogram for malignant neoplasm of breast: Secondary | ICD-10-CM

## 2019-07-23 ENCOUNTER — Other Ambulatory Visit: Payer: Self-pay | Admitting: Family

## 2019-07-23 DIAGNOSIS — R635 Abnormal weight gain: Secondary | ICD-10-CM

## 2019-08-01 ENCOUNTER — Telehealth: Payer: Self-pay | Admitting: *Deleted

## 2019-08-01 NOTE — Telephone Encounter (Signed)
She may cancel unless she wants to check vitamin d level again; that had been low

## 2019-08-01 NOTE — Telephone Encounter (Signed)
She had labs done on 06/30/19 Is she coming in to have vit d rechecked ? I wasn't sure what labs to order for her lab appt 08/04/19

## 2019-08-01 NOTE — Telephone Encounter (Signed)
I have cancelled & mychart message was sent to notify patient.

## 2019-08-01 NOTE — Telephone Encounter (Signed)
Please place future orders for lab appt.  

## 2019-08-01 NOTE — Telephone Encounter (Signed)
I have this for a CBC. I don't see a reason to why I would have scheduled this? Is it anything to do with Saxenda? If she needs I will call her back. Otherwise I told her I would check with you & cancel.

## 2019-08-04 ENCOUNTER — Other Ambulatory Visit: Payer: 59

## 2019-08-06 MED FILL — AMPHETAMINE-DEXTROAMPHETAMI: 20 | 90 days supply | Qty: 360 | Fill #0

## 2019-08-11 ENCOUNTER — Ambulatory Visit (INDEPENDENT_AMBULATORY_CARE_PROVIDER_SITE_OTHER): Payer: 59 | Admitting: Family

## 2019-08-11 ENCOUNTER — Other Ambulatory Visit: Payer: Self-pay

## 2019-08-11 ENCOUNTER — Encounter: Payer: Self-pay | Admitting: Family

## 2019-08-11 DIAGNOSIS — R635 Abnormal weight gain: Secondary | ICD-10-CM | POA: Diagnosis not present

## 2019-08-11 MED ORDER — PEN NEEDLES 31G X 6 MM MISC: 1 refills | Status: DC

## 2019-08-11 MED FILL — UNIFINE PENTIPS 6MM 31G: 31G X 6 MM | 90 days supply | Qty: 100 | Fill #0

## 2019-08-11 NOTE — Assessment & Plan Note (Signed)
Very pleased with progress so far. She will continue saxenda and weight watchers. Will follow.

## 2019-08-11 NOTE — Patient Instructions (Signed)
Congrats to you!  Continue saxenda and let us know if you need anything at all.

## 2019-08-11 NOTE — Progress Notes (Signed)
This visit type was conducted due to national recommendations for restrictions regarding the COVID-19 pandemic (e.g. social distancing).  This format is felt to be most appropriate for this patient at this time.  All issues noted in this document were discussed and addressed.  No physical exam was performed (except for noted visual exam findings with Video Visits). Virtual Visit via Video Note  I connected with@  on 08/11/19 at  4:00 PM EDT by a video enabled telemedicine application and verified that I am speaking with the correct person using two identifiers.  Location patient: home Location provider:work  Persons participating in the virtual visit: patient, provider  I discussed the limitations of evaluation and management by telemedicine and the availability of in person appointments. The patient expressed understanding and agreed to proceed.   HPI: Medication follow up.  Pleased with saxenda and feels appetite suppression. Taking 1.8 mg daily at this time. Has lost 5 lbs and very excited.  Started Weight watchers   ROS: See pertinent positives and negatives per HPI.  Past Medical History:  Diagnosis Date  . ADD (attention deficit disorder)   . ANA positive   . Antiphospholipid antibody syndrome ([place]) 11/15/2012  . Antiphospholipid antibody syndrome complicating pregnancy ([place])   . Back pain   . Breast fibroadenoma    history of  . Clotting disorder ([place])    with pregancy  . Depression   . Dysplasia of cervix, low grade (CIN 1) [id number]  . Fatigue   . Fibromyalgia   . GERD (gastroesophageal reflux disease)   . H/O varicella   . Hx of blood clots    During pregnancy  . Joint pain   . Raynaud's disease   . Rosacea   . Vitamin D deficiency     Past Surgical History:  Procedure Laterality Date  . CESAREAN SECTION    . GYNECOLOGIC CRYOSURGERY     of cervix  . right breast fibroadenoma removed      Family History  Problem Relation Age of Onset  . Hypertension Mother    . Stroke Mother   . Heart attack Mother   . Diabetes Mother   . Hyperlipidemia Mother   . Heart disease Mother   . Depression Mother   . Anxiety disorder Mother   . Schizophrenia Mother   . Obesity Mother   . Mental illness Brother        schizophrenia  . Colon cancer Neg Hx   . Esophageal cancer Neg Hx   . Rectal cancer Neg Hx   . Stomach cancer Neg Hx   . Pancreatic cancer Neg Hx   . Thyroid cancer Neg Hx     SOCIAL HX: never smoker   Current Outpatient Medications:  .  amphetamine-dextroamphetamine (ADDERALL) 20 MG tablet, , Disp: , Rfl: 0 .  Liraglutide -Weight Management (SAXENDA) 18 MG/3ML SOPN, Inject 0.6 mg into the skin daily., Disp: 1 pen, Rfl: 3 .  desvenlafaxine (PRISTIQ) 50 MG 24 hr tablet, Take 1 tablet by mouth daily., Disp: , Rfl:  .  Insulin Pen Needle (PEN NEEDLES) 31G X 6 MM MISC, Use one pen needle daily., Disp: 100 each, Rfl: 1  Current Facility-Administered Medications:  .  0.9 %  sodium chloride infusion, 500 mL, Intravenous, Continuous, Pyrtle, Lajuan Lines, MD  EXAM:  VITALS per patient if applicable:  GENERAL: alert, oriented, appears well and in no acute distress  HEENT: atraumatic, conjunttiva clear, no obvious abnormalities on inspection of external nose and ears  NECK:  normal movements of the head and neck  LUNGS: on inspection no signs of respiratory distress, breathing rate appears normal, no obvious gross SOB, gasping or wheezing  CV: no obvious cyanosis  MS: moves all visible extremities without noticeable abnormality  PSYCH/NEURO: pleasant and cooperative, no obvious depression or anxiety, speech and thought processing grossly intact  ASSESSMENT AND PLAN:  Discussed the following assessment and plan:  Problem List Items Addressed This Visit      Other   Weight gain    Very pleased with progress so far. She will continue saxenda and weight watchers. Will follow.            I discussed the assessment and treatment plan with  the patient. The patient was provided an opportunity to ask questions and all were answered. The patient agreed with the plan and demonstrated an understanding of the instructions.   The patient was advised to call back or seek an in-person evaluation if the symptoms worsen or if the condition fails to improve as anticipated.   Mable Paris, FNP

## 2019-08-12 ENCOUNTER — Ambulatory Visit
Admission: RE | Admit: 2019-08-12 | Discharge: 2019-08-12 | Disposition: A | Discharge status: Home / Self Care | Payer: 59 | Source: Ambulatory Visit | Attending: Family | Admitting: Family

## 2019-08-12 ENCOUNTER — Other Ambulatory Visit: Payer: Self-pay

## 2019-08-12 DIAGNOSIS — Z1231 Encounter for screening mammogram for malignant neoplasm of breast: Secondary | ICD-10-CM | POA: Diagnosis not present

## 2019-09-01 ENCOUNTER — Other Ambulatory Visit: Payer: Self-pay

## 2019-09-01 ENCOUNTER — Encounter: Payer: Self-pay | Admitting: Dietician

## 2019-09-01 ENCOUNTER — Encounter: Payer: 59 | Attending: Family | Admitting: Dietician

## 2019-09-01 VITALS — Ht 67.0 in | Wt 239.2 lb

## 2019-09-01 DIAGNOSIS — R635 Abnormal weight gain: Secondary | ICD-10-CM | POA: Insufficient documentation

## 2019-09-01 DIAGNOSIS — E669 Obesity, unspecified: Secondary | ICD-10-CM

## 2019-09-01 NOTE — Progress Notes (Signed)
Medical Nutrition Therapy: Visit start time: 1330  end time: 1430  Assessment:  Diagnosis: obesity Past medical history: antiphospholipid syndrome, rheumatoid Psychosocial issues/ stress concerns: patient reports high stress level, feels she is dealing well with stress  Preferred learning method:  . Visual   Current weight: 239.2lbs Height: 5'7" Medications, supplements: reconciled list in medical record  Progress and evaluation:   Patient reports ongoing struggle with weight control over many years; has been able to lose weight by following low-carb eating pattern in the past, but difficult to maintain. Worked with Dr. Leafy Ro. At one point, she reports losing about 80lbs in 8 months.   She reports some binge eating in the past -- would eat large portions of a food in one sitting, in part related to stress. Denies any binge eating in recent months.   She has begun participating in [employer] program for the past 3 weeks; has also begun taking Saxenda for help with weight loss and has lost about 6lbs so far.   Physical activity: walking 30 minutes 3x a week (started recently)  Dietary Intake:  Usual eating pattern includes 3 meals and 1 snacks per day. Dining out frequency: 2-3 meals per week.  Breakfast: boiled eggs; [employer] sandwich; sometimes only fruit ie banana + apple, maybe tangerine Snack: peanut butter crackers [place]) -- loves this snack but acknowledges high (weight watchers) point value Lunch: choices vary -- 9/21 vegetarian chili; sometimes food brought into work ie sandwich with chips or similar Snack: none recently; was snacking with kids ie chicken nuggets and fries Supper: not hungry since starting Saxenda -- high protein yogurt; healthy choice meal + fruit Snack: none currently Beverages: little water, mostly diet ginger ale, no coffee or tea  Nutrition Care Education: Topics covered: weight control Basic nutrition: basic food groups, appropriate  nutrient balance, appropriate meal and snack schedule, general nutrition guidelines    Weight control: Determining reasonable weight loss rate, importance of low sugar and low fat choices, portion control strategies including measuring portions and filling plat with low-carb veggies and/or fruits, estimated energy needs at 1400kcal for gradual weight loss, provided guidance for 45% CHO, 25% protein, and 30% fat, discussed role of physical activity, inclusion of some treats and avoidance of guilty feelings to help with controlling food cravings and improving sustainability of eating pattern   Nutritional Diagnosis:  [place]-3.3 Overweight/obesity As related to excess calories and limited activity.  As evidenced by patient with current BMI of 37.5, working on dietary and lifestyle changes to promote weight loss.  Intervention:   Instruction and discussion as noted above.  Patient has been working on diet changes and is utilizing pharmaceutical aid to promote weight loss.  Established additional goals for change with direction from patient.  Education Materials given:  . Plate Planner with food lists . Goals/ instructions   Learner/ who was taught:  . Patient    Level of understanding: [place] Kitchen Verbalizes/ demonstrates competency   Demonstrated degree of understanding via:   Teach back Learning barriers: . None  Willingness to learn/ readiness for change: . Eager, change in progress   Monitoring and Evaluation:  Dietary intake, exercise, and body weight      follow up: 10/02/19 at 1:15pm

## 2019-09-01 NOTE — Patient Instructions (Addendum)
   Reduce portions of at least some starchy foods; try sandwich thin-rounds or whole grain wrap/ pita bread in place of sandwich or sub roll or higher-carb loaf bread.   Use less butter, on foods, and less bacon (can try [place] bacon); can try a "light" version of butter or mayo, etc for option of a larger portion.   Continue with emphasis on veggies and fruits, and regular exercise, great job!

## 2019-09-29 DIAGNOSIS — Z01419 Encounter for gynecological examination (general) (routine) without abnormal findings: Secondary | ICD-10-CM | POA: Diagnosis not present

## 2019-09-29 DIAGNOSIS — Z1151 Encounter for screening for human papillomavirus (HPV): Secondary | ICD-10-CM | POA: Diagnosis not present

## 2019-09-29 DIAGNOSIS — Z6837 Body mass index (BMI) 37.0-37.9, adult: Secondary | ICD-10-CM | POA: Diagnosis not present

## 2019-09-29 MED FILL — NORETHINDRONE 0.35 MG TAB: 0.35 | 28 days supply | Qty: 28 | Fill #0

## 2019-10-02 ENCOUNTER — Ambulatory Visit: Payer: 59 | Admitting: Dietician

## 2019-10-17 MED FILL — SAXENDA 18 MG/3 ML PEN: 18 | 30 days supply | Qty: 15 | Fill #0

## 2019-11-16 DIAGNOSIS — H524 Presbyopia: Secondary | ICD-10-CM | POA: Diagnosis not present

## 2019-11-20 ENCOUNTER — Encounter: Payer: Self-pay | Admitting: Dietician

## 2019-11-20 NOTE — Progress Notes (Signed)
Have not heard back from patient to reschedule her cancelled appointment from 10/02/19. Sent notification to referring provider.

## 2019-11-28 MED FILL — AMPHETAMINE-DEXTROAMPHETAMI: 20 | 90 days supply | Qty: 360 | Fill #0

## 2019-12-08 ENCOUNTER — Other Ambulatory Visit: Payer: Self-pay

## 2019-12-08 ENCOUNTER — Telehealth: Payer: Self-pay

## 2019-12-08 DIAGNOSIS — E669 Obesity, unspecified: Secondary | ICD-10-CM

## 2019-12-08 MED ORDER — SAXENDA 18 MG/3ML ~~LOC~~ SOPN: 0.6000 mg | PEN_INJECTOR | Freq: Every day | SUBCUTANEOUS | 3 refills | Status: DC

## 2019-12-08 MED FILL — SAXENDA 18 MG/3 ML PEN: 18 | 30 days supply | Qty: 15 | Fill #1

## 2019-12-08 NOTE — Telephone Encounter (Signed)
I have sent refill in for patient & sent mychart message to please schedule appointment in the next couple months.

## 2019-12-08 NOTE — Telephone Encounter (Signed)
PA for Saxenda was approved 12/02/19-11/30/20.

## 2019-12-08 NOTE — Telephone Encounter (Signed)
Okay refill, however please call patient to make appointment in the next 2 to 3 months

## 2020-01-24 ENCOUNTER — Encounter: Payer: Self-pay | Admitting: Family

## 2020-01-26 ENCOUNTER — Other Ambulatory Visit: Payer: Self-pay | Admitting: Family

## 2020-01-26 DIAGNOSIS — E669 Obesity, unspecified: Secondary | ICD-10-CM

## 2020-01-26 MED ORDER — SAXENDA 18 MG/3ML ~~LOC~~ SOPN: 3.0000 mg | PEN_INJECTOR | Freq: Every day | SUBCUTANEOUS | 3 refills | Status: DC

## 2020-01-26 MED FILL — SAXENDA 18 MG/3 ML PEN: 18 | 30 days supply | Qty: 15 | Fill #0

## 2020-01-26 MED FILL — UNIFINE PENTIPS 31GX3/16: 31G X 5 MM | 90 days supply | Qty: 100 | Fill #1

## 2020-01-26 MED FILL — UNIFINE PENTIPS 31GX3/16": 31G X 5 MM | 90 days supply | Qty: 100 | Fill #1

## 2020-03-26 ENCOUNTER — Other Ambulatory Visit: Payer: Self-pay | Admitting: Family

## 2020-03-26 MED FILL — SAXENDA 18 MG/3 ML PEN: 18 | 30 days supply | Qty: 15 | Fill #1

## 2020-03-31 MED FILL — AMPHETAMINE-DEXTROAMPHETAMI: 20 | 30 days supply | Qty: 120 | Fill #0

## 2020-04-27 MED FILL — SAXENDA 18 MG/3 ML PEN: 18 | 30 days supply | Qty: 15 | Fill #2

## 2020-05-03 MED FILL — AMPHETAMINE-DEXTROAMPHETAMI: 20 | 90 days supply | Qty: 360 | Fill #0

## 2020-06-01 MED FILL — SAXENDA 18 MG/3 ML PEN: 18 | 30 days supply | Qty: 15 | Fill #3

## 2020-08-27 MED FILL — AMPHETAMINE-DEXTROAMPHETAMI: 20 | 90 days supply | Qty: 360 | Fill #0

## 2020-09-19 ENCOUNTER — Ambulatory Visit (INDEPENDENT_AMBULATORY_CARE_PROVIDER_SITE_OTHER): Payer: 59

## 2020-09-19 ENCOUNTER — Other Ambulatory Visit: Payer: Self-pay

## 2020-09-19 ENCOUNTER — Ambulatory Visit
Admission: RE | Admit: 2020-09-19 | Discharge: 2020-09-19 | Disposition: A | Discharge status: Home / Self Care | Payer: 59 | Source: Ambulatory Visit

## 2020-09-19 VITALS — BP 110/60 | HR 70 | Temp 98.0°F | Resp 14 | Ht 67.0 in | Wt 238.0 lb

## 2020-09-19 DIAGNOSIS — M5416 Radiculopathy, lumbar region: Secondary | ICD-10-CM

## 2020-09-19 DIAGNOSIS — M5137 Other intervertebral disc degeneration, lumbosacral region: Secondary | ICD-10-CM | POA: Diagnosis not present

## 2020-09-19 DIAGNOSIS — M79661 Pain in right lower leg: Secondary | ICD-10-CM

## 2020-09-19 DIAGNOSIS — M545 Low back pain, unspecified: Secondary | ICD-10-CM | POA: Diagnosis not present

## 2020-09-19 MED ORDER — PREDNISONE 10 MG (21) PO TBPK: ORAL_TABLET | Freq: Every day | ORAL | 0 refills | Status: DC

## 2020-09-19 NOTE — ED Provider Notes (Signed)
[facility]    CSN: [id number] Arrival date & time: 09/19/20  1400      History   Chief Complaint Chief Complaint  Patient presents with  . Leg Pain    right    HPI Molly Sanchez is a 49 y.o. female.   49 yo female here for evaluation of tingling and a feeling of water being poured over her thigh, R>L. There have also been episodes of quick jolts down the front of her shin, and occasional pain down the outside of her right leg. She has felt like she has been dragging her right foot off and on. Her symptoms are worse getting in and out of the car. She has not had any numbness of her perineum or loss of bowel or bladder control. She did have one episode of feeling like her left grip was weaker that her right but not loss of motor control, no HA. She has had low back pain and right scapular pain.      Past Medical History:  Diagnosis Date  . ADD (attention deficit disorder)   . ANA positive   . Antiphospholipid antibody syndrome ([place]) 11/15/2012  . Antiphospholipid antibody syndrome complicating pregnancy ([place])   . Back pain   . Breast fibroadenoma    history of  . Clotting disorder ([place])    with pregancy  . Depression   . Dysplasia of cervix, low grade (CIN 1) [id number]  . Fatigue   . Fibromyalgia   . GERD (gastroesophageal reflux disease)   . H/O varicella   . Hx of blood clots    During pregnancy  . Joint pain   . Raynaud's disease   . Rosacea   . Vitamin D deficiency     Patient Active Problem List   Diagnosis Date Noted  . Goals of care, counseling/discussion 03/22/2018  . Insulin resistance 02/13/2017  . Class 2 obesity without serious comorbidity with body mass index (BMI) of 37.0 to 37.9 in adult 02/13/2017  . Rheumatoid factor positive 07/07/2016  . Routine general medical examination at a health care facility 02/17/2016  . Encounter to establish care 01/22/2016  . GERD (gastroesophageal reflux disease) 01/22/2016  . Weight gain  01/22/2016  . ADHD (attention deficit hyperactivity disorder) 01/22/2016  . Depression 01/22/2016  . Antiphospholipid antibody syndrome ([place]) 11/15/2012  . Dysplasia of cervix, low grade (CIN 1)   . Rosacea   . Fibromyalgia   . ANA positive   . Raynaud's disease     Past Surgical History:  Procedure Laterality Date  . CESAREAN SECTION    . GYNECOLOGIC CRYOSURGERY     of cervix  . right breast fibroadenoma removed      OB History    Gravida  9   Para  5   Term  4   Preterm  1   AB  4   Living  4     SAB  2   TAB  2   Ectopic      Multiple      Live Births  5            Home Medications    Prior to Admission medications   Medication Sig Start Date End Date Taking? Authorizing Provider  amphetamine-dextroamphetamine (ADDERALL) 20 MG tablet  11/03/15  Yes [provider]  aspirin 81 MG chewable tablet Chew 81 mg by mouth daily.   Yes [provider]  Multiple Vitamins-Minerals (MULTIVITAMIN ADULT PO) Take by mouth. Taking  [place] apple cider vinegar vitamin supplement   Yes [provider]  Insulin Pen Needle (PEN NEEDLES) 31G X 6 MM MISC Use one pen needle daily. 08/11/19   Burnard Hawthorne, FNP  predniSONE (STERAPRED UNI-PAK 21 TAB) 10 MG (21) TBPK tablet Take by mouth daily. Take 6 tabs by mouth daily  for 2 days, then 5 tabs for 2 days, then 4 tabs for 2 days, then 3 tabs for 2 days, 2 tabs for 2 days, then 1 tab by mouth daily for 2 days 09/19/20   Margarette Canada, NP    Family History Family History  Problem Relation Age of Onset  . Hypertension Mother   . Stroke Mother   . Heart attack Mother   . Diabetes Mother   . Hyperlipidemia Mother   . Heart disease Mother   . Depression Mother   . Anxiety disorder Mother   . Schizophrenia Mother   . Obesity Mother   . Mental illness Brother        schizophrenia  . Colon cancer Neg Hx   . Esophageal cancer Neg Hx   . Rectal cancer Neg Hx   . Stomach cancer Neg Hx   .  Pancreatic cancer Neg Hx   . Thyroid cancer Neg Hx     Social History Social History   Tobacco Use  . Smoking status: Never Smoker  . Smokeless tobacco: Never Used  Vaping Use  . Vaping Use: Never used  Substance Use Topics  . Alcohol use: No    Alcohol/week: 1.0 standard drink    Types: 1 Glasses of wine per week    Comment: none   . Drug use: No     Allergies   Patient has no known allergies.   Review of Systems Review of Systems  Constitutional: Negative for activity change, appetite change, fatigue and fever.  HENT: Negative for congestion and rhinorrhea.   Respiratory: Negative for cough and shortness of breath.   Cardiovascular: Negative for chest pain.  Gastrointestinal: Negative for diarrhea and nausea.  Genitourinary: Negative for difficulty urinating and dysuria.  Musculoskeletal: Positive for back pain and gait problem. Negative for arthralgias, myalgias, neck pain and neck stiffness.  Skin: Negative for rash.  Neurological: Positive for weakness and numbness. Negative for dizziness, tremors, syncope, facial asymmetry, light-headedness and headaches.  Hematological: Negative.   Psychiatric/Behavioral: Negative.      Physical Exam Triage Vital Signs ED Triage Vitals  Enc Vitals Group     BP 09/19/20 1418 110/60     Pulse Rate 09/19/20 1418 70     Resp 09/19/20 1418 14     Temp 09/19/20 1418 98 F (36.7 C)     Temp Source 09/19/20 1418 Oral     SpO2 09/19/20 1418 100 %     Weight 09/19/20 1414 238 lb (108 kg)     Height 09/19/20 1414 5\' 7"  (1.702 m)     Head Circumference --      Peak Flow --      Pain Score 09/19/20 1414 5     Pain Loc --      Pain Edu? --      Excl. in [place]? --    No data found.  Updated Vital Signs BP 110/60 (BP Location: Right Arm)   Pulse 70   Temp 98 F (36.7 C) (Oral)   Resp 14   Ht 5\' 7"  (1.702 m)   Wt 238 lb (108 kg)   LMP 09/05/2020 (Exact Date)  SpO2 100%   BMI 37.28 kg/m   Visual Acuity Right Eye  Distance:   Left Eye Distance:   Bilateral Distance:    Right Eye Near:   Left Eye Near:    Bilateral Near:     Physical Exam Vitals and nursing note reviewed.  Constitutional:      General: She is not in acute distress.    Appearance: She is not toxic-appearing.  HENT:     Head: Normocephalic and atraumatic.     Mouth/Throat:     Mouth: Mucous membranes are moist.     Pharynx: Oropharynx is clear. No oropharyngeal exudate or posterior oropharyngeal erythema.  Eyes:     General: Lids are normal.        Right eye: No discharge.        Left eye: No discharge.     Extraocular Movements: Extraocular movements intact.     Right eye: No nystagmus.     Left eye: No nystagmus.     Conjunctiva/sclera:     Right eye: Right conjunctiva is not injected. No exudate.    Left eye: Left conjunctiva is not injected. No exudate. Cardiovascular:     Rate and Rhythm: Normal rate and regular rhythm.     Pulses: Normal pulses.     Heart sounds: Normal heart sounds, S1 normal and S2 normal. Heart sounds not distant. No murmur heard.  No gallop.   Pulmonary:     Effort: Pulmonary effort is normal. No respiratory distress.     Breath sounds: Normal breath sounds and air entry. No decreased air movement. No decreased breath sounds, rhonchi or rales.  Musculoskeletal:     Right lower leg: No edema.     Left lower leg: No edema.     Comments: 5/5 grips, push-pull, and shoulder strength.   4/5 left leg strength, 5/5 right leg strength. Patellar and achilles reflexes brisk and equal, negative babinski.   Positive right straight leg raise. Low back soft tissue tenderness L4-L5-S1. No bony tenderness.  Neurological:     General: No focal deficit present.     Mental Status: She is alert.     GCS: GCS eye subscore is 4. GCS motor subscore is 6.     Cranial Nerves: Cranial nerves are intact. No cranial nerve deficit or facial asymmetry.     Sensory: Sensation is intact.     Motor: Weakness present.  No tremor or abnormal muscle tone.     Gait: Gait is intact. Tandem walk normal.     Comments: Patient observed to walk and her gait was steady and smooth without evidence of foot drop. Walking on heels and balls of feet were also smooth and normal. She was able to discriminate touch sensation without looking in both feet.   Psychiatric:        Attention and Perception: Attention and perception normal.        Mood and Affect: Mood and affect normal. Mood is not anxious or depressed.        Speech: Speech normal. Speech is not rapid and pressured or slurred.        Behavior: Behavior normal. Behavior is cooperative.        Thought Content: Thought content normal.        Cognition and Memory: Cognition and memory normal.        Judgment: Judgment normal.      UC Treatments / Results  Labs (all labs ordered are listed, but only abnormal  results are displayed) Labs Reviewed - No data to display  EKG   Radiology DG Lumbar Spine Complete  Result Date: 09/19/2020 CLINICAL DATA:  Right lower extremity pain. EXAM: LUMBAR SPINE - COMPLETE 4+ VIEW COMPARISON:  None. FINDINGS: Normal anatomic alignment. No evidence for acute fracture or dislocation. L5-S1 degenerative disc and facet disease. SI joints unremarkable. Unremarkable bowel gas pattern. IMPRESSION: L5-S1 degenerative disc and facet disease. Electronically Signed   By: Lovey Newcomer M.D.   On: 09/19/2020 15:35    Procedures Procedures (including critical care time)  Medications Ordered in UC Medications - No data to display  Initial Impression / Assessment and Plan / UC Course  I have reviewed the triage vital signs and the nursing notes.  Pertinent labs & imaging results that were available during my care of the patient were reviewed by me and considered in my medical decision making (see chart for details).   Patient has been experiencing paresthesias in her legs, R>L for 3 weeks. She describes the feeling as having warm water  poured over her thighs and electric pains down her shin. The sensations come and go, they are made worse by getting in and out of her car. She has felt like she has been dragging he right foot at times but not currently and tandem gait is smooth and steady without foot drop. She does have some mild weakness to her left lower leg, but her sensation and reflexes are brisk and intact. She does have a positive straight leg raise on the right. There is soft tissue tenderness to the Lumbar Paraspinous region on the right. Cranial nerves are intact. No other neuro deficits appreciated on exam.  Will image lumbar spine. Suspect partially her symptoms are coming from sciatica. Will also refer to spine for evaluation.   Lumbar spine films show L5-S1 degenerative disk and facet disease. Will treat with Prednisone and refer to spine.  Final Clinical Impressions(s) / UC Diagnoses   Final diagnoses:  Lumbar radiculopathy  Degeneration of lumbar or lumbosacral intervertebral disc     Discharge Instructions     Take the Prednisone as directed by the package insert.  I will refer you to the Spine center for evaluation and definitive treatment.  Return for new or worsening symptoms.     ED Prescriptions    Medication Sig Dispense Auth. Provider   predniSONE (STERAPRED UNI-PAK 21 TAB) 10 MG (21) TBPK tablet Take by mouth daily. Take 6 tabs by mouth daily  for 2 days, then 5 tabs for 2 days, then 4 tabs for 2 days, then 3 tabs for 2 days, 2 tabs for 2 days, then 1 tab by mouth daily for 2 days 42 tablet Margarette Canada, NP     PDMP not reviewed this encounter.   Margarette Canada, NP 09/19/20 1544

## 2020-09-19 NOTE — Discharge Instructions (Addendum)
Take the Prednisone as directed by the package insert.  I will refer you to the Spine center for evaluation and definitive treatment.  Return for new or worsening symptoms.

## 2020-09-19 NOTE — ED Triage Notes (Signed)
Patient states that she has been having what she describes as a tingling shooting pain in her right lower leg that started 3 weeks.  Patient also reports lower back pain and upper right sided back pain that started yesterday.

## 2020-10-02 ENCOUNTER — Encounter (HOSPITAL_COMMUNITY): Payer: Self-pay | Admitting: Emergency Medicine

## 2020-10-02 ENCOUNTER — Other Ambulatory Visit: Payer: Self-pay

## 2020-10-02 ENCOUNTER — Emergency Department (HOSPITAL_COMMUNITY)
Admission: EM | Admit: 2020-10-02 | Discharge: 2020-10-03 | Disposition: A | Discharge status: Home / Self Care | Payer: 59 | Attending: Emergency Medicine | Admitting: Emergency Medicine

## 2020-10-02 DIAGNOSIS — Z7982 Long term (current) use of aspirin: Secondary | ICD-10-CM | POA: Diagnosis not present

## 2020-10-02 DIAGNOSIS — M79604 Pain in right leg: Secondary | ICD-10-CM | POA: Insufficient documentation

## 2020-10-02 DIAGNOSIS — M79661 Pain in right lower leg: Secondary | ICD-10-CM | POA: Diagnosis not present

## 2020-10-02 NOTE — ED Triage Notes (Signed)
Pt c/o right calf pain no swollen noticed during triage. Pt denies any long driving or injury.

## 2020-10-03 ENCOUNTER — Emergency Department (HOSPITAL_BASED_OUTPATIENT_CLINIC_OR_DEPARTMENT_OTHER)
Admission: RE | Admit: 2020-10-03 | Discharge: 2020-10-03 | Disposition: A | Discharge status: Home / Self Care | Payer: 59 | Source: Ambulatory Visit | Attending: Emergency Medicine | Admitting: Emergency Medicine

## 2020-10-03 DIAGNOSIS — M79604 Pain in right leg: Secondary | ICD-10-CM | POA: Diagnosis not present

## 2020-10-03 DIAGNOSIS — Z7982 Long term (current) use of aspirin: Secondary | ICD-10-CM | POA: Diagnosis not present

## 2020-10-03 DIAGNOSIS — M79609 Pain in unspecified limb: Secondary | ICD-10-CM

## 2020-10-03 MED ORDER — RIVAROXABAN 15 MG PO TABS
15.0000 mg | ORAL_TABLET | Freq: Once | ORAL | Status: AC
  Administered 2020-10-03: 15 mg via ORAL
  Filled 2020-10-03: qty 1

## 2020-10-03 NOTE — Progress Notes (Signed)
Right Lower Ext. study completed.   See CVProc for preliminary results.   Griffin Basil, RDMS, RVT

## 2020-10-03 NOTE — ED Provider Notes (Addendum)
[facility] DEPARTMENT Provider Note   CSN: [id number] Arrival date & time: 10/02/20  2140     History Chief Complaint  Patient presents with  . Leg Pain    Molly Sanchez is a 49 y.o. female.  Patient is a 49 year old female who presents with right leg pain.  She does have a history of antiphospholipid antibody syndrome.  She has had a 2-day history of some pain in her right leg.  She describes as a throbbing pain from the mid thigh down into the calf.  She noticed some swelling to her foot.  She denies any chest pain or shortness of breath.  No fevers.  No injury to the area.  She is concerned about a blood clot.  She had prior history of blood clots during pregnancy and was on Lovenox injections.  She is not currently on anticoagulants.  She was recently seen for some radicular back pain.  She was treated with a course of steroids.  She says her back pain has completely resolved.  At that time she did have some back pain that radiated down her right sciatic nerve.  She does not feel that there is any current back pain or radiation from her back with the symptoms.  She denies any numbness or weakness to the leg.        Past Medical History:  Diagnosis Date  . ADD (attention deficit disorder)   . ANA positive   . Antiphospholipid antibody syndrome ([place]) 11/15/2012  . Antiphospholipid antibody syndrome complicating pregnancy ([place])   . Back pain   . Breast fibroadenoma    history of  . Clotting disorder ([place])    with pregancy  . Depression   . Dysplasia of cervix, low grade (CIN 1) [id number]  . Fatigue   . Fibromyalgia   . GERD (gastroesophageal reflux disease)   . H/O varicella   . Hx of blood clots    During pregnancy  . Joint pain   . Raynaud's disease   . Rosacea   . Vitamin D deficiency     Patient Active Problem List   Diagnosis Date Noted  . Goals of care, counseling/discussion 03/22/2018  . Insulin resistance 02/13/2017  .  Class 2 obesity without serious comorbidity with body mass index (BMI) of 37.0 to 37.9 in adult 02/13/2017  . Rheumatoid factor positive 07/07/2016  . Routine general medical examination at a health care facility 02/17/2016  . Encounter to establish care 01/22/2016  . GERD (gastroesophageal reflux disease) 01/22/2016  . Weight gain 01/22/2016  . ADHD (attention deficit hyperactivity disorder) 01/22/2016  . Depression 01/22/2016  . Antiphospholipid antibody syndrome ([place]) 11/15/2012  . Dysplasia of cervix, low grade (CIN 1)   . Rosacea   . Fibromyalgia   . ANA positive   . Raynaud's disease     Past Surgical History:  Procedure Laterality Date  . CESAREAN SECTION    . GYNECOLOGIC CRYOSURGERY     of cervix  . right breast fibroadenoma removed       OB History    Gravida  9   Para  5   Term  4   Preterm  1   AB  4   Living  4     SAB  2   TAB  2   Ectopic      Multiple      Live Births  5           Family History  Problem Relation Age of Onset  . Hypertension Mother   . Stroke Mother   . Heart attack Mother   . Diabetes Mother   . Hyperlipidemia Mother   . Heart disease Mother   . Depression Mother   . Anxiety disorder Mother   . Schizophrenia Mother   . Obesity Mother   . Mental illness Brother        schizophrenia  . Colon cancer Neg Hx   . Esophageal cancer Neg Hx   . Rectal cancer Neg Hx   . Stomach cancer Neg Hx   . Pancreatic cancer Neg Hx   . Thyroid cancer Neg Hx     Social History   Tobacco Use  . Smoking status: Never Smoker  . Smokeless tobacco: Never Used  Vaping Use  . Vaping Use: Never used  Substance Use Topics  . Alcohol use: No    Alcohol/week: 1.0 standard drink    Types: 1 Glasses of wine per week    Comment: none   . Drug use: No    Home Medications Prior to Admission medications   Medication Sig Start Date End Date Taking? Authorizing Provider  amphetamine-dextroamphetamine (ADDERALL) 20 MG tablet  11/03/15    [provider]  aspirin 81 MG chewable tablet Chew 81 mg by mouth daily.    [provider]  Insulin Pen Needle (PEN NEEDLES) 31G X 6 MM MISC Use one pen needle daily. 08/11/19   Burnard Hawthorne, FNP  Multiple Vitamins-Minerals (MULTIVITAMIN ADULT PO) Take by mouth. Taking [place] apple cider vinegar vitamin supplement    [provider]  predniSONE (STERAPRED UNI-PAK 21 TAB) 10 MG (21) TBPK tablet Take by mouth daily. Take 6 tabs by mouth daily  for 2 days, then 5 tabs for 2 days, then 4 tabs for 2 days, then 3 tabs for 2 days, 2 tabs for 2 days, then 1 tab by mouth daily for 2 days 09/19/20   Margarette Canada, NP    Allergies    Patient has no known allergies.  Review of Systems   Review of Systems  Constitutional: Negative for chills, diaphoresis, fatigue and fever.  HENT: Negative for congestion, rhinorrhea and sneezing.   Eyes: Negative.   Respiratory: Negative for cough, chest tightness and shortness of breath.   Cardiovascular: Positive for leg swelling. Negative for chest pain.  Gastrointestinal: Negative for abdominal pain, blood in stool, diarrhea, nausea and vomiting.  Genitourinary: Negative for difficulty urinating, flank pain, frequency and hematuria.  Musculoskeletal: Positive for myalgias. Negative for arthralgias and back pain.  Skin: Negative for rash.  Neurological: Negative for dizziness, speech difficulty, weakness, numbness and headaches.    Physical Exam Updated Vital Signs BP (!) 122/55 (BP Location: Right Arm)   Pulse 69   Temp 98.1 F (36.7 C) (Oral)   Resp 16   Ht 5\' 7"  (1.702 m) Comment: Simultaneous filing. User may not have seen previous data.  Wt 108 kg Comment: Simultaneous filing. User may not have seen previous data.  LMP 09/05/2020 (Exact Date)   SpO2 99%   BMI 37.29 kg/m   Physical Exam Constitutional:      Appearance: She is well-developed.  HENT:     Head: Normocephalic and atraumatic.  Eyes:     Pupils: Pupils  are equal, round, and reactive to light.  Cardiovascular:     Rate and Rhythm: Normal rate and regular rhythm.     Heart sounds: Normal heart sounds.  Pulmonary:  Effort: Pulmonary effort is normal. No respiratory distress.     Breath sounds: Normal breath sounds. No wheezing or rales.  Chest:     Chest wall: No tenderness.  Abdominal:     General: Bowel sounds are normal.     Palpations: Abdomen is soft.     Tenderness: There is no abdominal tenderness. There is no guarding or rebound.  Musculoskeletal:        General: Normal range of motion.     Cervical back: Normal range of motion and neck supple.     Comments: I do not appreciate any swelling to the right leg.  There is no palpable tenderness.  There is no warmth or erythema.  Pedal pulses are intact.  She has normal sensation and motor function to the lower extremities.  No palpable back pain or pain over the sciatic nerves.  Lymphadenopathy:     Cervical: No cervical adenopathy.  Skin:    General: Skin is warm and dry.     Findings: No rash.  Neurological:     Mental Status: She is alert and oriented to person, place, and time.     ED Results / Procedures / Treatments   Labs (all labs ordered are listed, but only abnormal results are displayed) Labs Reviewed - No data to display  EKG None  Radiology No results found.  Procedures Procedures (including critical care time)  Medications Ordered in ED Medications  Rivaroxaban (XARELTO) tablet 15 mg (has no administration in time range)    ED Course  I have reviewed the triage vital signs and the nursing notes.  Pertinent labs & imaging results that were available during my care of the patient were reviewed by me and considered in my medical decision making (see chart for details).    MDM Rules/Calculators/A&P                          Patient presents with right leg pain.  She is concerned about a DVT given her prior history.  Unfortunately, we are unable to  obtain an ultrasound tonight.  I have given her a dose of Xarelto and ordered the ultrasound for tomorrow.  She does not have any shortness of breath or other suggestions of PE.  If the ultrasound is negative, this could represent more of a radicular syndrome from her back.  I advised her to follow-up with her PCP for recheck following ultrasound.  She does not require any medication for pain.  She was discharged home in good condition.  Return precautions were given.  Of note, her last menstrual period in the computer as documented is September 26 although patient said she had a normal menstrual cycle 2 weeks ago and has not missed any periods.  She does not have any concern for pregnancy. Final Clinical Impression(s) / ED Diagnoses Final diagnoses:  Right leg pain    Rx / DC Orders ED Discharge Orders         Ordered    VAS [place] LOWER EXTREMITY VENOUS (DVT)        10/03/20 2094           Malvin Johns, MD 10/03/20 7096    Malvin Johns, MD 10/03/20 2836

## 2020-10-03 NOTE — Discharge Instructions (Addendum)
Keep your leg elevated.  Return for the ultrasound as directed.  Follow-up with your primary care doctor within the next few days for recheck.  Return here as needed if you have any worsening symptoms.

## 2020-10-15 ENCOUNTER — Other Ambulatory Visit: Payer: Self-pay | Admitting: Adult Health

## 2020-10-15 DIAGNOSIS — U071 COVID-19: Secondary | ICD-10-CM

## 2020-10-15 NOTE — Progress Notes (Signed)
I connected by phone with Molly Sanchez on 10/15/2020 at 5:45 PM to discuss the potential use of a new treatment for mild to moderate COVID-19 viral infection in non-hospitalized patients.  This patient is a 49 y.o. female that meets the FDA criteria for Emergency Use Authorization of COVID monoclonal antibody casirivimab/imdevimab, bamlanivimab/eteseviamb, or sotrovimab.  Has a (+) direct SARS-CoV-2 viral test result  Has mild or moderate COVID-19   Is NOT hospitalized due to COVID-19  Is within 10 days of symptom onset  Has at least one of the high risk factor(s) for progression to severe COVID-19 and/or hospitalization as defined in EUA.  Specific high risk criteria : BMI > 25   I have spoken and communicated the following to the patient or parent/caregiver regarding COVID monoclonal antibody treatment:  1. FDA has authorized the emergency use for the treatment of mild to moderate COVID-19 in adults and pediatric patients with positive results of direct SARS-CoV-2 viral testing who are 15 years of age and older weighing at least 40 kg, and who are at high risk for progressing to severe COVID-19 and/or hospitalization.  2. The significant known and potential risks and benefits of COVID monoclonal antibody, and the extent to which such potential risks and benefits are unknown.  3. Information on available alternative treatments and the risks and benefits of those alternatives, including clinical trials.  4. Patients treated with COVID monoclonal antibody should continue to self-isolate and use infection control measures (e.g., wear mask, isolate, social distance, avoid sharing personal items, clean and disinfect "high touch" surfaces, and frequent handwashing) according to CDC guidelines.   5. The patient or parent/caregiver has the option to accept or refuse COVID monoclonal antibody treatment.  After reviewing this information with the patient, the patient has agreed to receive  one of the available covid 19 monoclonal antibodies and will be provided an appropriate fact sheet prior to infusion. Scot Dock, NP 10/15/2020 5:45 PM

## 2020-10-16 ENCOUNTER — Ambulatory Visit (HOSPITAL_COMMUNITY)
Admission: RE | Admit: 2020-10-16 | Discharge: 2020-10-16 | Disposition: A | Discharge status: Home / Self Care | Payer: 59 | Source: Ambulatory Visit | Attending: Pulmonary Disease | Admitting: Pulmonary Disease

## 2020-10-16 DIAGNOSIS — Z6825 Body mass index (BMI) 25.0-25.9, adult: Secondary | ICD-10-CM | POA: Diagnosis not present

## 2020-10-16 DIAGNOSIS — U071 COVID-19: Secondary | ICD-10-CM | POA: Diagnosis not present

## 2020-10-16 MED ORDER — DIPHENHYDRAMINE HCL 50 MG/ML IJ SOLN: 50.0000 mg | Freq: Once | INTRAMUSCULAR | Status: DC | PRN

## 2020-10-16 MED ORDER — ALBUTEROL SULFATE HFA 108 (90 BASE) MCG/ACT IN AERS: 2.0000 | INHALATION_SPRAY | Freq: Once | RESPIRATORY_TRACT | Status: DC | PRN

## 2020-10-16 MED ORDER — FAMOTIDINE IN NACL 20-0.9 MG/50ML-% IV SOLN: 20.0000 mg | Freq: Once | INTRAVENOUS | Status: DC | PRN

## 2020-10-16 MED ORDER — METHYLPREDNISOLONE SODIUM SUCC 125 MG IJ SOLR: 125.0000 mg | Freq: Once | INTRAMUSCULAR | Status: DC | PRN

## 2020-10-16 MED ORDER — SOTROVIMAB 500 MG/8ML IV SOLN
500.0000 mg | Freq: Once | INTRAVENOUS | Status: AC
  Administered 2020-10-16: 500 mg via INTRAVENOUS

## 2020-10-16 MED ORDER — EPINEPHRINE 0.3 MG/0.3ML IJ SOAJ: 0.3000 mg | Freq: Once | INTRAMUSCULAR | Status: DC | PRN

## 2020-10-16 MED ORDER — SODIUM CHLORIDE 0.9 % IV SOLN: INTRAVENOUS | Status: DC | PRN

## 2020-10-16 NOTE — Discharge Instructions (Signed)
COVID-19 COVID-19 is a respiratory infection that is caused by a virus called severe acute respiratory syndrome coronavirus 2 (SARS-CoV-2). The disease is also known as coronavirus disease or novel coronavirus. In some people, the virus may not cause any symptoms. In others, it may cause a serious infection. The infection can get worse quickly and can lead to complications, such as:  Pneumonia, or infection of the lungs.  Acute respiratory distress syndrome or ARDS. This is a condition in which fluid build-up in the lungs prevents the lungs from filling with air and passing oxygen into the blood.  Acute respiratory failure. This is a condition in which there is not enough oxygen passing from the lungs to the body or when carbon dioxide is not passing from the lungs out of the body.  Sepsis or septic shock. This is a serious bodily reaction to an infection.  Blood clotting problems.  Secondary infections due to bacteria or fungus.  Organ failure. This is when your body's organs stop working. The virus that causes COVID-19 is contagious. This means that it can spread from person to person through droplets from coughs and sneezes (respiratory secretions). What are the causes? This illness is caused by a virus. You may catch the virus by:  Breathing in droplets from an infected person. Droplets can be spread by a person breathing, speaking, singing, coughing, or sneezing.  Touching something, like a table or a doorknob, that was exposed to the virus (contaminated) and then touching your mouth, nose, or eyes. What increases the risk? Risk for infection You are more likely to be infected with this virus if you:  Are within 6 feet (2 meters) of a person with COVID-19.  Provide care for or live with a person who is infected with COVID-19.  Spend time in crowded indoor spaces or live in shared housing. Risk for serious illness You are more likely to become seriously ill from the virus if  you:  Are 50 years of age or older. The higher your age, the more you are at risk for serious illness.  Live in a nursing home or long-term care facility.  Have cancer.  Have a long-term (chronic) disease such as: ? Chronic lung disease, including chronic obstructive pulmonary disease or asthma. ? A long-term disease that lowers your body's ability to fight infection (immunocompromised). ? Heart disease, including heart failure, a condition in which the arteries that lead to the heart become narrow or blocked (coronary artery disease), a disease which makes the heart muscle thick, weak, or stiff (cardiomyopathy). ? Diabetes. ? Chronic kidney disease. ? Sickle cell disease, a condition in which red blood cells have an abnormal "sickle" shape. ? Liver disease.  Are obese. What are the signs or symptoms? Symptoms of this condition can range from mild to severe. Symptoms may appear any time from 2 to 14 days after being exposed to the virus. They include:  A fever or chills.  A cough.  Difficulty breathing.  Headaches, body aches, or muscle aches.  Runny or stuffy (congested) nose.  A sore throat.  New loss of taste or smell. Some people may also have stomach problems, such as nausea, vomiting, or diarrhea. Other people may not have any symptoms of COVID-19. How is this diagnosed? This condition may be diagnosed based on:  Your signs and symptoms, especially if: ? You live in an area with a COVID-19 outbreak. ? You recently traveled to or from an area where the virus is common. ? You   provide care for or live with a person who was diagnosed with COVID-19. ? You were exposed to a person who was diagnosed with COVID-19.  A physical exam.  Lab tests, which may include: ? Taking a sample of fluid from the back of your nose and throat (nasopharyngeal fluid), your nose, or your throat using a swab. ? A sample of mucus from your lungs (sputum). ? Blood tests.  Imaging tests,  which may include, X-rays, CT scan, or ultrasound. How is this treated? At present, there is no medicine to treat COVID-19. Medicines that treat other diseases are being used on a trial basis to see if they are effective against COVID-19. Your health care provider will talk with you about ways to treat your symptoms. For most people, the infection is mild and can be managed at home with rest, fluids, and over-the-counter medicines. Treatment for a serious infection usually takes places in a hospital intensive care unit (ICU). It may include one or more of the following treatments. These treatments are given until your symptoms improve.  Receiving fluids and medicines through an IV.  Supplemental oxygen. Extra oxygen is given through a tube in the nose, a face mask, or a hood.  Positioning you to lie on your stomach (prone position). This makes it easier for oxygen to get into the lungs.  Continuous positive airway pressure (CPAP) or bi-level positive airway pressure (BPAP) machine. This treatment uses mild air pressure to keep the airways open. A tube that is connected to a motor delivers oxygen to the body.  Ventilator. This treatment moves air into and out of the lungs by using a tube that is placed in your windpipe.  Tracheostomy. This is a procedure to create a hole in the neck so that a breathing tube can be inserted.  Extracorporeal membrane oxygenation (ECMO). This procedure gives the lungs a chance to recover by taking over the functions of the heart and lungs. It supplies oxygen to the body and removes carbon dioxide. Follow these instructions at home: Lifestyle  If you are sick, stay home except to get medical care. Your health care provider will tell you how long to stay home. Call your health care provider before you go for medical care.  Rest at home as told by your health care provider.  Do not use any products that contain nicotine or tobacco, such as cigarettes,  e-cigarettes, and chewing tobacco. If you need help quitting, ask your health care provider.  Return to your normal activities as told by your health care provider. Ask your health care provider what activities are safe for you. General instructions  Take over-the-counter and prescription medicines only as told by your health care provider.  Drink enough fluid to keep your urine pale yellow.  Keep all follow-up visits as told by your health care provider. This is important. How is this prevented?  There is no vaccine to help prevent COVID-19 infection. However, there are steps you can take to protect yourself and others from this virus. To protect yourself:   Do not travel to areas where COVID-19 is a risk. The areas where COVID-19 is reported change often. To identify high-risk areas and travel restrictions, check the CDC travel website: wwwnc.cdc.gov/travel/notices  If you live in, or must travel to, an area where COVID-19 is a risk, take precautions to avoid infection. ? Stay away from people who are sick. ? Wash your hands often with soap and water for 20 seconds. If soap and water   are not available, use an alcohol-based hand sanitizer. ? Avoid touching your mouth, face, eyes, or nose. ? Avoid going out in public, follow guidance from your state and local health authorities. ? If you must go out in public, wear a cloth face covering or face mask. Make sure your mask covers your nose and mouth. ? Avoid crowded indoor spaces. Stay at least 6 feet (2 meters) away from others. ? Disinfect objects and surfaces that are frequently touched every day. This may include:  Counters and tables.  Doorknobs and light switches.  Sinks and faucets.  Electronics, such as phones, remote controls, keyboards, computers, and tablets. To protect others: If you have symptoms of COVID-19, take steps to prevent the virus from spreading to others.  If you think you have a COVID-19 infection, contact  your health care provider right away. Tell your health care team that you think you may have a COVID-19 infection.  Stay home. Leave your house only to seek medical care. Do not use public transport.  Do not travel while you are sick.  Wash your hands often with soap and water for 20 seconds. If soap and water are not available, use alcohol-based hand sanitizer.  Stay away from other members of your household. Let healthy household members care for children and pets, if possible. If you have to care for children or pets, wash your hands often and wear a mask. If possible, stay in your own room, separate from others. Use a different bathroom.  Make sure that all people in your household wash their hands well and often.  Cough or sneeze into a tissue or your sleeve or elbow. Do not cough or sneeze into your hand or into the air.  Wear a cloth face covering or face mask. Make sure your mask covers your nose and mouth. Where to find more information  Centers for Disease Control and Prevention: www.cdc.gov/coronavirus/2019-ncov/index.html  World Health Organization: www.who.int/health-topics/coronavirus Contact a health care provider if:  You live in or have traveled to an area where COVID-19 is a risk and you have symptoms of the infection.  You have had contact with someone who has COVID-19 and you have symptoms of the infection. Get help right away if:  You have trouble breathing.  You have pain or pressure in your chest.  You have confusion.  You have bluish lips and fingernails.  You have difficulty waking from sleep.  You have symptoms that get worse. These symptoms may represent a serious problem that is an emergency. Do not wait to see if the symptoms will go away. Get medical help right away. Call your local emergency services (911 in the [place] Do not drive yourself to the hospital. Let the emergency medical personnel know if you think you have  COVID-19. Summary  COVID-19 is a respiratory infection that is caused by a virus. It is also known as coronavirus disease or novel coronavirus. It can cause serious infections, such as pneumonia, acute respiratory distress syndrome, acute respiratory failure, or sepsis.  The virus that causes COVID-19 is contagious. This means that it can spread from person to person through droplets from breathing, speaking, singing, coughing, or sneezing.  You are more likely to develop a serious illness if you are 50 years of age or older, have a weak immune system, live in a nursing home, or have chronic disease.  There is no medicine to treat COVID-19. Your health care provider will talk with you about ways to treat your symptoms.    Take steps to protect yourself and others from infection. Wash your hands often and disinfect objects and surfaces that are frequently touched every day. Stay away from people who are sick and wear a mask if you are sick. This information is not intended to replace advice given to you by your health care provider. Make sure you discuss any questions you have with your health care provider. Document Revised: 09/26/2019 Document Reviewed: 01/02/2019 Elsevier Patient Education  2020 [employer] Inc. What types of side effects do monoclonal antibody drugs cause?  Common side effects  In general, the more common side effects caused by monoclonal antibody drugs include: . Allergic reactions, such as hives or itching . Flu-like signs and symptoms, including chills, fatigue, fever, and muscle aches and pains . Nausea, vomiting . Diarrhea . Skin rashes . Low blood pressure   The CDC is recommending patients who receive monoclonal antibody treatments wait at least 90 days before being vaccinated.  Currently, there are no data on the safety and efficacy of mRNA COVID-19 vaccines in persons who received monoclonal antibodies or convalescent plasma as part of COVID-19 treatment. Based  on the estimated half-life of such therapies as well as evidence suggesting that reinfection is uncommon in the 90 days after initial infection, vaccination should be deferred for at least 90 days, as a precautionary measure until additional information becomes available, to avoid interference of the antibody treatment with vaccine-induced immune responses. 

## 2020-10-16 NOTE — Progress Notes (Signed)
VS assessed at 1450 by NT- please see 1450 column

## 2020-10-16 NOTE — Progress Notes (Signed)
  Diagnosis: COVID-19  Physician: Dr. Asencion Noble  Procedure:  Allergies reviewed.  Sotrovimab administered via IV infusion after med fact sheet provided to patient and all questions answered. Discharge instructions provided to patient; all questions answered.  Complications: No immediate complications noted.  Discharge: Discharged home   Monna Fam 10/16/2020

## 2020-10-20 ENCOUNTER — Other Ambulatory Visit: Payer: Self-pay | Admitting: Family

## 2020-10-20 ENCOUNTER — Telehealth (INDEPENDENT_AMBULATORY_CARE_PROVIDER_SITE_OTHER): Payer: 59 | Admitting: Family

## 2020-10-20 ENCOUNTER — Telehealth: Payer: Self-pay | Admitting: Family

## 2020-10-20 ENCOUNTER — Other Ambulatory Visit: Payer: Self-pay

## 2020-10-20 ENCOUNTER — Encounter: Payer: Self-pay | Admitting: Family

## 2020-10-20 VITALS — BP 110/70 | Ht 67.0 in | Wt 245.0 lb

## 2020-10-20 DIAGNOSIS — Z6837 Body mass index (BMI) 37.0-37.9, adult: Secondary | ICD-10-CM

## 2020-10-20 DIAGNOSIS — F3289 Other specified depressive episodes: Secondary | ICD-10-CM | POA: Diagnosis not present

## 2020-10-20 DIAGNOSIS — E669 Obesity, unspecified: Secondary | ICD-10-CM

## 2020-10-20 DIAGNOSIS — M549 Dorsalgia, unspecified: Secondary | ICD-10-CM | POA: Insufficient documentation

## 2020-10-20 DIAGNOSIS — M5441 Lumbago with sciatica, right side: Secondary | ICD-10-CM

## 2020-10-20 MED ORDER — DULOXETINE HCL 30 MG PO CPEP: ORAL_CAPSULE | ORAL | 3 refills | Status: DC

## 2020-10-20 MED ORDER — SAXENDA 18 MG/3ML ~~LOC~~ SOPN: 0.6000 mg | PEN_INJECTOR | Freq: Every day | SUBCUTANEOUS | 3 refills | Status: DC

## 2020-10-20 MED FILL — SAXENDA 18 MG/3 ML PEN: 18 | 90 days supply | Qty: 9 | Fill #0

## 2020-10-20 MED FILL — DULoxetine HCL 30 MG CPEP: 30 | 30 days supply | Qty: 53 | Fill #0

## 2020-10-20 NOTE — Progress Notes (Signed)
Virtual Visit via Video Note  I connected with@  on 10/20/20 at  8:00 AM EST by a video enabled telemedicine application and verified that I am speaking with the correct person using two identifiers.  Location patient: home Location provider:work  Persons participating in the virtual visit: patient, provider  I discussed the limitations of evaluation and management by telemedicine and the availability of in person appointments. The patient expressed understanding and agreed to proceed.   HPI:  CC of grief after loosing both her mother and aunt whom she was very close to in the past 6 months. Depression comes and goes. No anxiety.  Has best friend whom she confides in as well as family.   Complains of chronic low back pain, usually exacerbated with physical activity such as extensive cleaning. Describes right pain running down leg to ankle, 'almost a feeling of heat' and numbness. Recently seen at urgent care and ED last month and given prednisone with resolution of leg pain. No leg swelling, h/o cancer, saddle anesthesia.   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   Currently covid positive, diagnosed 6 days ago. Started with son. Fully vaccinated. Had monoclonal antibodies 5 days ago which 'helped tremendously'. No fever, sob, cp.   Follows with psychiatry , Dr Toy Care. Has been several antidepressants and thinks lexapro was the best. She tried cymbalta in the past. She didn't care for wellbutrin. No si/hi.  Frustrated by weight gain as has gained weight and thinks exacerbated low back pain. Weight gain contributes to depression. No family or personal h/o thyroid cancer or parathyroid cancer. Lost approx 15 lbs on saxenda 3mg .    ROS: See pertinent positives and negatives per HPI.    EXAM:  VITALS per patient if applicable: BP 353/61   Ht 5\' 7"  (1.702 m)   Wt 245 lb (111.1 kg)   BMI 38.37 kg/m  BP Readings from Last 3 Encounters:  10/20/20 110/70   10/16/20 120/67  10/03/20 (!) 121/93   Wt Readings from Last 3 Encounters:  10/20/20 245 lb (111.1 kg)  10/02/20 238 lb 1.6 oz (108 kg)  09/19/20 238 lb (108 kg)    GENERAL: alert, oriented, appears well and in no acute distress  HEENT: atraumatic, conjunttiva clear, no obvious abnormalities on inspection of external nose and ears  NECK: normal movements of the head and neck  LUNGS: on inspection no signs of respiratory distress, breathing rate appears normal, no obvious gross SOB, gasping or wheezing  CV: no obvious cyanosis  MS: moves all visible extremities without noticeable abnormality  PSYCH/NEURO: pleasant and cooperative, no obvious depression or anxiety, speech and thought processing grossly intact  ASSESSMENT AND PLAN:  Discussed the following assessment and plan:  Problem List Items Addressed This Visit      Other   Back pain    Resolved at this time. Trial cymbalta. Advised to have MRI lumbar if symptoms recur.       Class 2 obesity without serious comorbidity with body mass index (BMI) of 37.0 to 37.9 in adult    Restart saxenda 0.6mg .       Relevant Medications   Liraglutide -Weight Management (SAXENDA) 18 MG/3ML SOPN   Depression - Primary    Grief reaction, compounded by weight gain. Start cymbalta 30mg  and she may start saxenda in 2-3 weeks. Close follow up      Relevant Medications   DULoxetine (CYMBALTA) 30 MG capsule   Other Relevant Orders  Ambulatory referral to Psychology      -we discussed possible serious and likely etiologies, options for evaluation and workup, limitations of telemedicine visit vs in person visit, treatment, treatment risks and precautions. Pt prefers to treat via telemedicine empirically rather then risking or undertaking an in person visit at this moment.  .   I discussed the assessment and treatment plan with the patient. The patient was provided an opportunity to ask questions and all were answered. The patient  agreed with the plan and demonstrated an understanding of the instructions.   The patient was advised to call back or seek an in-person evaluation if the symptoms worsen or if the condition fails to improve as anticipated.   Mable Paris, FNP

## 2020-10-20 NOTE — Patient Instructions (Addendum)
Referral for counseling Let us know if you dont hear back within a week in regards to an appointment being scheduled.   Start cymbalta  After couple of weeks may start saxenda  Trial of Saxenda. Please read information on medication below and remember black box warning that you may not take if you or a family member is diagnosed with thyroid cancer.    You may increase by 0.6 mg/day once a week. For instance, first week, take 0.6mg  [place] daily. Second week take 1.2 mg [place] daily. Third week take 1.8 mg [place] daily...   Max is 3 mg/day.    Remember the goal of weight loss is 1 to 2 pounds maximum per week. Its VERY reasonable to stay on a dose for a couple of weeks ( or more) prior to increasing. We dont want to increase too fast.    Also, if Kirke Shaggy is not approved, as discussed I will prescribe Victoza ( which is same medication, different brand name and used for diabetics)  Good luck!    Liraglutide ( SAXENDA) injection (Weight Management) What is this medicine? LIRAGLUTIDE (LIR a GLOO tide) is used to help people lose weight and maintain weight loss. It is used with a reduced-calorie diet and exercise. This medicine may be used for other purposes; ask your health care provider or pharmacist if you have questions. COMMON BRAND NAME(S): Saxenda What should I tell my health care provider before I take this medicine? They need to know if you have any of these conditions:  endocrine tumors (MEN 2) or if someone in your family had these tumors  gallbladder disease  high cholesterol  history of alcohol abuse problem  history of pancreatitis  kidney disease or if you are on dialysis  liver disease  previous swelling of the tongue, face, or lips with difficulty breathing, difficulty swallowing, hoarseness, or tightening of the throat  stomach problems  suicidal thoughts, plans, or attempt; a previous suicide attempt by you or a family member  thyroid cancer or if someone in your  family had thyroid cancer  an unusual or allergic reaction to liraglutide, other medicines, foods, dyes, or preservatives  pregnant or trying to get pregnant  breast-feeding How should I use this medicine? This medicine is for injection under the skin of your upper leg, stomach area, or upper arm. You will be taught how to prepare and give this medicine. Use exactly as directed. Take your medicine at regular intervals. Do not take it more often than directed. This drug comes with INSTRUCTIONS FOR USE. Ask your pharmacist for directions on how to use this drug. Read the information carefully. Talk to your pharmacist or health care provider if you have questions. It is important that you put your used needles and syringes in a special sharps container. Do not put them in a trash can. If you do not have a sharps container, call your pharmacist or healthcare provider to get one. A special MedGuide will be given to you by the pharmacist with each prescription and refill. Be sure to read this information carefully each time. Talk to your pediatrician regarding the use of this medicine in children. Special care may be needed. Overdosage: If you think you have taken too much of this medicine contact a poison control center or emergency room at once. NOTE: This medicine is only for you. Do not share this medicine with others. What if I miss a dose? If you miss a dose, take it as soon as  you can. If it is almost time for your next dose, take only that dose. Do not take double or extra doses. If you miss your dose for 3 days or more, call your doctor or health care professional to talk about how to restart this medicine. What may interact with this medicine?  insulin and other medicines for diabetes This list may not describe all possible interactions. Give your health care provider a list of all the medicines, herbs, non-prescription drugs, or dietary supplements you use. Also tell them if you smoke, drink  alcohol, or use illegal drugs. Some items may interact with your medicine. What should I watch for while using this medicine? Visit your doctor or health care professional for regular checks on your progress. Drink plenty of fluids while taking this medicine. Check with your doctor or health care professional if you get an attack of severe diarrhea, nausea, and vomiting. The loss of too much body fluid can make it dangerous for you to take this medicine. This medicine may affect blood sugar levels. Ask your healthcare provider if changes in diet or medicines are needed if you have diabetes. Patients and their families should watch out for worsening depression or thoughts of suicide. Also watch out for sudden changes in feelings such as feeling anxious, agitated, panicky, irritable, hostile, aggressive, impulsive, severely restless, overly excited and hyperactive, or not being able to sleep. If this happens, especially at the beginning of treatment or after a change in dose, call your health care professional. Women should inform their health care provider if they wish to become pregnant or think they might be pregnant. Losing weight while pregnant is not advised and may cause harm to the unborn child. Talk to your health care provider for more information. What side effects may I notice from receiving this medicine? Side effects that you should report to your doctor or health care professional as soon as possible:  allergic reactions like skin rash, itching or hives, swelling of the face, lips, or tongue  breathing problems  diarrhea that continues or is severe  lump or swelling on the neck  severe nausea  signs and symptoms of infection like fever or chills; cough; sore throat; pain or trouble passing urine  signs and symptoms of low blood sugar such as feeling anxious; confusion; dizziness; increased hunger; unusually weak or tired; increased sweating; shakiness; cold, clammy skin; irritable;  headache; blurred vision; fast heartbeat; loss of consciousness  signs and symptoms of kidney injury like trouble passing urine or change in the amount of urine  trouble swallowing  unusual stomach upset or pain  vomiting Side effects that usually do not require medical attention (report to your doctor or health care professional if they continue or are bothersome):  constipation  decreased appetite  diarrhea  fatigue  headache  nausea  pain, redness, or irritation at site where injected  stomach upset  stuffy or runny nose This list may not describe all possible side effects. Call your doctor for medical advice about side effects. You may report side effects to FDA at 1-800-FDA-1088. Where should I keep my medicine? Keep out of the reach of children. Store unopened pen in a refrigerator between 2 and 8 degrees C (36 and 46 degrees F). Do not freeze or use if the medicine has been frozen. Protect from light and excessive heat. After you first use the pen, it can be stored at room temperature between 15 and 30 degrees C (59 and 86 degrees F)  or in a refrigerator. Throw away your used pen after 30 days or after the expiration date, whichever comes first. Do not store your pen with the needle attached. If the needle is left on, medicine may leak from the pen. NOTE: This sheet is a summary. It may not cover all possible information. If you have questions about this medicine, talk to your doctor, pharmacist, or health care provider.  2020 Elsevier/Gold Standard (2019-10-02 21:16:59)   Purchase pulse oximeter to monitor oxygenation saturation. If you oxygen were to drop < 90%, this is an emergency and you may need oxygen support. You would need to seek in person evaluation at the emergency room or call 911.   Please start Mucinex DM to take at bedtime for cough.     Please begin Vit C 1000 mg daily   Vitamin D3 4000 IU daily   Zinc 50 mg daily   Quercetin 250 mg -500 mg  twice daily ; this an antioxidant which reduces inflammation. You can find it at places such as [place] and [employer] however not limited to these stores.   Once you are better, you may STOP all the above supplements.   Rest, hydrate very well, eat healthy protein food, Tylenol or Advil as directed .    Please go to Acute Care if symptoms worsen but are not an emergency. Office video visit again early next week.    Remain in self quarantine until at least 10 days since symptom onset AND 3 consecutive days fever free without antipyretics AND improvement in respiratory symptoms.    Utilize over the counter medications to treat symptoms.   Seek  treatment in the ED if respiratory issues/distress develops or unrelieved chest pain. Or, if you become severely weak, dehydrated, confused.    Only leave home to seek medical care and must wear a mask in public. Limit contact with family members or caregivers in the home and to notify her family who she was with yesterday that they should quarantine for 14 days. Practice social distancing and to continue to use good preventative care measures such has frequent hand washing.      Your COVID-19 test was resulted as positive.    You should continue your self imposed quantarine until you can answer "yes" to ALL 3 of the conditions below:   1) Your symptoms (fever, cough, shortness of breath) started 7 or more days ago   2) Your body temperature  has been normal for at least 72 hours (WITHOUT the use of any tylenol, motrin or aleve) . Normal is < 100.4 Farenheit  3) your other flu  like symptoms symptoms are getting better.   YOUR FAMILY or other household contacts, however , even if they feel fine , will need to continue to quarantining themselves  (that means no contact with ANYONE outside of the house)  for 14 days STARTING from the end of your initial 7 day period . (why? because they have been theoretically exposed to you during the entire 7 days of  your illness, and they can sheD the virus without symptoms for that period of time ).         10 Things You Can Do to Manage Your COVID-19 Symptoms at Home If you have possible or confirmed COVID-19: 1. Stay home from work and school. And stay away from other public places. If you must go out, avoid using any kind of public transportation, ridesharing, or taxis. 2. Monitor your symptoms carefully. If  your symptoms get worse, call your healthcare provider immediately. 3. Get rest and stay hydrated. 4. If you have a medical appointment, call the healthcare provider ahead of time and tell them that you have or may have COVID-19. 5. For medical emergencies, call 911 and notify the dispatch personnel that you have or may have COVID-19. 6. Cover your cough and sneezes with a tissue or use the inside of your elbow. 7. Wash your hands often with soap and water for at least 20 seconds or clean your hands with an alcohol-based hand sanitizer that contains at least 60% alcohol. 8. As much as possible, stay in a specific room and away from other people in your home. Also, you should use a separate bathroom, if available. If you need to be around other people in or outside of the home, wear a mask. 9. Avoid sharing personal items with other people in your household, like dishes, towels, and bedding. 10. Clean all surfaces that are touched often, like counters, tabletops, and doorknobs. Use household cleaning sprays or wipes according to the label instructions. [email or web address] 06/11/2019

## 2020-10-20 NOTE — Telephone Encounter (Signed)
[facility] called in need the prescription for Liraglutide -Weight Management (SAXENDA) 18 MG/3ML SOPN to be for a whole box

## 2020-10-20 NOTE — Telephone Encounter (Signed)
I called [facility] & they were able to fill [place] for whole box so that patient could use coupon.

## 2020-10-20 NOTE — Assessment & Plan Note (Signed)
Restart saxenda 0.6mg .

## 2020-10-20 NOTE — Assessment & Plan Note (Signed)
Resolved at this time. Trial cymbalta. Advised to have MRI lumbar if symptoms recur.

## 2020-10-20 NOTE — Assessment & Plan Note (Signed)
Grief reaction, compounded by weight gain. Start cymbalta 30mg  and she may start saxenda in 2-3 weeks. Close follow up

## 2020-10-25 ENCOUNTER — Encounter: Payer: 59 | Admitting: Family

## 2020-11-21 ENCOUNTER — Encounter: Payer: Self-pay | Admitting: Family

## 2020-11-23 ENCOUNTER — Other Ambulatory Visit: Payer: Self-pay | Admitting: Family

## 2020-11-23 DIAGNOSIS — F3289 Other specified depressive episodes: Secondary | ICD-10-CM

## 2020-11-29 ENCOUNTER — Other Ambulatory Visit: Payer: Self-pay | Admitting: Family

## 2020-11-29 DIAGNOSIS — Z6837 Body mass index (BMI) 37.0-37.9, adult: Secondary | ICD-10-CM

## 2020-11-29 MED FILL — DULoxetine HCL 30 MG CPEP: 30 | 30 days supply | Qty: 60 | Fill #1

## 2020-12-02 ENCOUNTER — Other Ambulatory Visit: Payer: Self-pay

## 2020-12-02 ENCOUNTER — Other Ambulatory Visit: Payer: Self-pay | Admitting: Family

## 2020-12-02 ENCOUNTER — Telehealth: Payer: Self-pay

## 2020-12-02 DIAGNOSIS — E669 Obesity, unspecified: Secondary | ICD-10-CM

## 2020-12-02 MED ORDER — SAXENDA 18 MG/3ML ~~LOC~~ SOPN: PEN_INJECTOR | SUBCUTANEOUS | 3 refills | Status: DC

## 2020-12-02 MED FILL — SAXENDA 18 MG/3 ML PEN: 18 | 30 days supply | Qty: 15 | Fill #0

## 2020-12-02 NOTE — Telephone Encounter (Signed)
I called patient to let her know that PA was approved for Saxenda 12/01/20-12/01/21. She let me know that issue actually was increased dose & prescription was not written for that. I have corrected & sent. I will await to see if new PA is needed.

## 2020-12-14 ENCOUNTER — Ambulatory Visit: Payer: 59 | Admitting: Psychology

## 2020-12-20 ENCOUNTER — Ambulatory Visit (INDEPENDENT_AMBULATORY_CARE_PROVIDER_SITE_OTHER): Payer: 59 | Admitting: Psychology

## 2020-12-20 DIAGNOSIS — F4323 Adjustment disorder with mixed anxiety and depressed mood: Secondary | ICD-10-CM

## 2020-12-22 ENCOUNTER — Encounter: Payer: Self-pay | Admitting: Family

## 2020-12-22 ENCOUNTER — Ambulatory Visit: Payer: 59 | Admitting: Family

## 2020-12-22 ENCOUNTER — Other Ambulatory Visit: Payer: Self-pay | Admitting: Family

## 2020-12-22 ENCOUNTER — Other Ambulatory Visit: Payer: Self-pay

## 2020-12-22 DIAGNOSIS — F3289 Other specified depressive episodes: Secondary | ICD-10-CM | POA: Diagnosis not present

## 2020-12-22 DIAGNOSIS — Z6837 Body mass index (BMI) 37.0-37.9, adult: Secondary | ICD-10-CM | POA: Diagnosis not present

## 2020-12-22 DIAGNOSIS — E669 Obesity, unspecified: Secondary | ICD-10-CM

## 2020-12-22 MED ORDER — DULOXETINE HCL 60 MG PO CPEP: 60.0000 mg | ORAL_CAPSULE | Freq: Every day | ORAL | 1 refills | Status: DC

## 2020-12-22 MED FILL — DULOXETINE HCL 60 MG CPEP: 60 | 90 days supply | Qty: 90 | Fill #0

## 2020-12-22 NOTE — Patient Instructions (Addendum)
Nice to see you!  This is  Dr. Tullo's  example of a  "Low GI"  Diet:  It will allow you to lose 4 to 8  lbs  per month if you follow it carefully.  Your goal with exercise is a minimum of 30 minutes of aerobic exercise 5 days per week (Walking does not count once it becomes easy!)    All of the foods can be found at grocery stores and in bulk at [employer].  The Atkins protein bars and shakes are available in more varieties at [employer], [employer] and [employer].     7 AM Breakfast:  Choose from the following:  Low carbohydrate Protein  Shakes (I recommend the  Premier Protein chocolate shakes,  EAS AdvantEdge "Carb Control" shakes  Or the Atkins shakes all are under 3 net carbs)     a scrambled egg/bacon/cheese burrito made with [place]'s "carb balance" whole wheat tortilla  (about 10 net carbs )  [employer] sells microwaveable frittata (basically a quiche without the pastry crust) that is eaten cold and very convenient way to get your eggs.  8 carbs)  If you make your own protein shakes, avoid bananas and pineapple,  And use low carb greek yogurt or original /unsweetened almond or soy milk    Avoid cereal and bananas, oatmeal and cream of wheat and grits. They are loaded with carbohydrates!   10 AM: high protein snack:  Protein bar by Atkins (the snack size, under 200 cal, usually < 6 net carbs).    A stick of cheese:  Around 1 carb,  100 cal     Dannon Light n Fit Greek Yogurt  (80 cal, 8 carbs)  Other so called "protein bars" and Greek yogurts tend to be loaded with carbohydrates.  Remember, in food advertising, the word "energy" is synonymous for " carbohydrate."  Lunch:   A Sandwich using the bread choices listed, Can use any  Eggs,  lunchmeat, grilled meat or canned tuna), avocado, regular mayo/mustard  and cheese.  A Salad using blue cheese, ranch,  Goddess or vinagrette,  Avoid taco shells, croutons or "confetti" and no "candied nuts" but regular nuts OK.   No pretzels, nabs  or  chips.  Pickles and miniature sweet peppers are a good low carb alternative that provide a "crunch"  The bread is the only source of carbohydrate in a sandwich and  can be decreased by trying some of the attached alternatives to traditional loaf bread   Avoid "Low fat dressings, as well as [place] and [place] dressings They are loaded with sugar!   3 PM/ Mid day  Snack:  Consider  1 ounce of  almonds, walnuts, pistachios, pecans, peanuts,  Macadamia nuts or a nut medley.  Avoid "granola and granola bars "  Mixed nuts are ok in moderation as long as there are no raisins,  cranberries or dried fruit.   KIND bars are OK if you get the low glycemic index variety   Try the prosciutto/mozzarella cheese sticks by [employer]  In deli /backery section   High protein      6 PM  Dinner:     Meat/fowl/fish with a green salad, and either broccoli, cauliflower, green beans, spinach, brussel sprouts or  Lima beans. DO NOT BREAD THE PROTEIN!!      There is a low carb pasta by Dreamfield's that is acceptable and tastes great: only 5 digestible carbs/serving.( All grocery stores but BJs carry it )   Several ready made meals are available low carb:   Try Michel Angelo's chicken piccata or chicken or eggplant parm over low carb pasta.(Lowes and BJs)   Aaron Sanchez's "Carnitas" (pulled pork, no sauce,  0 carbs) or his beef pot roast to make a dinner burrito (at [employer]J's)  Pesto over low carb pasta (bj's sells a good quality pesto in the center refrigerated section of the deli   Try satueeing  Bok Choy with mushroooms as a good side   [employer] Giant makes a mashed cauliflower that tastes like mashed potatoes  Whole wheat pasta is still full of digestible carbs and  Not as low in glycemic index as Dreamfield's.   Brown rice is still rice,  So skip the rice and noodles if you eat Chinese or Thai (or at least limit to 1/2 cup)  9 PM snack :   Breyer's "low carb" fudgsicle or  ice cream bar (Carb Smart line), or   Weight Watcher's ice cream bar , or another "no sugar added" ice cream;  a serving of fresh berries/cherries with whipped cream   Cheese or DANNON'S LlGHT N FIT GREEK YOGURT  8 ounces of [place] unsweetened almond/cococunut milk    Treat yourself to a parfait made with whipped cream blueberiies, walnuts and vanilla greek yogurt  Avoid bananas, pineapple, grapes  and watermelon on a regular basis because they are high in sugar.  THINK OF THEM AS DESSERT  Remember that snack Substitutions should be less than 10 NET carbs per serving and meals < 20 carbs. Remember to subtract fiber grams to get the "net carbs."    

## 2020-12-22 NOTE — Progress Notes (Signed)
Subjective:    Patient ID: Zuha Dejonge, female    DOB: 1971/12/06, 50 y.o.   MRN: [id number]  CC: Denean Pavon is a 50 y.o. female who presents today for follow up.   HPI: Feels well today No new concerns.   Compliant with saxenda 1.2mg  and has helped binge eating. Has lost 6 lbs. Pleased with medication.   Depression  has improved. She has been taking cymbalta 30mg . She hasnt increased dose. Planning to do counseling every 2 weeks. Has had one session so far.      HISTORY:  Past Medical History:  Diagnosis Date  . ADD (attention deficit disorder)   . ANA positive   . Antiphospholipid antibody syndrome ([place]) 11/15/2012  . Antiphospholipid antibody syndrome complicating pregnancy ([place])   . Back pain   . Breast fibroadenoma    history of  . Clotting disorder ([place])    with pregancy  . Depression   . Dysplasia of cervix, low grade (CIN 1) [id number]  . Fatigue   . Fibromyalgia   . GERD (gastroesophageal reflux disease)   . H/O varicella   . Hx of blood clots    During pregnancy  . Joint pain   . Raynaud's disease   . Rosacea   . Vitamin D deficiency    Past Surgical History:  Procedure Laterality Date  . CESAREAN SECTION    . GYNECOLOGIC CRYOSURGERY     of cervix  . right breast fibroadenoma removed     Family History  Problem Relation Age of Onset  . Hypertension Mother   . Stroke Mother   . Heart attack Mother   . Diabetes Mother   . Hyperlipidemia Mother   . Heart disease Mother   . Depression Mother   . Anxiety disorder Mother   . Schizophrenia Mother   . Obesity Mother   . Mental illness Brother        schizophrenia  . Colon cancer Neg Hx   . Esophageal cancer Neg Hx   . Rectal cancer Neg Hx   . Stomach cancer Neg Hx   . Pancreatic cancer Neg Hx   . Thyroid cancer Neg Hx     Allergies: Patient has no known allergies. Current Outpatient Medications on File Prior to Visit  Medication Sig Dispense Refill  .  amphetamine-dextroamphetamine (ADDERALL) 20 MG tablet   0  . Insulin Pen Needle (PEN NEEDLES) 31G X 6 MM MISC Use one pen needle daily. 100 each 1  . Liraglutide -Weight Management (SAXENDA) 18 MG/3ML SOPN Take 3.0 MG into the skin daily. 15 mL 3   Current Facility-Administered Medications on File Prior to Visit  Medication Dose Route Frequency Provider Last Rate Last Admin  . 0.9 %  sodium chloride infusion  500 mL Intravenous Continuous Pyrtle, Lajuan Lines, MD        Social History   Tobacco Use  . Smoking status: Never Smoker  . Smokeless tobacco: Never Used  Vaping Use  . Vaping Use: Never used  Substance Use Topics  . Alcohol use: No    Alcohol/week: 1.0 standard drink    Types: 1 Glasses of wine per week    Comment: none   . Drug use: No    Review of Systems  Constitutional: Negative for chills and fever.  Respiratory: Negative for cough.   Cardiovascular: Negative for chest pain and palpitations.  Gastrointestinal: Negative for nausea and vomiting.  Psychiatric/Behavioral: The patient is not nervous/anxious.  Objective:    BP 118/72   Pulse (!) 104   Temp 98.1 F (36.7 C)   Ht 5\' 7"  (1.702 m)   Wt 252 lb 6.4 oz (114.5 kg)   SpO2 99%   BMI 39.53 kg/m  BP Readings from Last 3 Encounters:  12/22/20 118/72  10/20/20 110/70  10/16/20 120/67   Wt Readings from Last 3 Encounters:  12/22/20 252 lb 6.4 oz (114.5 kg)  10/20/20 245 lb (111.1 kg)  10/02/20 238 lb 1.6 oz (108 kg)    Physical Exam Vitals reviewed.  Constitutional:      Appearance: She is well-developed and well-nourished.  Eyes:     Conjunctiva/sclera: Conjunctivae normal.  Cardiovascular:     Rate and Rhythm: Normal rate and regular rhythm.     Pulses: Normal pulses.     Heart sounds: Normal heart sounds.  Pulmonary:     Effort: Pulmonary effort is normal.     Breath sounds: Normal breath sounds. No wheezing, rhonchi or rales.  Skin:    General: Skin is warm and dry.  Neurological:      Mental Status: She is alert.  Psychiatric:        Mood and Affect: Mood and affect normal.        Speech: Speech normal.        Behavior: Behavior normal.        Thought Content: Thought content normal.        Assessment & Plan:   Problem List Items Addressed This Visit      Other   Class 2 obesity without serious comorbidity with body mass index (BMI) of 37.0 to 37.9 in adult    Congratulated patient on weight loss. Advised to continue saxenda 1.2mg  and slowly increase until weight loss sustained at 1-2 lb/week.       Depression    Improved. Agree to increase cymbalta for likely more ineffectiveness. Increase cymbalta 60mg .       Relevant Medications   DULoxetine (CYMBALTA) 60 MG capsule       I have changed Mikaelah B. Donner's DULoxetine. I am also having her maintain her amphetamine-dextroamphetamine, Pen Needles, and Saxenda. We will continue to administer sodium chloride.   Meds ordered this encounter  Medications  . DULoxetine (CYMBALTA) 60 MG capsule    Sig: Take 1 capsule (60 mg total) by mouth daily.    Dispense:  90 capsule    Refill:  1    Order Specific Question:   Supervising Provider    Answer:   Crecencio Mc [[id number]]    Return precautions given.   Risks, benefits, and alternatives of the medications and treatment plan prescribed today were discussed, and patient expressed understanding.   Education regarding symptom management and diagnosis given to patient on AVS.  Continue to follow with Burnard Hawthorne, FNP for routine health maintenance.   [name] and I agreed with plan.   Mable Paris, FNP

## 2020-12-24 NOTE — Assessment & Plan Note (Signed)
Congratulated patient on weight loss. Advised to continue saxenda 1.2mg  and slowly increase until weight loss sustained at 1-2 lb/week.

## 2020-12-24 NOTE — Assessment & Plan Note (Signed)
Improved. Agree to increase cymbalta for likely more ineffectiveness. Increase cymbalta 60mg .

## 2021-01-06 ENCOUNTER — Other Ambulatory Visit: Payer: Self-pay | Admitting: Family

## 2021-01-06 DIAGNOSIS — Z1231 Encounter for screening mammogram for malignant neoplasm of breast: Secondary | ICD-10-CM

## 2021-01-07 ENCOUNTER — Ambulatory Visit (INDEPENDENT_AMBULATORY_CARE_PROVIDER_SITE_OTHER): Payer: 59 | Admitting: Psychology

## 2021-01-07 DIAGNOSIS — F4323 Adjustment disorder with mixed anxiety and depressed mood: Secondary | ICD-10-CM

## 2021-01-11 ENCOUNTER — Other Ambulatory Visit (HOSPITAL_COMMUNITY): Payer: Self-pay | Admitting: Specialist

## 2021-01-11 MED FILL — AMPHETAMINE-DEXTROAMPHETAMI: 20 | 90 days supply | Qty: 360 | Fill #0

## 2021-01-14 ENCOUNTER — Ambulatory Visit (INDEPENDENT_AMBULATORY_CARE_PROVIDER_SITE_OTHER): Payer: 59 | Admitting: Psychology

## 2021-01-14 DIAGNOSIS — F4323 Adjustment disorder with mixed anxiety and depressed mood: Secondary | ICD-10-CM | POA: Diagnosis not present

## 2021-01-17 ENCOUNTER — Ambulatory Visit
Admission: RE | Admit: 2021-01-17 | Discharge: 2021-01-17 | Disposition: A | Discharge status: Home / Self Care | Payer: 59 | Source: Ambulatory Visit

## 2021-01-17 ENCOUNTER — Other Ambulatory Visit: Payer: Self-pay

## 2021-01-17 DIAGNOSIS — Z1231 Encounter for screening mammogram for malignant neoplasm of breast: Secondary | ICD-10-CM

## 2021-01-19 ENCOUNTER — Other Ambulatory Visit: Payer: Self-pay | Admitting: Family

## 2021-01-19 DIAGNOSIS — R928 Other abnormal and inconclusive findings on diagnostic imaging of breast: Secondary | ICD-10-CM

## 2021-01-21 ENCOUNTER — Ambulatory Visit: Payer: 59 | Admitting: Psychology

## 2021-01-26 ENCOUNTER — Ambulatory Visit: Payer: Self-pay | Admitting: Psychology

## 2021-01-26 MED FILL — SAXENDA 18 MG/3 ML PEN: 18 | 30 days supply | Qty: 15 | Fill #1

## 2021-01-26 MED FILL — DULOXETINE HCL 60 MG CPEP: 60 | 90 days supply | Qty: 90 | Fill #0

## 2021-02-02 ENCOUNTER — Ambulatory Visit
Admission: RE | Admit: 2021-02-02 | Discharge: 2021-02-02 | Disposition: A | Discharge status: Home / Self Care | Payer: 59 | Source: Ambulatory Visit | Attending: Family | Admitting: Family

## 2021-02-02 ENCOUNTER — Other Ambulatory Visit: Payer: Self-pay

## 2021-02-02 ENCOUNTER — Ambulatory Visit: Payer: 59

## 2021-02-02 DIAGNOSIS — R928 Other abnormal and inconclusive findings on diagnostic imaging of breast: Secondary | ICD-10-CM

## 2021-02-04 ENCOUNTER — Ambulatory Visit (INDEPENDENT_AMBULATORY_CARE_PROVIDER_SITE_OTHER): Payer: 59 | Admitting: Psychology

## 2021-02-04 DIAGNOSIS — F4323 Adjustment disorder with mixed anxiety and depressed mood: Secondary | ICD-10-CM

## 2021-02-11 ENCOUNTER — Ambulatory Visit (INDEPENDENT_AMBULATORY_CARE_PROVIDER_SITE_OTHER): Payer: 59 | Admitting: Psychology

## 2021-02-11 DIAGNOSIS — F4323 Adjustment disorder with mixed anxiety and depressed mood: Secondary | ICD-10-CM

## 2021-02-18 ENCOUNTER — Ambulatory Visit (INDEPENDENT_AMBULATORY_CARE_PROVIDER_SITE_OTHER): Payer: 59 | Admitting: Psychology

## 2021-02-18 DIAGNOSIS — F4323 Adjustment disorder with mixed anxiety and depressed mood: Secondary | ICD-10-CM | POA: Diagnosis not present

## 2021-02-21 ENCOUNTER — Encounter: Payer: Self-pay | Admitting: Family

## 2021-02-23 ENCOUNTER — Other Ambulatory Visit: Payer: Self-pay | Admitting: Family

## 2021-02-23 DIAGNOSIS — F3289 Other specified depressive episodes: Secondary | ICD-10-CM

## 2021-03-03 ENCOUNTER — Ambulatory Visit (INDEPENDENT_AMBULATORY_CARE_PROVIDER_SITE_OTHER): Payer: 59 | Admitting: Psychology

## 2021-03-03 DIAGNOSIS — F4323 Adjustment disorder with mixed anxiety and depressed mood: Secondary | ICD-10-CM

## 2021-03-04 ENCOUNTER — Ambulatory Visit: Payer: 59 | Admitting: Psychology

## 2021-03-10 ENCOUNTER — Other Ambulatory Visit (HOSPITAL_COMMUNITY): Payer: Self-pay | Admitting: Obstetrics and Gynecology

## 2021-03-10 DIAGNOSIS — N898 Other specified noninflammatory disorders of vagina: Secondary | ICD-10-CM | POA: Diagnosis not present

## 2021-03-10 DIAGNOSIS — N915 Oligomenorrhea, unspecified: Secondary | ICD-10-CM | POA: Diagnosis not present

## 2021-03-10 DIAGNOSIS — Z01411 Encounter for gynecological examination (general) (routine) with abnormal findings: Secondary | ICD-10-CM | POA: Diagnosis not present

## 2021-03-10 DIAGNOSIS — Z6839 Body mass index (BMI) 39.0-39.9, adult: Secondary | ICD-10-CM | POA: Diagnosis not present

## 2021-03-10 DIAGNOSIS — Z01419 Encounter for gynecological examination (general) (routine) without abnormal findings: Secondary | ICD-10-CM | POA: Diagnosis not present

## 2021-03-10 DIAGNOSIS — Z124 Encounter for screening for malignant neoplasm of cervix: Secondary | ICD-10-CM | POA: Diagnosis not present

## 2021-03-10 DIAGNOSIS — Z113 Encounter for screening for infections with a predominantly sexual mode of transmission: Secondary | ICD-10-CM | POA: Diagnosis not present

## 2021-03-12 ENCOUNTER — Other Ambulatory Visit (HOSPITAL_COMMUNITY): Payer: Self-pay

## 2021-03-12 MED FILL — Metronidazole Vaginal Gel 0.75%: VAGINAL | 5 days supply | Qty: 70 | Fill #0 | Status: AC

## 2021-03-12 MED FILL — Norethindrone Acetate Tab 5 MG: ORAL | 5 days supply | Qty: 10 | Fill #0 | Status: AC

## 2021-03-13 ENCOUNTER — Other Ambulatory Visit (HOSPITAL_COMMUNITY): Payer: Self-pay

## 2021-03-14 ENCOUNTER — Ambulatory Visit (INDEPENDENT_AMBULATORY_CARE_PROVIDER_SITE_OTHER): Payer: 59 | Admitting: Psychology

## 2021-03-14 ENCOUNTER — Other Ambulatory Visit (HOSPITAL_COMMUNITY): Payer: Self-pay

## 2021-03-14 DIAGNOSIS — F4323 Adjustment disorder with mixed anxiety and depressed mood: Secondary | ICD-10-CM

## 2021-03-18 ENCOUNTER — Ambulatory Visit: Payer: 59 | Admitting: Psychology

## 2021-03-19 ENCOUNTER — Other Ambulatory Visit (HOSPITAL_COMMUNITY): Payer: Self-pay

## 2021-03-19 MED FILL — Liraglutide (Weight Mngmt) Soln Pen-Inj 18 MG/3ML (6 MG/ML): SUBCUTANEOUS | 30 days supply | Qty: 15 | Fill #0 | Status: AC

## 2021-04-01 ENCOUNTER — Ambulatory Visit (INDEPENDENT_AMBULATORY_CARE_PROVIDER_SITE_OTHER): Payer: 59 | Admitting: Psychology

## 2021-04-01 DIAGNOSIS — F4323 Adjustment disorder with mixed anxiety and depressed mood: Secondary | ICD-10-CM

## 2021-04-15 ENCOUNTER — Ambulatory Visit (INDEPENDENT_AMBULATORY_CARE_PROVIDER_SITE_OTHER): Payer: 59 | Admitting: Psychology

## 2021-04-15 DIAGNOSIS — F4323 Adjustment disorder with mixed anxiety and depressed mood: Secondary | ICD-10-CM

## 2021-04-22 ENCOUNTER — Other Ambulatory Visit: Payer: Self-pay

## 2021-04-22 ENCOUNTER — Ambulatory Visit (INDEPENDENT_AMBULATORY_CARE_PROVIDER_SITE_OTHER): Payer: 59 | Admitting: Family

## 2021-04-22 ENCOUNTER — Other Ambulatory Visit (HOSPITAL_COMMUNITY): Payer: Self-pay

## 2021-04-22 ENCOUNTER — Encounter: Payer: Self-pay | Admitting: Family

## 2021-04-22 VITALS — BP 106/64 | HR 74 | Temp 97.6°F | Ht 67.0 in | Wt 254.4 lb

## 2021-04-22 DIAGNOSIS — E049 Nontoxic goiter, unspecified: Secondary | ICD-10-CM | POA: Diagnosis not present

## 2021-04-22 DIAGNOSIS — Z0000 Encounter for general adult medical examination without abnormal findings: Secondary | ICD-10-CM | POA: Diagnosis not present

## 2021-04-22 DIAGNOSIS — Z6837 Body mass index (BMI) 37.0-37.9, adult: Secondary | ICD-10-CM

## 2021-04-22 DIAGNOSIS — E669 Obesity, unspecified: Secondary | ICD-10-CM

## 2021-04-22 DIAGNOSIS — F3289 Other specified depressive episodes: Secondary | ICD-10-CM | POA: Diagnosis not present

## 2021-04-22 LAB — COMPREHENSIVE METABOLIC PANEL
ALT: 10 U/L (ref 0–35)
AST: 16 U/L (ref 0–37)
Albumin: 4.1 g/dL (ref 3.5–5.2)
Alkaline Phosphatase: 61 U/L (ref 39–117)
BUN: 13 mg/dL (ref 6–23)
CO2: 29 mEq/L (ref 19–32)
Calcium: 8.9 mg/dL (ref 8.4–10.5)
Chloride: 104 mEq/L (ref 96–112)
Creatinine, Ser: 0.73 mg/dL (ref 0.40–1.20)
GFR: 96.69 mL/min (ref >60.00)
Glucose, Bld: 80 mg/dL (ref 70–99)
Potassium: 4.1 mEq/L (ref 3.5–5.1)
Sodium: 140 mEq/L (ref 135–145)
Total Bilirubin: 1 mg/dL (ref 0.2–1.2)
Total Protein: 6.9 g/dL (ref 6.0–8.3)

## 2021-04-22 LAB — TSH: TSH: 2.75 u[IU]/mL (ref 0.35–4.50)

## 2021-04-22 LAB — CBC WITH DIFFERENTIAL/PLATELET
Basophils Absolute: 0.1 10*3/uL (ref 0.0–0.1)
Basophils Relative: 1.1 % (ref 0.0–3.0)
Eosinophils Absolute: 0.1 10*3/uL (ref 0.0–0.7)
Eosinophils Relative: 1.2 % (ref 0.0–5.0)
HCT: 39.2 % (ref 36.0–46.0)
Hemoglobin: 13.2 g/dL (ref 12.0–15.0)
Lymphocytes Relative: 22.5 % (ref 12.0–46.0)
Lymphs Abs: 1.5 10*3/uL (ref 0.7–4.0)
MCHC: 33.8 g/dL (ref 30.0–36.0)
MCV: 93.1 fl (ref 78.0–100.0)
Monocytes Absolute: 0.7 10*3/uL (ref 0.1–1.0)
Monocytes Relative: 10.4 % (ref 3.0–12.0)
Neutro Abs: 4.3 10*3/uL (ref 1.4–7.7)
Neutrophils Relative %: 64.8 % (ref 43.0–77.0)
Platelets: 221 10*3/uL (ref 150.0–400.0)
RBC: 4.21 Mil/uL (ref 3.87–5.11)
RDW: 14.8 % (ref 11.5–15.5)
WBC: 6.6 10*3/uL (ref 4.0–10.5)

## 2021-04-22 LAB — LIPID PANEL
Cholesterol: 210 mg/dL — ABNORMAL HIGH (ref 0–200)
HDL: 79.7 mg/dL (ref >39.00)
LDL Cholesterol: 115 mg/dL — ABNORMAL HIGH (ref 0–99)
NonHDL: 130.04
Total CHOL/HDL Ratio: 3
Triglycerides: 73 mg/dL (ref 0.0–149.0)
VLDL: 14.6 mg/dL (ref 0.0–40.0)

## 2021-04-22 LAB — VITAMIN D 25 HYDROXY (VIT D DEFICIENCY, FRACTURES): VITD: 21.1 ng/mL — ABNORMAL LOW (ref 30.00–100.00)

## 2021-04-22 LAB — B12 AND FOLATE PANEL
Folate: 6.3 ng/mL (ref >5.9)
Vitamin B-12: 332 pg/mL (ref 211–911)

## 2021-04-22 LAB — HEMOGLOBIN A1C: Hgb A1c MFr Bld: 5.4 % (ref 4.6–6.5)

## 2021-04-22 MED ORDER — METFORMIN HCL ER 500 MG PO TB24
ORAL_TABLET | ORAL | 3 refills | Status: DC
  Filled 2021-04-22: qty 90, 90d supply, fill #0
  Filled 2021-08-17 – 2021-09-03 (×2): qty 90, 90d supply, fill #1
  Filled 2021-11-19: qty 90, 90d supply, fill #2
  Filled 2022-02-03: qty 90, 90d supply, fill #3

## 2021-04-22 NOTE — Patient Instructions (Addendum)
Ultrasound of thyroid Let us know if you dont hear back within a week in regards to an appointment being scheduled.    Lets trial metformin  Start metformin XR with one 500mg  tablet at night. After one week, you may increase to two tablets at night ( total of 1000mg ) . The third week, you may take take two tablets at night and one tablet in the morning.  The fourth week, you may take two tablets in the morning ( 1000mg  total) and two tablets at night (1000mg  total). This will bring you to a maximum daily dose of 2000mg /day which is maximum dose. Along the way, if you want to increase more slowly, please do as this medication can cause GI discomfort and loose stools which usually get better with time , however some patients find that they can only tolerate a certain dose and cannot increase to maximum dose.  This is  Dr. Lupita Dawn  example of a  "Low GI"  Diet:  It will allow you to lose 4 to 8  lbs  per month if you follow it carefully.  Your goal with exercise is a minimum of 30 minutes of aerobic exercise 5 days per week (Walking does not count once it becomes easy!)    All of the foods can be found at grocery stores and in bulk at [employer].  The Atkins protein bars and shakes are available in more varieties at Target, [employer] and [employer].     7 AM Breakfast:  Choose from the following:  Low carbohydrate Protein  Shakes (I recommend the  Premier Protein chocolate shakes,  EAS AdvantEdge "Carb Control" shakes  Or the Atkins shakes all are under 3 net carbs)     a scrambled egg/bacon/cheese burrito made with Mission's "carb balance" whole wheat tortilla  (about 10 net carbs )  Regulatory affairs officer (basically a quiche without the pastry crust) that is eaten cold and very convenient way to get your eggs.  8 carbs)  If you make your own protein shakes, avoid bananas and pineapple,  And use low carb greek yogurt or original /unsweetened almond or soy milk    Avoid cereal and  bananas, oatmeal and cream of wheat and grits. They are loaded with carbohydrates!   10 AM: high protein snack:  Protein bar by Atkins (the snack size, under 200 cal, usually < 6 net carbs).    A stick of cheese:  Around 1 carb,  100 cal     Dannon Light n Fit [place] Yogurt  (80 cal, 8 carbs)  Other so called "protein bars" and Greek yogurts tend to be loaded with carbohydrates.  Remember, in food advertising, the word "energy" is synonymous for " carbohydrate."  Lunch:   A Sandwich using the bread choices listed, Can use any  Eggs,  lunchmeat, grilled meat or canned tuna), avocado, regular mayo/mustard  and cheese.  A Salad using blue cheese, ranch,  Goddess or vinagrette,  Avoid taco shells, croutons or "confetti" and no "candied nuts" but regular nuts OK.   No pretzels, nabs  or chips.  Pickles and miniature sweet peppers are a good low carb alternative that provide a "crunch"  The bread is the only source of carbohydrate in a sandwich and  can be decreased by trying some of the attached alternatives to traditional loaf bread   Avoid "Low fat dressings, as well as Barry Brunner and [employer] dressings They are loaded with sugar!  3 PM/ Mid day  Snack:  Consider  1 ounce of  almonds, walnuts, pistachios, pecans, peanuts,  Macadamia nuts or a nut medley.  Avoid "granola and granola bars "  Mixed nuts are ok in moderation as long as there are no raisins,  cranberries or dried fruit.   KIND bars are OK if you get the low glycemic index variety   Try the prosciutto/mozzarella cheese sticks by [employer]  In deli /backery section   High protein      6 PM  Dinner:     Meat/fowl/fish with a green salad, and either broccoli, cauliflower, green beans, spinach, brussel sprouts or  Lima beans. DO NOT BREAD THE PROTEIN!!      There is a low carb pasta by Dreamfield's that is acceptable and tastes great: only 5 digestible carbs/serving.( All grocery stores but BJs carry it ) Several ready made meals  are available low carb:   Try Michel Angelo's chicken piccata or chicken or eggplant parm over low carb pasta.(Lowes and BJs)   Marjory Lies Sanchez's "Carnitas" (pulled pork, no sauce,  0 carbs) or his beef pot roast to make a dinner burrito (at [employer]J's)  Pesto over low carb pasta (bj's sells a good quality pesto in the center refrigerated section of the deli   Try satueeing  Cheral Marker with mushroooms as a good side   [employer] Giant makes a mashed cauliflower that tastes like mashed potatoes  Whole wheat pasta is still full of digestible carbs and  Not as low in glycemic index as Dreamfield's.   Brown rice is still rice,  So skip the rice and noodles if you eat [place] or [place] (or at least limit to 1/2 cup)  9 PM snack :   Breyer's "low carb" fudgsicle or  ice cream bar (Carb Smart line), or  Weight Watcher's ice cream bar , or another "no sugar added" ice cream;  a serving of fresh berries/cherries with whipped cream   Cheese or DANNON'S LlGHT N FIT GREEK YOGURT  8 ounces of [place] unsweetened almond/cococunut milk    Treat yourself to a parfait made with whipped cream blueberiies, walnuts and vanilla greek yogurt  Avoid bananas, pineapple, grapes  and watermelon on a regular basis because they are high in sugar.  THINK OF THEM AS DESSERT  Remember that snack Substitutions should be less than 10 NET carbs per serving and meals < 20 carbs. Remember to subtract fiber grams to get the "net carbs."

## 2021-04-22 NOTE — Assessment & Plan Note (Signed)
Declines breast exam and pelvic exam as she follows with GYN. Reports pap utd.

## 2021-04-22 NOTE — Progress Notes (Signed)
Subjective:    Patient ID: Molly Sanchez, female    DOB: May 22, 1971, 50 y.o.   MRN: [id number]  CC: Molly Sanchez is a 50 y.o. female who presents today for physical exam.    HPI: Frustrated with weight Compliant with saxenda 3mg  however she feels that her appetite is not curbed. She is walking 20 minutes per day.    Colorectal Cancer Screening: UTD , repeat 2023 Breast Cancer Screening: Mammogram UTD Cervical Cancer Screening: due  follows with Dr Ronita Hipps whom prescribes OCP          Tetanus - utd         Hepatitis C screening - Candidate for, consents Labs: Screening labs today. Exercise: Gets regular exercise.   Alcohol use:  none Smoking/tobacco use: Nonsmoker.     HISTORY:  Past Medical History:  Diagnosis Date  . ADD (attention deficit disorder)   . ANA positive   . Antiphospholipid antibody syndrome ([place]) 11/15/2012  . Antiphospholipid antibody syndrome complicating pregnancy ([place])   . Back pain   . Breast fibroadenoma    history of  . Clotting disorder ([place])    with pregancy  . Depression   . Dysplasia of cervix, low grade (CIN 1) [id number]  . Fatigue   . Fibromyalgia   . GERD (gastroesophageal reflux disease)   . H/O varicella   . Hx of blood clots    During pregnancy  . Joint pain   . Raynaud's disease   . Rosacea   . Vitamin D deficiency     Past Surgical History:  Procedure Laterality Date  . CESAREAN SECTION    . GYNECOLOGIC CRYOSURGERY     of cervix  . right breast fibroadenoma removed     Family History  Problem Relation Age of Onset  . Hypertension Mother   . Stroke Mother   . Heart attack Mother   . Diabetes Mother   . Hyperlipidemia Mother   . Heart disease Mother   . Depression Mother   . Anxiety disorder Mother   . Schizophrenia Mother   . Obesity Mother   . Mental illness Brother        schizophrenia  . Colon cancer Neg Hx   . Esophageal cancer Neg Hx   . Rectal cancer Neg Hx   . Stomach cancer Neg Hx   .  Pancreatic cancer Neg Hx   . Thyroid cancer Neg Hx       ALLERGIES: Patient has no known allergies.  Current Outpatient Medications on File Prior to Visit  Medication Sig Dispense Refill  . amphetamine-dextroamphetamine (ADDERALL) 20 MG tablet   0  . amphetamine-dextroamphetamine (ADDERALL) 20 MG tablet TAKE 1 TABLET BY MOUTH 4 TIMES DAILY 360 tablet 0  . DULoxetine (CYMBALTA) 60 MG capsule TAKE 1 CAPSULE BY MOUTH ONCE A DAY 90 capsule 1  . Liraglutide -Weight Management 18 MG/3ML SOPN INJECT 3 MG UNDER THE SKIN ONCE A DAY AS DIRECTED 15 mL 3  . norethindrone (AYGESTIN) 5 MG tablet TAKE 1 TABLET BY MOUTH 2 TIMES DAILY FOR 5 DAYS 10 tablet 6  . Insulin Pen Needle (PEN NEEDLES) 31G X 6 MM MISC Use one pen needle daily. 100 each 1  . metroNIDAZOLE (METROGEL) 0.75 % vaginal gel INSERT 1 APPLICATORFUL EVERY DAY BY VAGINAL ROUTE AT BEDTIME. (Patient not taking: Reported on 04/22/2021) 70 g 0   Current Facility-Administered Medications on File Prior to Visit  Medication Dose Route Frequency Provider Last Rate Last Admin  .  0.9 %  sodium chloride infusion  500 mL Intravenous Continuous Pyrtle, Lajuan Lines, MD        Social History   Tobacco Use  . Smoking status: Never Smoker  . Smokeless tobacco: Never Used  Vaping Use  . Vaping Use: Never used  Substance Use Topics  . Alcohol use: No    Alcohol/week: 1.0 standard drink    Types: 1 Glasses of wine per week    Comment: none   . Drug use: No    Review of Systems  Constitutional: Negative for chills, fever and unexpected weight change.  HENT: Negative for congestion and trouble swallowing.   Respiratory: Negative for cough.   Cardiovascular: Negative for chest pain, palpitations and leg swelling.  Gastrointestinal: Negative for nausea and vomiting.  Musculoskeletal: Negative for arthralgias and myalgias.  Skin: Negative for rash.  Neurological: Negative for headaches.  Hematological: Negative for adenopathy.  Psychiatric/Behavioral:  Negative for confusion.      Objective:    BP 106/64   Pulse 74   Temp 97.6 F (36.4 C) (Oral)   Ht 5\' 7"  (1.702 m)   Wt 254 lb 6.4 oz (115.4 kg)   SpO2 99%   BMI 39.84 kg/m   BP Readings from Last 3 Encounters:  04/22/21 106/64  12/22/20 118/72  10/20/20 110/70   Wt Readings from Last 3 Encounters:  04/22/21 254 lb 6.4 oz (115.4 kg)  12/22/20 252 lb 6.4 oz (114.5 kg)  10/20/20 245 lb (111.1 kg)    Physical Exam Vitals reviewed.  Constitutional:      Appearance: She is well-developed.  Eyes:     Conjunctiva/sclera: Conjunctivae normal.  Neck:     Thyroid: No thyroid mass.     Comments: Enlarged thyroid bilaterally Cardiovascular:     Rate and Rhythm: Normal rate and regular rhythm.     Pulses: Normal pulses.     Heart sounds: Normal heart sounds.  Pulmonary:     Effort: Pulmonary effort is normal.     Breath sounds: Normal breath sounds. No wheezing, rhonchi or rales.  Lymphadenopathy:     Head:     Right side of head: No submental, submandibular, tonsillar, preauricular, posterior auricular or occipital adenopathy.     Left side of head: No submental, submandibular, tonsillar, preauricular, posterior auricular or occipital adenopathy.     Cervical: No cervical adenopathy.  Skin:    General: Skin is warm and dry.  Neurological:     Mental Status: She is alert.  Psychiatric:        Speech: Speech normal.        Behavior: Behavior normal.        Thought Content: Thought content normal.        Assessment & Plan:   Problem List Items Addressed This Visit      Other   Class 2 obesity without serious comorbidity with body mass index (BMI) of 37.0 to 37.9 in adult    Weight unchanged. Advised she can resume lower dose of saxenda as she felt 1.8mg  more effective. Adjunct and titrate metformin xr. Counseled on using weights with walking and low glycemic diet. follow up.       Relevant Medications   metFORMIN (GLUCOPHAGE XR) 500 MG 24 hr tablet   Depression    Routine general medical examination at a health care facility - Primary    Declines breast exam and pelvic exam as she follows with GYN. Reports pap utd.  Relevant Orders   Hepatitis C antibody    Other Visit Diagnoses    Enlarged thyroid       Relevant Orders   US THYROID       I am having Talasia B. Limon start on metFORMIN. I am also having her maintain her amphetamine-dextroamphetamine, Pen Needles, metroNIDAZOLE, amphetamine-dextroamphetamine, DULoxetine, Liraglutide -Weight Management, and norethindrone. We will continue to administer sodium chloride.   Meds ordered this encounter  Medications  . metFORMIN (GLUCOPHAGE XR) 500 MG 24 hr tablet    Sig: Start 500mg  PO qpm.    Dispense:  90 tablet    Refill:  3    Order Specific Question:   Supervising Provider    Answer:   Crecencio Mc [[id number]]    Return precautions given.   Risks, benefits, and alternatives of the medications and treatment plan prescribed today were discussed, and patient expressed understanding.   Education regarding symptom management and diagnosis given to patient on AVS.   Continue to follow with Burnard Hawthorne, FNP for routine health maintenance.   [name] and I agreed with plan.   Mable Paris, FNP

## 2021-04-22 NOTE — Assessment & Plan Note (Addendum)
Weight unchanged. Advised she can resume lower dose of saxenda as she felt 1.8mg  more effective. Adjunct and titrate metformin xr. Counseled on using weights with walking and low glycemic diet. follow up.

## 2021-04-25 ENCOUNTER — Other Ambulatory Visit (HOSPITAL_COMMUNITY): Payer: Self-pay

## 2021-04-25 ENCOUNTER — Telehealth: Payer: Self-pay | Admitting: Family

## 2021-04-25 LAB — HEPATITIS C ANTIBODY
Hepatitis C Ab: NONREACTIVE
SIGNAL TO CUT-OFF: 0.01 (ref <1.00)

## 2021-04-25 NOTE — Telephone Encounter (Signed)
lft vm for pt to call ofc to sch [place] thanks 

## 2021-04-26 ENCOUNTER — Encounter: Payer: Self-pay | Admitting: Family

## 2021-04-28 NOTE — Telephone Encounter (Signed)
Pt called returning your call 

## 2021-04-29 ENCOUNTER — Ambulatory Visit: Payer: Self-pay | Admitting: Psychology

## 2021-04-29 ENCOUNTER — Telehealth: Payer: Self-pay | Admitting: Family

## 2021-04-29 NOTE — Telephone Encounter (Signed)
Pt returned referral's phone call.

## 2021-04-29 NOTE — Telephone Encounter (Signed)
lft vm for pt to call ofc to sch [place] thanks 

## 2021-05-02 ENCOUNTER — Other Ambulatory Visit (HOSPITAL_COMMUNITY): Payer: Self-pay

## 2021-05-02 MED ORDER — VYVANSE 70 MG PO CAPS
70.0000 mg | ORAL_CAPSULE | Freq: Every morning | ORAL | 0 refills | Status: DC
  Filled 2021-05-02: qty 90, 90d supply, fill #0

## 2021-05-02 MED ORDER — VITAMIN D3 1.25 MG (50000 UT) PO CAPS
ORAL_CAPSULE | ORAL | 4 refills | Status: AC
  Filled 2021-05-02: qty 12, 84d supply, fill #0
  Filled 2021-08-17 – 2021-09-03 (×2): qty 12, 84d supply, fill #1
  Filled 2021-11-19: qty 12, 84d supply, fill #2

## 2021-05-03 ENCOUNTER — Telehealth: Payer: Self-pay | Admitting: Family

## 2021-05-03 NOTE — Telephone Encounter (Signed)
lft vm for pt to call ofc to sch [place] thanks

## 2021-05-12 ENCOUNTER — Ambulatory Visit (INDEPENDENT_AMBULATORY_CARE_PROVIDER_SITE_OTHER): Payer: 59 | Admitting: Psychology

## 2021-05-12 DIAGNOSIS — F4323 Adjustment disorder with mixed anxiety and depressed mood: Secondary | ICD-10-CM | POA: Diagnosis not present

## 2021-05-13 ENCOUNTER — Other Ambulatory Visit: Payer: Self-pay

## 2021-05-13 ENCOUNTER — Other Ambulatory Visit (HOSPITAL_COMMUNITY): Payer: Self-pay

## 2021-05-13 ENCOUNTER — Ambulatory Visit (HOSPITAL_COMMUNITY)
Admission: RE | Admit: 2021-05-13 | Discharge: 2021-05-13 | Disposition: A | Discharge status: Home / Self Care | Payer: 59 | Source: Ambulatory Visit | Attending: Family | Admitting: Family

## 2021-05-13 DIAGNOSIS — E049 Nontoxic goiter, unspecified: Secondary | ICD-10-CM | POA: Diagnosis not present

## 2021-05-13 DIAGNOSIS — E079 Disorder of thyroid, unspecified: Secondary | ICD-10-CM | POA: Diagnosis not present

## 2021-05-13 MED FILL — Liraglutide (Weight Mngmt) Soln Pen-Inj 18 MG/3ML (6 MG/ML): SUBCUTANEOUS | 30 days supply | Qty: 15 | Fill #1 | Status: AC

## 2021-05-13 MED FILL — Duloxetine HCl Enteric Coated Pellets Cap 60 MG (Base Eq): ORAL | 90 days supply | Qty: 90 | Fill #0 | Status: AC

## 2021-06-06 ENCOUNTER — Encounter: Payer: Self-pay | Admitting: Family

## 2021-06-08 ENCOUNTER — Other Ambulatory Visit: Payer: Self-pay | Admitting: Family

## 2021-06-08 ENCOUNTER — Other Ambulatory Visit (HOSPITAL_COMMUNITY): Payer: Self-pay

## 2021-06-08 DIAGNOSIS — N950 Postmenopausal bleeding: Secondary | ICD-10-CM | POA: Diagnosis not present

## 2021-06-08 DIAGNOSIS — E669 Obesity, unspecified: Secondary | ICD-10-CM

## 2021-06-08 MED ORDER — OZEMPIC (1 MG/DOSE) 4 MG/3ML ~~LOC~~ SOPN
1.0000 mg | PEN_INJECTOR | SUBCUTANEOUS | 2 refills | Status: DC
Start: 2021-06-08 — End: 2021-07-11
  Filled 2021-06-08: qty 3, 28d supply, fill #0

## 2021-06-14 ENCOUNTER — Other Ambulatory Visit (HOSPITAL_COMMUNITY): Payer: Self-pay

## 2021-06-16 ENCOUNTER — Ambulatory Visit (INDEPENDENT_AMBULATORY_CARE_PROVIDER_SITE_OTHER): Payer: 59 | Admitting: Psychology

## 2021-06-16 DIAGNOSIS — F4323 Adjustment disorder with mixed anxiety and depressed mood: Secondary | ICD-10-CM | POA: Diagnosis not present

## 2021-07-10 ENCOUNTER — Encounter: Payer: Self-pay | Admitting: Family

## 2021-07-11 ENCOUNTER — Other Ambulatory Visit (HOSPITAL_COMMUNITY): Payer: Self-pay

## 2021-07-11 ENCOUNTER — Other Ambulatory Visit: Payer: Self-pay | Admitting: Family

## 2021-07-11 DIAGNOSIS — E669 Obesity, unspecified: Secondary | ICD-10-CM

## 2021-07-11 MED ORDER — OZEMPIC (2 MG/DOSE) 8 MG/3ML ~~LOC~~ SOPN
2.0000 mg | PEN_INJECTOR | SUBCUTANEOUS | 2 refills | Status: DC
  Filled 2021-07-11: qty 3, 28d supply, fill #0
  Filled 2021-08-08: qty 3, 28d supply, fill #1

## 2021-07-11 NOTE — Telephone Encounter (Signed)
Please advise on dose increase

## 2021-08-05 ENCOUNTER — Other Ambulatory Visit (HOSPITAL_COMMUNITY): Payer: Self-pay

## 2021-08-08 ENCOUNTER — Other Ambulatory Visit (HOSPITAL_COMMUNITY): Payer: Self-pay

## 2021-08-09 ENCOUNTER — Other Ambulatory Visit: Payer: Self-pay | Admitting: Family

## 2021-08-09 ENCOUNTER — Other Ambulatory Visit (HOSPITAL_COMMUNITY): Payer: Self-pay

## 2021-08-09 DIAGNOSIS — E669 Obesity, unspecified: Secondary | ICD-10-CM

## 2021-08-09 MED ORDER — VYVANSE 70 MG PO CAPS
ORAL_CAPSULE | ORAL | 0 refills | Status: DC
  Filled 2021-08-09: qty 90, 90d supply, fill #0

## 2021-08-10 ENCOUNTER — Other Ambulatory Visit (HOSPITAL_COMMUNITY): Payer: Self-pay

## 2021-08-10 MED ORDER — OZEMPIC (2 MG/DOSE) 8 MG/3ML ~~LOC~~ SOPN
2.0000 mg | PEN_INJECTOR | SUBCUTANEOUS | 2 refills | Status: DC
Start: 2021-08-10 — End: 2022-03-06
  Filled 2021-08-10: qty 9, 84d supply, fill #0
  Filled 2021-09-03: qty 3, 28d supply, fill #0
  Filled 2021-10-17: qty 3, 28d supply, fill #1
  Filled 2021-11-10 – 2021-11-19 (×2): qty 3, 28d supply, fill #2
  Filled 2021-12-12: qty 3, 28d supply, fill #3
  Filled 2021-12-24: qty 9, 84d supply, fill #3

## 2021-08-11 ENCOUNTER — Other Ambulatory Visit (HOSPITAL_COMMUNITY): Payer: Self-pay

## 2021-08-17 ENCOUNTER — Other Ambulatory Visit (HOSPITAL_COMMUNITY): Payer: Self-pay

## 2021-08-25 ENCOUNTER — Other Ambulatory Visit (HOSPITAL_COMMUNITY): Payer: Self-pay

## 2021-09-03 ENCOUNTER — Other Ambulatory Visit (HOSPITAL_COMMUNITY): Payer: Self-pay

## 2021-09-05 ENCOUNTER — Other Ambulatory Visit (HOSPITAL_COMMUNITY): Payer: Self-pay

## 2021-09-10 ENCOUNTER — Other Ambulatory Visit (HOSPITAL_COMMUNITY): Payer: Self-pay

## 2021-10-17 ENCOUNTER — Other Ambulatory Visit (HOSPITAL_COMMUNITY): Payer: Self-pay

## 2021-10-20 ENCOUNTER — Other Ambulatory Visit (HOSPITAL_COMMUNITY): Payer: Self-pay

## 2021-10-20 MED ORDER — CARESTART COVID-19 HOME TEST VI KIT
PACK | 0 refills | Status: AC
  Filled 2021-10-20: qty 4, 4d supply, fill #0

## 2021-10-27 ENCOUNTER — Other Ambulatory Visit (HOSPITAL_COMMUNITY): Payer: Self-pay

## 2021-10-27 MED ORDER — VYVANSE 70 MG PO CAPS
ORAL_CAPSULE | ORAL | 0 refills | Status: DC
  Filled 2021-10-27 – 2021-11-07 (×3): qty 90, 90d supply, fill #0

## 2021-11-02 ENCOUNTER — Other Ambulatory Visit (HOSPITAL_COMMUNITY): Payer: Self-pay

## 2021-11-07 ENCOUNTER — Other Ambulatory Visit (HOSPITAL_COMMUNITY): Payer: Self-pay

## 2021-11-10 ENCOUNTER — Other Ambulatory Visit (HOSPITAL_COMMUNITY): Payer: Self-pay

## 2021-11-18 ENCOUNTER — Other Ambulatory Visit (HOSPITAL_COMMUNITY): Payer: Self-pay

## 2021-11-19 ENCOUNTER — Other Ambulatory Visit (HOSPITAL_COMMUNITY): Payer: Self-pay

## 2021-12-13 ENCOUNTER — Other Ambulatory Visit (HOSPITAL_COMMUNITY): Payer: Self-pay

## 2021-12-15 IMAGING — MG MM DIGITAL SCREENING BILAT W/ TOMO AND CAD
8 series · 8 of 24 positions shown · non-contrast
Comparison: Previous exam(s).

CLINICAL DATA: Screening.

EXAM:
DIGITAL SCREENING BILATERAL MAMMOGRAM WITH TOMOSYNTHESIS AND CAD
TECHNIQUE: Bilateral screening digital craniocaudal and mediolateral oblique
mammograms were obtained. Bilateral screening digital breast
tomosynthesis was performed. The images were evaluated with
computer-aided detection.

[R CC synth-2D]
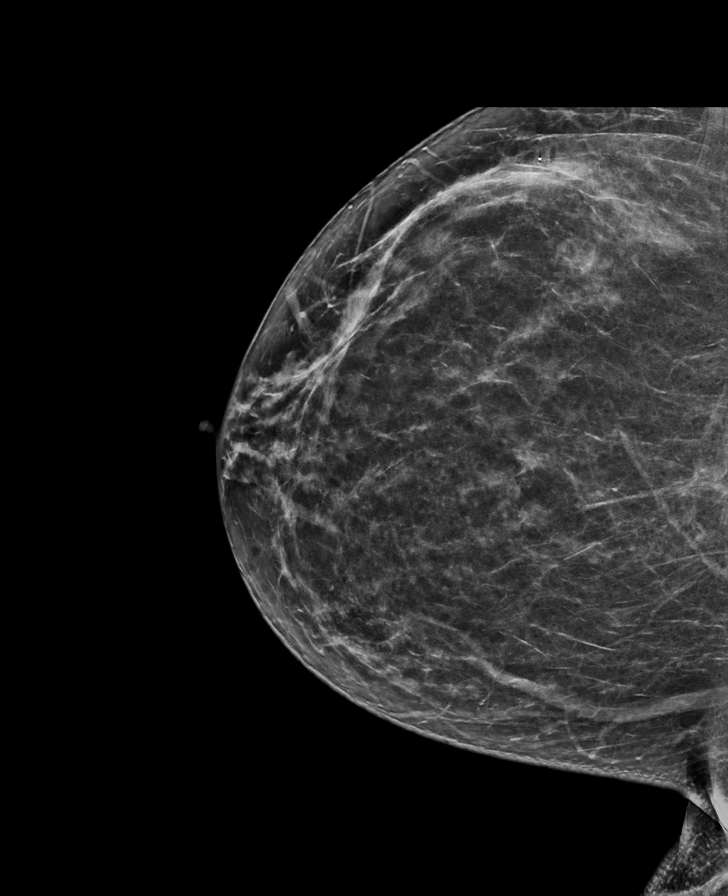

[L CC synth-2D]
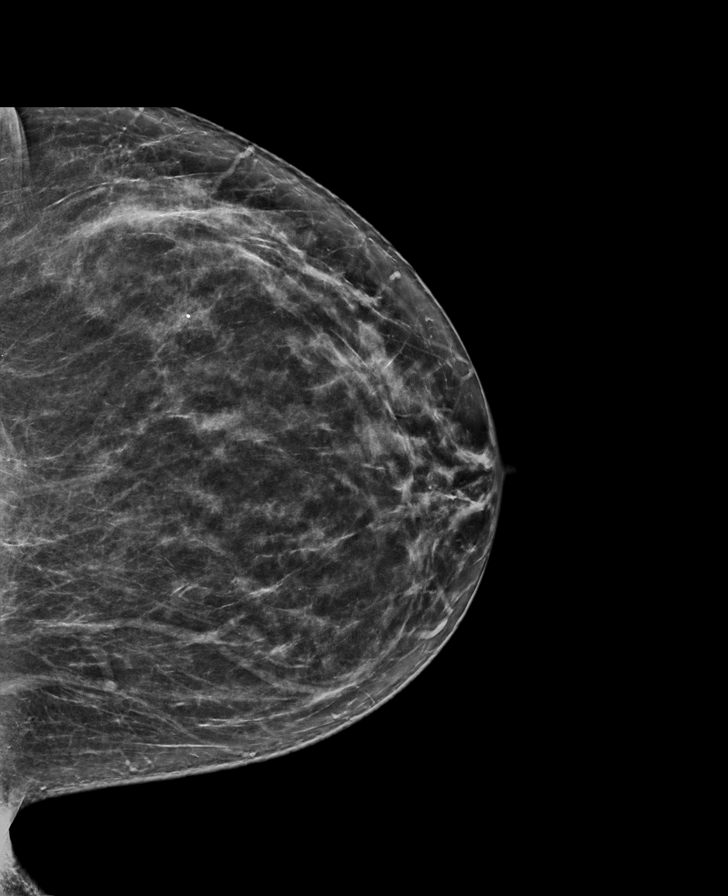

[L MLO synth-2D]
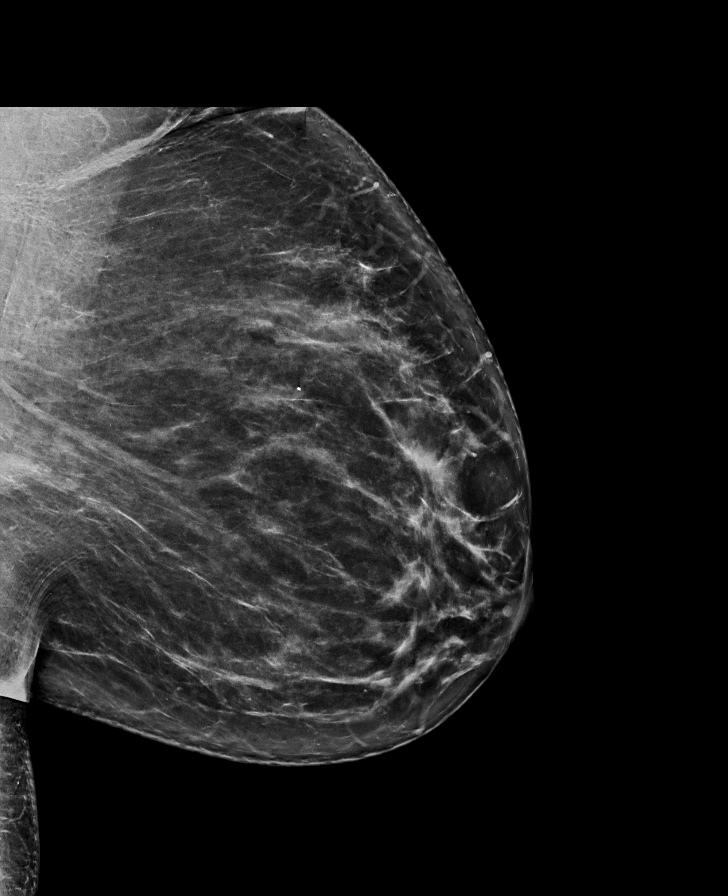

[R MLO synth-2D]
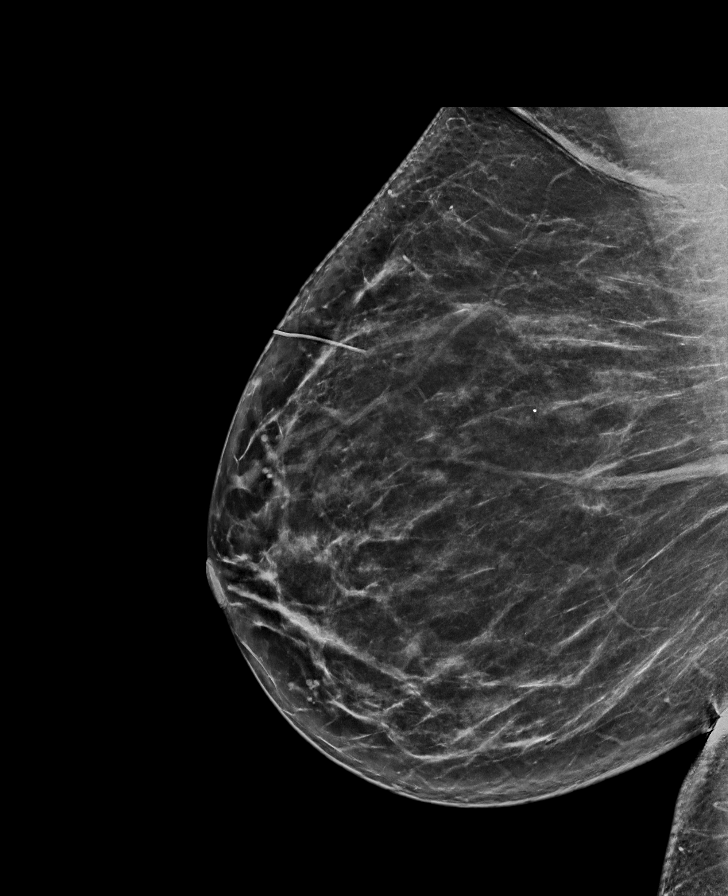

[R MLO tomo · tomo slice 41/81.0]
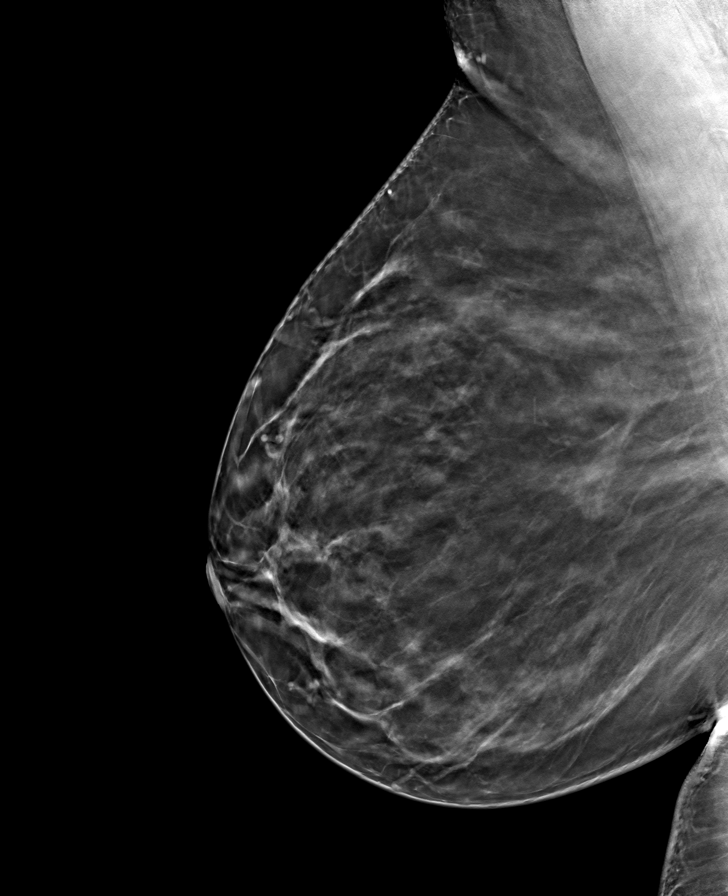

[L CC tomo · tomo slice 39/76.0]
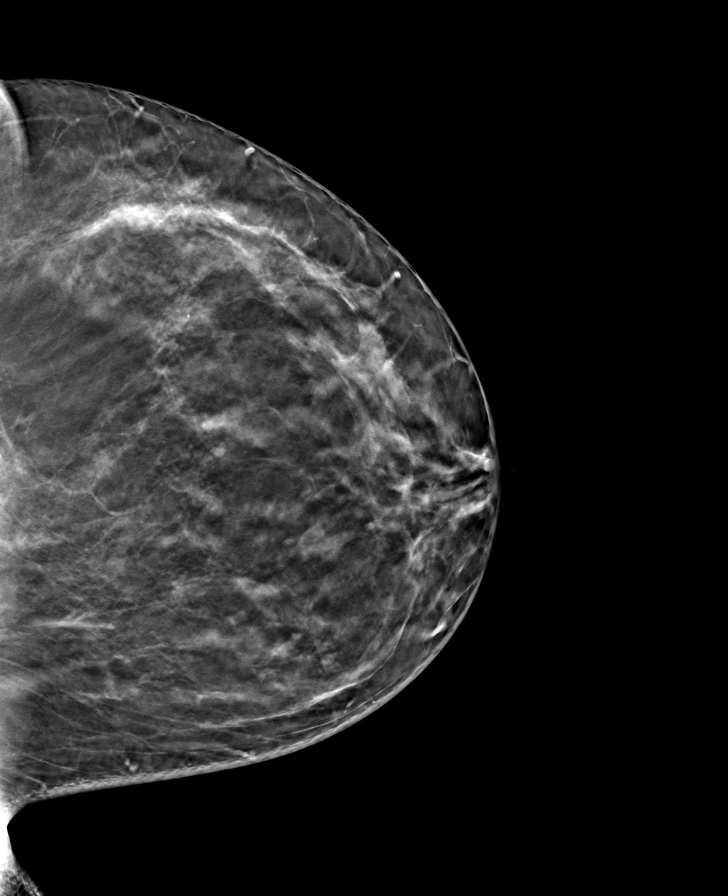

[L MLO tomo · tomo slice 45/89.0]
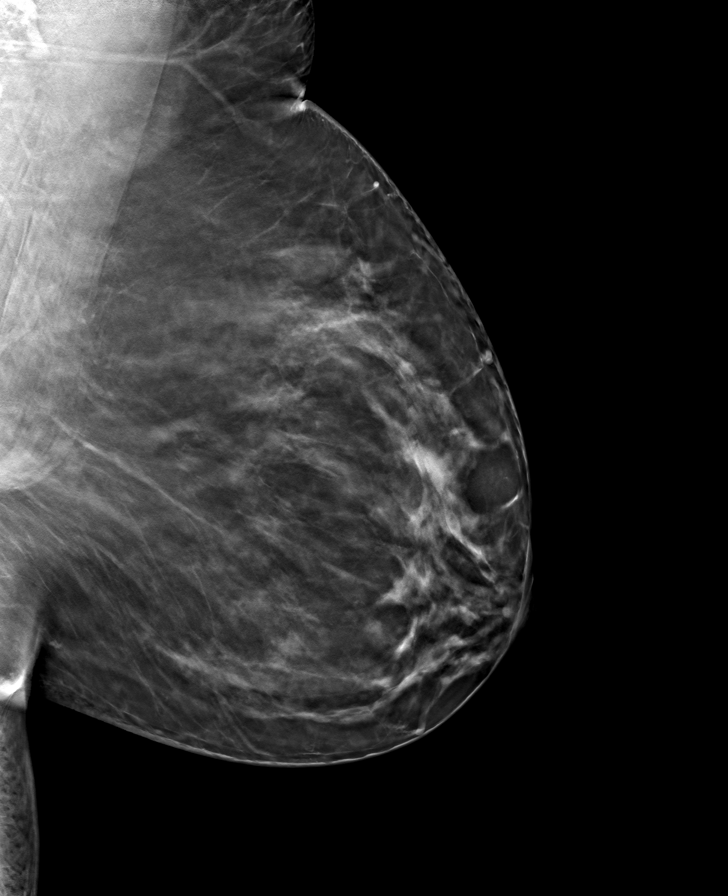

[R CC tomo · tomo slice 37/74.0]
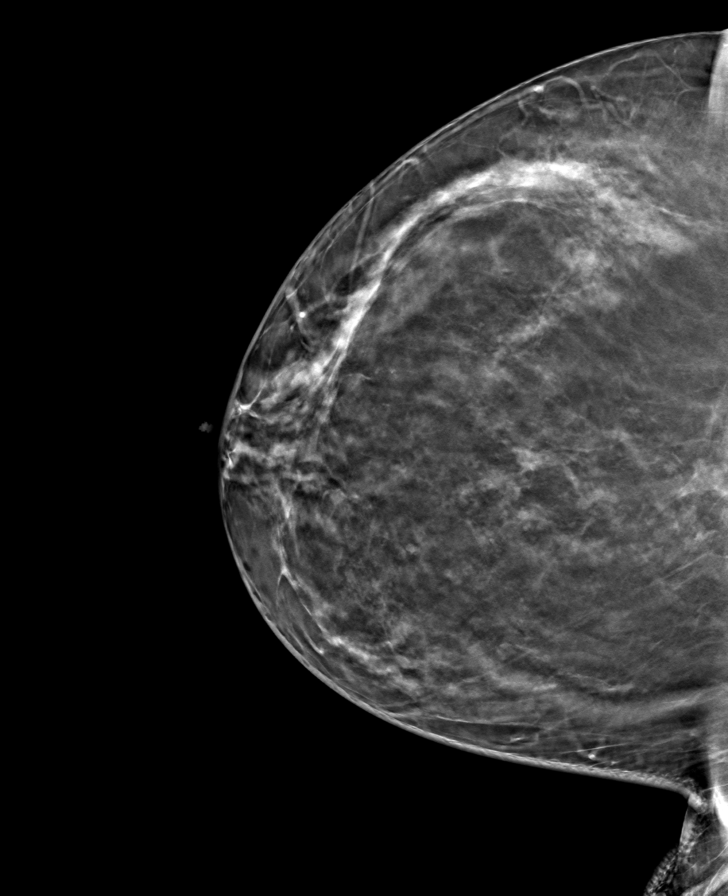

[8 of 24 positions shown; findings below may reference images not displayed]

ACR Breast Density Category b: There are scattered areas of
fibroglandular density.
FINDINGS: In the right breast, a possible asymmetry warrants further
evaluation. In the left breast, no findings suspicious for
malignancy. The images were evaluated with computer-aided detection.
IMPRESSION: Further evaluation is suggested for possible asymmetry in the right
breast.

RECOMMENDATION:
Diagnostic mammogram and possibly ultrasound of the right breast.
(Code:[id number])

The patient will be contacted regarding the findings, and additional
imaging will be scheduled.

BI-RADS CATEGORY  0: Incomplete. Need additional imaging evaluation
and/or prior mammograms for comparison.

## 2021-12-21 ENCOUNTER — Other Ambulatory Visit (HOSPITAL_COMMUNITY): Payer: Self-pay

## 2021-12-24 ENCOUNTER — Other Ambulatory Visit (HOSPITAL_COMMUNITY): Payer: Self-pay

## 2022-01-14 ENCOUNTER — Other Ambulatory Visit (HOSPITAL_COMMUNITY): Payer: Self-pay

## 2022-01-14 ENCOUNTER — Telehealth: Payer: 59 | Admitting: Nurse Practitioner

## 2022-01-14 DIAGNOSIS — U071 COVID-19: Secondary | ICD-10-CM

## 2022-01-14 MED ORDER — MOLNUPIRAVIR EUA 200MG CAPSULE
4.0000 | ORAL_CAPSULE | Freq: Two times a day (BID) | ORAL | 0 refills | Status: AC
  Filled 2022-01-14: qty 40, 5d supply, fill #0

## 2022-01-14 NOTE — Patient Instructions (Signed)
COVID-19: Quarantine and Isolation °Quarantine °If you were exposed °Quarantine and stay away from others when you have been in close contact with someone who has COVID-19. °Isolate °If you are sick or test positive °Isolate when you are sick or when you have COVID-19, even if you don't have symptoms. °When to stay home °Calculating quarantine °The date of your exposure is considered day 0. Day 1 is the first full day after your last contact with a person who has had COVID-19. Stay home and away from other people for at least 5 days. Learn why CDC updated guidance for the general public. °IF YOU were exposed to COVID-19 and are NOT  °up to dateIF YOU were exposed to COVID-19 and are NOT on COVID-19 vaccinations °Quarantine for at least 5 days °Stay home °Stay home and quarantine for at least 5 full days. °Wear a well-fitting mask if you must be around others in your home. °Do not travel. °Get tested °Even if you don't develop symptoms, get tested at least 5 days after you last had close contact with someone with COVID-19. °After quarantine °Watch for symptoms °Watch for symptoms until 10 days after you last had close contact with someone with COVID-19. °Avoid travel °It is best to avoid travel until a full 10 days after you last had close contact with someone with COVID-19. °If you develop symptoms °Isolate immediately and get tested. Continue to stay home until you know the results. Wear a well-fitting mask around others. °Take precautions until day 10 °Wear a well-fitting mask °Wear a well-fitting mask for 10 full days any time you are around others inside your home or in public. Do not go to places where you are unable to wear a well-fitting mask. °If you must travel during days 6-10, take precautions. °Avoid being around people who are more likely to get very sick from COVID-19. °IF YOU were exposed to COVID-19 and are  °up to dateIF YOU were exposed to COVID-19 and are on COVID-19 vaccinations °No  quarantine °You do not need to stay home unless you develop symptoms. °Get tested °Even if you don't develop symptoms, get tested at least 5 days after you last had close contact with someone with COVID-19. °Watch for symptoms °Watch for symptoms until 10 days after you last had close contact with someone with COVID-19. °If you develop symptoms °Isolate immediately and get tested. Continue to stay home until you know the results. Wear a well-fitting mask around others. °Take precautions until day 10 °Wear a well-fitting mask °Wear a well-fitting mask for 10 full days any time you are around others inside your home or in public. Do not go to places where you are unable to wear a well-fitting mask. °Take precautions if traveling °Avoid being around people who are more likely to get very sick from COVID-19. °IF YOU were exposed to COVID-19 and had confirmed COVID-19 within the past 90 days (you tested positive using a viral test) °No quarantine °You do not need to stay home unless you develop symptoms. °Watch for symptoms °Watch for symptoms until 10 days after you last had close contact with someone with COVID-19. °If you develop symptoms °Isolate immediately and get tested. Continue to stay home until you know the results. Wear a well-fitting mask around others. °Take precautions until day 10 °Wear a well-fitting mask °Wear a well-fitting mask for 10 full days any time you are around others inside your home or in public. Do not go to places where you are   unable to wear a well-fitting mask. °Take precautions if traveling °Avoid being around people who are more likely to get very sick from COVID-19. °Calculating isolation °Day 0 is your first day of symptoms or a positive viral test. Day 1 is the first full day after your symptoms developed or your test specimen was collected. If you have COVID-19 or have symptoms, isolate for at least 5 days. °IF YOU tested positive for COVID-19 or have symptoms, regardless of  vaccination status °Stay home for at least 5 days °Stay home for 5 days and isolate from others in your home. °Wear a well-fitting mask if you must be around others in your home. °Do not travel. °Ending isolation if you had symptoms °End isolation after 5 full days if you are fever-free for 24 hours (without the use of fever-reducing medication) and your symptoms are improving. °Ending isolation if you did NOT have symptoms °End isolation after at least 5 full days after your positive test. °If you got very sick from COVID-19 or have a weakened immune system °You should isolate for at least 10 days. Consult your doctor before ending isolation. °Take precautions until day 10 °Wear a well-fitting mask °Wear a well-fitting mask for 10 full days any time you are around others inside your home or in public. Do not go to places where you are unable to wear a well-fitting mask. °Do not travel °Do not travel until a full 10 days after your symptoms started or the date your positive test was taken if you had no symptoms. °Avoid being around people who are more likely to get very sick from COVID-19. °Definitions °Exposure °Contact with someone infected with SARS-CoV-2, the virus that causes COVID-19, in a way that increases the likelihood of getting infected with the virus. °Close contact °A close contact is someone who was less than 6 feet away from an infected person (laboratory-confirmed or a clinical diagnosis) for a cumulative total of 15 minutes or more over a 24-hour period. For example, three individual 5-minute exposures for a total of 15 minutes. People who are exposed to someone with COVID-19 after they completed at least 5 days of isolation are not considered close contacts. °Quarantine °Quarantine is a strategy used to prevent transmission of COVID-19 by keeping people who have been in close contact with someone with COVID-19 apart from others. °Who does not need to quarantine? °If you had close contact with  someone with COVID-19 and you are in one of the following groups, you do not need to quarantine. °You are up to date with your COVID-19 vaccines. °You had confirmed COVID-19 within the last 90 days (meaning you tested positive using a viral test). °If you are up to date with COVID-19 vaccines, you should wear a well-fitting mask around others for 10 days from the date of your last close contact with someone with COVID-19 (the date of last close contact is considered day 0). Get tested at least 5 days after you last had close contact with someone with COVID-19. If you test positive or develop COVID-19 symptoms, isolate from other people and follow recommendations in the Isolation section below. If you tested positive for COVID-19 with a viral test within the previous 90 days and subsequently recovered and remain without COVID-19 symptoms, you do not need to quarantine or get tested after close contact. You should wear a well-fitting mask around others for 10 days from the date of your last close contact with someone with COVID-19 (the date of last   close contact is considered day 0). If you have COVID-19 symptoms, get tested and isolate from other people and follow recommendations in the Isolation section below. °Who should quarantine? °If you come into close contact with someone with COVID-19, you should quarantine if you are not up to date on COVID-19 vaccines. This includes people who are not vaccinated. °What to do for quarantine °Stay home and away from other people for at least 5 days (day 0 through day 5) after your last contact with a person who has COVID-19. The date of your exposure is considered day 0. Wear a well-fitting mask when around others at home, if possible. °For 10 days after your last close contact with someone with COVID-19, watch for fever (100.4°F or greater), cough, shortness of breath, or other COVID-19 symptoms. °If you develop symptoms, get tested immediately and isolate until you receive  your test results. If you test positive, follow isolation recommendations. °If you do not develop symptoms, get tested at least 5 days after you last had close contact with someone with COVID-19. °If you test negative, you can leave your home, but continue to wear a well-fitting mask when around others at home and in public until 10 days after your last close contact with someone with COVID-19. °If you test positive, you should isolate for at least 5 days from the date of your positive test (if you do not have symptoms). If you do develop COVID-19 symptoms, isolate for at least 5 days from the date your symptoms began (the date the symptoms started is day 0). Follow recommendations in the isolation section below. °If you are unable to get a test 5 days after last close contact with someone with COVID-19, you can leave your home after day 5 if you have been without COVID-19 symptoms throughout the 5-day period. Wear a well-fitting mask for 10 days after your date of last close contact when around others at home and in public. °Avoid people who are have weakened immune systems or are more likely to get very sick from COVID-19, and nursing homes and other high-risk settings, until after at least 10 days. °If possible, stay away from people you live with, especially people who are at higher risk for getting very sick from COVID-19, as well as others outside your home throughout the full 10 days after your last close contact with someone with COVID-19. °If you are unable to quarantine, you should wear a well-fitting mask for 10 days when around others at home and in public. °If you are unable to wear a mask when around others, you should continue to quarantine for 10 days. Avoid people who have weakened immune systems or are more likely to get very sick from COVID-19, and nursing homes and other high-risk settings, until after at least 10 days. °See additional information about travel. °Do not go to places where you are  unable to wear a mask, such as restaurants and some gyms, and avoid eating around others at home and at work until after 10 days after your last close contact with someone with COVID-19. °After quarantine °Watch for symptoms until 10 days after your last close contact with someone with COVID-19. °If you have symptoms, isolate immediately and get tested. °Quarantine in high-risk congregate settings °In certain congregate settings that have high risk of secondary transmission (such as correctional and detention facilities, homeless shelters, or cruise ships), CDC recommends a 10-day quarantine for residents, regardless of vaccination and booster status. During periods of critical staffing   shortages, facilities may consider shortening the quarantine period for staff to ensure continuity of operations. Decisions to shorten quarantine in these settings should be made in consultation with state, local, tribal, or territorial health departments and should take into consideration the context and characteristics of the facility. CDC's setting-specific guidance provides additional recommendations for these settings. °Isolation °Isolation is used to separate people with confirmed or suspected COVID-19 from those without COVID-19. People who are in isolation should stay home until it's safe for them to be around others. At home, anyone sick or infected should separate from others, or wear a well-fitting mask when they need to be around others. People in isolation should stay in a specific "sick room" or area and use a separate bathroom if available. Everyone who has presumed or confirmed COVID-19 should stay home and isolate from other people for at least 5 full days (day 0 is the first day of symptoms or the date of the day of the positive viral test for asymptomatic persons). They should wear a mask when around others at home and in public for an additional 5 days. People who are confirmed to have COVID-19 or are showing  symptoms of COVID-19 need to isolate regardless of their vaccination status. This includes: °People who have a positive viral test for COVID-19, regardless of whether or not they have symptoms. °People with symptoms of COVID-19, including people who are awaiting test results or have not been tested. People with symptoms should isolate even if they do not know if they have been in close contact with someone with COVID-19. °What to do for isolation °Monitor your symptoms. If you have an emergency warning sign (including trouble breathing), seek emergency medical care immediately. °Stay in a separate room from other household members, if possible. °Use a separate bathroom, if possible. °Take steps to improve ventilation at home, if possible. °Avoid contact with other members of the household and pets. °Don't share personal household items, like cups, towels, and utensils. °Wear a well-fitting mask when you need to be around other people. °Learn more about what to do if you are sick and how to notify your contacts. °Ending isolation for people who had COVID-19 and had symptoms °If you had COVID-19 and had symptoms, isolate for at least 5 days. To calculate your 5-day isolation period, day 0 is your first day of symptoms. Day 1 is the first full day after your symptoms developed. You can leave isolation after 5 full days. °You can end isolation after 5 full days if you are fever-free for 24 hours without the use of fever-reducing medication and your other symptoms have improved (Loss of taste and smell may persist for weeks or months after recovery and need not delay the end of isolation). °You should continue to wear a well-fitting mask around others at home and in public for 5 additional days (day 6 through day 10) after the end of your 5-day isolation period. If you are unable to wear a mask when around others, you should continue to isolate for a full 10 days. Avoid people who have weakened immune systems or are more  likely to get very sick from COVID-19, and nursing homes and other high-risk settings, until after at least 10 days. °If you continue to have fever or your other symptoms have not improved after 5 days of isolation, you should wait to end your isolation until you are fever-free for 24 hours without the use of fever-reducing medication and your other symptoms have improved.   Continue to wear a well-fitting mask through day 10. Contact your healthcare provider if you have questions. °See additional information about travel. °Do not go to places where you are unable to wear a mask, such as restaurants and some gyms, and avoid eating around others at home and at work until a full 10 days after your first day of symptoms. °If an individual has access to a test and wants to test, the best approach is to use an antigen test1 towards the end of the 5-day isolation period. Collect the test sample only if you are fever-free for 24 hours without the use of fever-reducing medication and your other symptoms have improved (loss of taste and smell may persist for weeks or months after recovery and need not delay the end of isolation). If your test result is positive, you should continue to isolate until day 10. If your test result is negative, you can end isolation, but continue to wear a well-fitting mask around others at home and in public until day 10. Follow additional recommendations for masking and avoiding travel as described above. °1As noted in the labeling for authorized over-the counter antigen tests: Negative results should be treated as presumptive. Negative results do not rule out SARS-CoV-2 infection and should not be used as the sole basis for treatment or patient management decisions, including infection control decisions. To improve results, antigen tests should be used twice over a three-day period with at least 24 hours and no more than 48 hours between tests. °Note that these recommendations on ending isolation  do not apply to people who are moderately ill or very sick from COVID-19 or have weakened immune systems. See section below for recommendations for when to end isolation for these groups. °Ending isolation for people who tested positive for COVID-19 but had no symptoms °If you test positive for COVID-19 and never develop symptoms, isolate for at least 5 days. Day 0 is the day of your positive viral test (based on the date you were tested) and day 1 is the first full day after the specimen was collected for your positive test. You can leave isolation after 5 full days. °If you continue to have no symptoms, you can end isolation after at least 5 days. °You should continue to wear a well-fitting mask around others at home and in public until day 10 (day 6 through day 10). If you are unable to wear a mask when around others, you should continue to isolate for 10 days. Avoid people who have weakened immune systems or are more likely to get very sick from COVID-19, and nursing homes and other high-risk settings, until after at least 10 days. °If you develop symptoms after testing positive, your 5-day isolation period should start over. Day 0 is your first day of symptoms. Follow the recommendations above for ending isolation for people who had COVID-19 and had symptoms. °See additional information about travel. °Do not go to places where you are unable to wear a mask, such as restaurants and some gyms, and avoid eating around others at home and at work until 10 days after the day of your positive test. °If an individual has access to a test and wants to test, the best approach is to use an antigen test1 towards the end of the 5-day isolation period. If your test result is positive, you should continue to isolate until day 10. If your test result is positive, you can also choose to test daily and if your test result   is negative, you can end isolation, but continue to wear a well-fitting mask around others at home and in  public until day 10. Follow additional recommendations for masking and avoiding travel as described above. °1As noted in the labeling for authorized over-the counter antigen tests: Negative results should be treated as presumptive. Negative results do not rule out SARS-CoV-2 infection and should not be used as the sole basis for treatment or patient management decisions, including infection control decisions. To improve results, antigen tests should be used twice over a three-day period with at least 24 hours and no more than 48 hours between tests. °Ending isolation for people who were moderately or very sick from COVID-19 or have a weakened immune system °People who are moderately ill from COVID-19 (experiencing symptoms that affect the lungs like shortness of breath or difficulty breathing) should isolate for 10 days and follow all other isolation precautions. To calculate your 10-day isolation period, day 0 is your first day of symptoms. Day 1 is the first full day after your symptoms developed. If you are unsure if your symptoms are moderate, talk to a healthcare provider for further guidance. °People who are very sick from COVID-19 (this means people who were hospitalized or required intensive care or ventilation support) and people who have weakened immune systems might need to isolate at home longer. They may also require testing with a viral test to determine when they can be around others. CDC recommends an isolation period of at least 10 and up to 20 days for people who were very sick from COVID-19 and for people with weakened immune systems. Consult with your healthcare provider about when you can resume being around other people. If you are unsure if your symptoms are severe or if you have a weakened immune system, talk to a healthcare provider for further guidance. °People who have a weakened immune system should talk to their healthcare provider about the potential for reduced immune responses to  COVID-19 vaccines and the need to continue to follow current prevention measures (including wearing a well-fitting mask and avoiding crowds and poorly ventilated indoor spaces) to protect themselves against COVID-19 until advised otherwise by their healthcare provider. Close contacts of immunocompromised people--including household members--should also be encouraged to receive all recommended COVID-19 vaccine doses to help protect these people. °Isolation in high-risk congregate settings °In certain high-risk congregate settings that have high risk of secondary transmission and where it is not feasible to cohort people (such as correctional and detention facilities, homeless shelters, and cruise ships), CDC recommends a 10-day isolation period for residents. During periods of critical staffing shortages, facilities may consider shortening the isolation period for staff to ensure continuity of operations. Decisions to shorten isolation in these settings should be made in consultation with state, local, tribal, or territorial health departments and should take into consideration the context and characteristics of the facility. CDC's setting-specific guidance provides additional recommendations for these settings. °This CDC guidance is meant to supplement--not replace--any federal, state, local, territorial, or tribal health and safety laws, rules, and regulations. °Recommendations for specific settings °These recommendations do not apply to healthcare professionals. For guidance specific to these settings, see °Healthcare professionals: Interim Guidance for Managing Healthcare Personnel with SARS-CoV-2 Infection or Exposure to SARS-CoV-2 °Patients, residents, and visitors to healthcare settings: Interim Infection Prevention and Control Recommendations for Healthcare Personnel During the Coronavirus Disease 2019 (COVID-19) Pandemic °Additional setting-specific guidance and recommendations are available. °These  recommendations on quarantine and isolation do apply to K[employer]12 School   settings. Additional guidance is available here: Overview of COVID-19 Quarantine for K-12 Schools °Travelers: Travel information and recommendations °Congregate facilities and other settings: guidance pages for community, work, and school settings °Ongoing COVID-19 exposure FAQs °I live with someone with COVID-19, but I cannot be separated from them. How do we manage quarantine in this situation? °It is very important for people with COVID-19 to remain apart from other people, if possible, even if they are living together. If separation of the person with COVID-19 from others that they live with is not possible, the other people that they live with will have ongoing exposure, meaning they will be repeatedly exposed until that person is no longer able to spread the virus to other people. In this situation, there are precautions you can take to limit the spread of COVID-19: °The person with COVID-19 and everyone they live with should wear a well-fitting mask inside the home. °If possible, one person should care for the person with COVID-19 to limit the number of people who are in close contact with the infected person. °Take steps to protect yourself and others to reduce transmission in the home: °Quarantine if you are not up to date with your COVID-19 vaccines. °Isolate if you are sick or tested positive for COVID-19, even if you don't have symptoms. °Learn more about the public health recommendations for testing, mask use and quarantine of close contacts, like yourself, who have ongoing exposure. These recommendations differ depending on your vaccination status. °What should I do if I have ongoing exposure to COVID-19 from someone I live with? °Recommendations for this situation depend on your vaccination status: °If you are not up to date on COVID-19 vaccines and have ongoing exposure to COVID-19, you should: °Begin quarantine immediately and  continue to quarantine throughout the isolation period of the person with COVID-19. °Continue to quarantine for an additional 5 days starting the day after the end of isolation for the person with COVID-19. °Get tested at least 5 days after the end of isolation of the infected person that lives with them. °If you test negative, you can leave the home but should continue to wear a well-fitting mask when around others at home and in public until 10 days after the end of isolation for the person with COVID-19. °Isolate immediately if you develop symptoms of COVID-19 or test positive. °If you are up to date with COVID-19 vaccines and have ongoing exposure to COVID-19, you should: °Get tested at least 5 days after your first exposure. A person with COVID-19 is considered infectious starting 2 days before they develop symptoms, or 2 days before the date of their positive test if they do not have symptoms. °Get tested again at least 5 days after the end of isolation for the person with COVID-19. °Wear a well-fitting mask when you are around the person with COVID-19, and do this throughout their isolation period. °Wear a well-fitting mask around others for 10 days after the infected person's isolation period ends. °Isolate immediately if you develop symptoms of COVID-19 or test positive. °What should I do if multiple people I live with test positive for COVID-19 at different times? °Recommendations for this situation depend on your vaccination status: °If you are not up to date with your COVID-19 vaccines, you should: °Quarantine throughout the isolation period of any infected person that you live with. °Continue to quarantine until 5 days after the end of isolation date for the most recently infected person that lives with you. For example, if   the last day of isolation of the person most recently infected with COVID-19 was June 30, the new 5-day quarantine period starts on July 1. °Get tested at least 5 days after the end  of isolation for the most recently infected person that lives with you. °Wear a well-fitting mask when you are around any person with COVID-19 while that person is in isolation. °Wear a well-fitting mask when you are around other people until 10 days after your last close contact. °Isolate immediately if you develop symptoms of COVID-19 or test positive. °If you are up to date with your COVID-19 vaccines, you should: °Get tested at least 5 days after your first exposure. A person with COVID-19 is considered infectious starting 2 days before they developed symptoms, or 2 days before the date of their positive test if they do not have symptoms. °Get tested again at least 5 days after the end of isolation for the most recently infected person that lives with you. °Wear a well-fitting mask when you are around any person with COVID-19 while that person is in isolation. °Wear a well-fitting mask around others for 10 days after the end of isolation for the most recently infected person that lives with you. For example, if the last day of isolation for the person most recently infected with COVID-19 was June 30, the new 10-day period to wear a well-fitting mask indoors in public starts on July 1. °Isolate immediately if you develop symptoms of COVID-19 or test positive. °I had COVID-19 and completed isolation. Do I have to quarantine or get tested if someone I live with gets COVID-19 shortly after I completed isolation? °No. If you recently completed isolation and someone that lives with you tests positive for the virus that causes COVID-19 shortly after the end of your isolation period, you do not have to quarantine or get tested as long as you do not develop new symptoms. Once all of the people that live together have completed isolation or quarantine, refer to the guidance below for new exposures to COVID-19. °If you had COVID-19 in the previous 90 days and then came into close contact with someone with COVID-19, you do  not have to quarantine or get tested if you do not have symptoms. But you should: °Wear a well-fitting mask indoors in public for 10 days after your last close contact. °Monitor for COVID-19 symptoms for 10 days from the date of your last close contact. °Isolate immediately and get tested if symptoms develop. °If more than 90 days have passed since your recovery from infection, follow CDC's recommendations for close contacts. These recommendations will differ depending on your vaccination status. °03/09/2021 °Content source: National Center for Immunization and Respiratory Diseases (NCIRD), Division of Viral Diseases °This information is not intended to replace advice given to you by your health care provider. Make sure you discuss any questions you have with your health care provider. °Document Revised: 07/13/2021 Document Reviewed: 07/13/2021 °Elsevier Patient Education © 2022 Elsevier Inc. ° °

## 2022-01-14 NOTE — Progress Notes (Signed)
Virtual Visit Consent   Molly Sanchez, you are scheduled for a virtual visit with Molly Sanchez, [place], a [facility] provider, today.     Just as with appointments in the office, your consent must be obtained to participate.  Your consent will be active for this visit and any virtual visit you may have with one of our providers in the next 365 days.     If you have a MyChart account, a copy of this consent can be sent to you electronically.  All virtual visits are billed to your insurance company just like a traditional visit in the office.    As this is a virtual visit, video technology does not allow for your provider to perform a traditional examination.  This may limit your provider's ability to fully assess your condition.  If your provider identifies any concerns that need to be evaluated in person or the need to arrange testing (such as labs, EKG, etc.), we will make arrangements to do so.     Although advances in technology are sophisticated, we cannot ensure that it will always work on either your end or our end.  If the connection with a video visit is poor, the visit may have to be switched to a telephone visit.  With either a video or telephone visit, we are not always able to ensure that we have a secure connection.     I need to obtain your verbal consent now.   Are you willing to proceed with your visit today? YES   Molly Sanchez has provided verbal consent on 01/14/2022 for a virtual visit (video or telephone).   Molly Hassell Done, FNP   Date: 01/14/2022 8:38 AM   Virtual Visit via Video Note   I, Molly Sanchez, connected with Molly Sanchez ([id number], 1971-07-14) on 01/14/22 at  8:45 AM EST by a video-enabled telemedicine application and verified that I am speaking with the correct person using two identifiers.  Location: Patient: Virtual Visit Location Patient: Home Provider: Virtual Visit Location Provider: Mobile   I discussed the  limitations of evaluation and management by telemedicine and the availability of in person appointments. The patient expressed understanding and agreed to proceed.    History of Present Illness: Molly Sanchez is a 51 y.o. who identifies as a female who was assigned female at birth, and is being seen today for covid.  HPI: URI  The current episode started in the past 7 days. The problem has been gradually worsening. The maximum temperature recorded prior to her arrival was 101 - 101.9 F. The fever has been present for 1 to 2 days. Associated symptoms include congestion, coughing, headaches, rhinorrhea and a sore throat. She has tried decongestant and acetaminophen for the symptoms. The treatment provided mild relief.  Tested positive for covid on Thursday. Review of Systems  HENT:  Positive for congestion, rhinorrhea and sore throat.   Respiratory:  Positive for cough.   Neurological:  Positive for headaches.   Problems:  Patient Active Problem List   Diagnosis Date Noted   Back pain    Goals of care, counseling/discussion 03/22/2018   Insulin resistance 02/13/2017   Class 2 obesity without serious comorbidity with body mass index (BMI) of 37.0 to 37.9 in adult 02/13/2017   Rheumatoid factor positive 07/07/2016   Routine general medical examination at a health care facility 02/17/2016   Encounter to establish care 01/22/2016   GERD (gastroesophageal reflux disease) 01/22/2016   Weight gain 01/22/2016  ADHD (attention deficit hyperactivity disorder) 01/22/2016   Depression 01/22/2016   Antiphospholipid antibody syndrome ([place]) 11/15/2012   Dysplasia of cervix, low grade (CIN 1)    Rosacea    Fibromyalgia    ANA positive    Raynaud's disease     Allergies: No Known Allergies Medications:  Current Outpatient Medications:    amphetamine-dextroamphetamine (ADDERALL) 20 MG tablet, , Disp: , Rfl: 0   amphetamine-dextroamphetamine (ADDERALL) 20 MG tablet, TAKE 1 TABLET BY MOUTH 4  TIMES DAILY, Disp: 360 tablet, Rfl: 0   Cholecalciferol (VITAMIN D3) 1.25 MG (50000 UT) CAPS, Take 1 capsule by mouth once a week, Disp: 12 capsule, Rfl: 4   COVID-19 At Home Antigen Test (CARESTART COVID-19 HOME TEST) KIT, Use as directed within package instructions., Disp: 4 each, Rfl: 0   DULoxetine (CYMBALTA) 60 MG capsule, TAKE 1 CAPSULE BY MOUTH ONCE A DAY, Disp: 90 capsule, Rfl: 1   Insulin Pen Needle (PEN NEEDLES) 31G X 6 MM MISC, Use one pen needle daily., Disp: 100 each, Rfl: 1   lisdexamfetamine (VYVANSE) 70 MG capsule, Take 1 capsule (70 mg total) by mouth every morning., Disp: 90 capsule, Rfl: 0   lisdexamfetamine (VYVANSE) 70 MG capsule, Take 1 capsule by mouth in the morning, Disp: 90 capsule, Rfl: 0   metFORMIN (GLUCOPHAGE XR) 500 MG 24 hr tablet, Take 1 tablet by mouth every evening as directed., Disp: 90 tablet, Rfl: 3   metroNIDAZOLE (METROGEL) 0.75 % vaginal gel, INSERT 1 APPLICATORFUL EVERY DAY BY VAGINAL ROUTE AT BEDTIME. (Patient not taking: Reported on 04/22/2021), Disp: 70 g, Rfl: 0   norethindrone (AYGESTIN) 5 MG tablet, TAKE 1 TABLET BY MOUTH 2 TIMES DAILY FOR 5 DAYS, Disp: 10 tablet, Rfl: 6   Semaglutide, 2 MG/DOSE, (OZEMPIC, 2 MG/DOSE,) 8 MG/3ML SOPN, Inject 2 mg into the skin once a week., Disp: 9 mL, Rfl: 2  Current Facility-Administered Medications:    0.9 %  sodium chloride infusion, 500 mL, Intravenous, Continuous, Pyrtle, Lajuan Lines, MD  Observations/Objective: Patient is well-developed, well-nourished in no acute distress.  Resting comfortably  at home.  Head is normocephalic, atraumatic.  No labored breathing.  Speech is clear and coherent with logical content.  Patient is alert and oriented at baseline.  Raspy voice Slight wet cough noted Nasal congestion  Assessment and Plan:  [name] in today with chief complaint of Covid Positive   1. Positive self-administered antigen test for COVID-19 1. Take meds as prescribed 2. Use a cool mist  humidifier especially during the winter months and when heat has been humid. 3. Use saline nose sprays frequently 4. Saline irrigations of the nose can be very helpful if Sanchez frequently.  * 4X daily for 1 week*  * Use of a nettie pot can be helpful with this. Follow directions with this* 5. Drink plenty of fluids 6. Keep thermostat turn down low 7.For any cough or congestion- nyquil and dayquil 8. For fever or aces or pains- take tylenol or ibuprofen appropriate for age and weight.  * for fevers greater than 101 orally you may alternate ibuprofen and tylenol every  3 hours.   Meds ordered this encounter  Medications   molnupiravir EUA (LAGEVRIO) 200 mg CAPS capsule    Sig: Take 4 capsules (800 mg total) by mouth 2 (two) times daily for 5 days.    Dispense:  40 capsule    Refill:  0    Order Specific Question:   Supervising Provider    Answer:  MILLER, BRIAN [[id number]]      Follow Up Instructions: I discussed the assessment and treatment plan with the patient. The patient was provided an opportunity to ask questions and all were answered. The patient agreed with the plan and demonstrated an understanding of the instructions.  A copy of instructions were sent to the patient via MyChart.  The patient was advised to call back or seek an in-person evaluation if the symptoms worsen or if the condition fails to improve as anticipated.  Time:  I spent 9  minutes with the patient via telehealth technology discussing the above problems/concerns.    Molly Hassell Done, FNP

## 2022-02-03 ENCOUNTER — Other Ambulatory Visit (HOSPITAL_COMMUNITY): Payer: Self-pay

## 2022-02-03 ENCOUNTER — Other Ambulatory Visit: Payer: Self-pay

## 2022-02-07 ENCOUNTER — Other Ambulatory Visit (HOSPITAL_COMMUNITY): Payer: Self-pay

## 2022-02-07 MED ORDER — VYVANSE 70 MG PO CAPS
ORAL_CAPSULE | ORAL | 0 refills | Status: DC
  Filled 2022-02-07: qty 90, 90d supply, fill #0

## 2022-02-10 ENCOUNTER — Other Ambulatory Visit (HOSPITAL_COMMUNITY): Payer: Self-pay

## 2022-03-05 ENCOUNTER — Encounter: Payer: Self-pay | Admitting: Family

## 2022-03-06 ENCOUNTER — Other Ambulatory Visit (HOSPITAL_COMMUNITY): Payer: Self-pay

## 2022-03-06 ENCOUNTER — Other Ambulatory Visit: Payer: Self-pay | Admitting: Family

## 2022-03-06 DIAGNOSIS — E669 Obesity, unspecified: Secondary | ICD-10-CM

## 2022-03-06 MED ORDER — WEGOVY 1.7 MG/0.75ML ~~LOC~~ SOAJ
1.7000 mg | SUBCUTANEOUS | 2 refills | Status: DC
Start: 2022-03-06 — End: 2022-05-12
  Filled 2022-03-06: qty 3, 28d supply, fill #0
  Filled 2022-05-05: qty 3, 28d supply, fill #1

## 2022-03-11 ENCOUNTER — Other Ambulatory Visit (HOSPITAL_COMMUNITY): Payer: Self-pay

## 2022-03-16 ENCOUNTER — Other Ambulatory Visit (HOSPITAL_COMMUNITY): Payer: Self-pay

## 2022-03-18 ENCOUNTER — Other Ambulatory Visit (HOSPITAL_COMMUNITY): Payer: Self-pay

## 2022-03-23 ENCOUNTER — Other Ambulatory Visit (HOSPITAL_COMMUNITY): Payer: Self-pay

## 2022-03-30 ENCOUNTER — Telehealth: Payer: Self-pay

## 2022-03-30 NOTE — Telephone Encounter (Signed)
Spoke to patient in regards to her [facility] Patient explained to her that it had been denied but I resubmitted the PA and was just waiting to ear back ?

## 2022-03-30 NOTE — Telephone Encounter (Signed)
Called patient and spoke to her about PA and explained that it had been denied once so I resubmitted it and just waiting to hear back. Patient verbalized understanding ?

## 2022-04-01 ENCOUNTER — Other Ambulatory Visit (HOSPITAL_COMMUNITY): Payer: Self-pay

## 2022-05-01 ENCOUNTER — Other Ambulatory Visit (HOSPITAL_COMMUNITY): Payer: Self-pay

## 2022-05-01 MED ORDER — VITAMIN D3 1.25 MG (50000 UT) PO CAPS
1.0000 | ORAL_CAPSULE | ORAL | 4 refills | Status: DC
  Filled 2022-05-01 – 2022-06-21 (×3): qty 12, 84d supply, fill #0
  Filled 2022-09-08: qty 12, 84d supply, fill #1
  Filled 2023-01-01: qty 12, 84d supply, fill #2

## 2022-05-01 MED ORDER — VYVANSE 70 MG PO CAPS
70.0000 mg | ORAL_CAPSULE | Freq: Every morning | ORAL | 0 refills | Status: DC
  Filled 2022-05-11: qty 90, 90d supply, fill #0

## 2022-05-05 ENCOUNTER — Other Ambulatory Visit (HOSPITAL_COMMUNITY): Payer: Self-pay

## 2022-05-11 ENCOUNTER — Other Ambulatory Visit (HOSPITAL_COMMUNITY): Payer: Self-pay

## 2022-05-12 ENCOUNTER — Other Ambulatory Visit (HOSPITAL_COMMUNITY): Payer: Self-pay

## 2022-05-12 ENCOUNTER — Other Ambulatory Visit (HOSPITAL_COMMUNITY)
Admission: RE | Admit: 2022-05-12 | Discharge: 2022-05-12 | Disposition: A | Discharge status: Home / Self Care | Payer: 59 | Source: Ambulatory Visit | Attending: Family | Admitting: Family

## 2022-05-12 ENCOUNTER — Encounter: Payer: Self-pay | Admitting: Family

## 2022-05-12 ENCOUNTER — Ambulatory Visit (INDEPENDENT_AMBULATORY_CARE_PROVIDER_SITE_OTHER): Payer: 59 | Admitting: Family

## 2022-05-12 VITALS — BP 124/86 | HR 67 | Temp 98.0°F | Ht 68.0 in | Wt 205.0 lb

## 2022-05-12 DIAGNOSIS — E669 Obesity, unspecified: Secondary | ICD-10-CM | POA: Diagnosis not present

## 2022-05-12 DIAGNOSIS — Z1231 Encounter for screening mammogram for malignant neoplasm of breast: Secondary | ICD-10-CM

## 2022-05-12 DIAGNOSIS — R102 Pelvic and perineal pain: Secondary | ICD-10-CM | POA: Diagnosis not present

## 2022-05-12 DIAGNOSIS — Z6837 Body mass index (BMI) 37.0-37.9, adult: Secondary | ICD-10-CM | POA: Diagnosis not present

## 2022-05-12 DIAGNOSIS — Z0000 Encounter for general adult medical examination without abnormal findings: Secondary | ICD-10-CM | POA: Diagnosis not present

## 2022-05-12 DIAGNOSIS — Z23 Encounter for immunization: Secondary | ICD-10-CM

## 2022-05-12 DIAGNOSIS — K5900 Constipation, unspecified: Secondary | ICD-10-CM | POA: Diagnosis not present

## 2022-05-12 LAB — URINALYSIS, ROUTINE W REFLEX MICROSCOPIC
Bilirubin Urine: NEGATIVE
Hgb urine dipstick: NEGATIVE
Ketones, ur: NEGATIVE
Leukocytes,Ua: NEGATIVE
Nitrite: NEGATIVE
RBC / HPF: NONE SEEN (ref 0–?)
Specific Gravity, Urine: 1.025 (ref 1.000–1.030)
Total Protein, Urine: NEGATIVE
Urine Glucose: NEGATIVE
Urobilinogen, UA: 0.2 (ref 0.0–1.0)
pH: 6 (ref 5.0–8.0)

## 2022-05-12 LAB — LIPID PANEL
Cholesterol: 196 mg/dL (ref 0–200)
HDL: 63 mg/dL (ref >39.00)
LDL Cholesterol: 122 mg/dL — ABNORMAL HIGH (ref 0–99)
NonHDL: 132.71
Total CHOL/HDL Ratio: 3
Triglycerides: 55 mg/dL (ref 0.0–149.0)
VLDL: 11 mg/dL (ref 0.0–40.0)

## 2022-05-12 LAB — FOLLICLE STIMULATING HORMONE: FSH: 24.7 m[IU]/mL

## 2022-05-12 MED ORDER — WEGOVY 2.4 MG/0.75ML ~~LOC~~ SOAJ
2.4000 mg | SUBCUTANEOUS | 2 refills | Status: DC
  Filled 2022-05-12: qty 3, 28d supply, fill #0
  Filled 2022-06-02 – 2022-06-21 (×2): qty 3, 28d supply, fill #1
  Filled 2022-07-08: qty 3, 28d supply, fill #2

## 2022-05-12 MED ORDER — METFORMIN HCL ER 500 MG PO TB24
ORAL_TABLET | ORAL | 3 refills | Status: DC
  Filled 2022-05-12: qty 90, 90d supply, fill #0
  Filled 2022-07-08 – 2022-08-01 (×3): qty 90, 90d supply, fill #1
  Filled 2022-11-03: qty 90, 90d supply, fill #2
  Filled 2023-02-04: qty 90, 90d supply, fill #3

## 2022-05-12 NOTE — Progress Notes (Signed)
Subjective:    Patient ID: Molly Sanchez, female    DOB: 18-Jan-1971, 51 y.o.   MRN: [id number]  CC: Molly Sanchez is a 51 y.o. female who presents today for physical exam.    HPI: She has lost 50  lbs on wegovy 1.9m. She has been pleased with weight loss however feels it has plateaued.    Menses stopped 5 months ago.  Occasional hot flash at night. She has complains of pelvic pain after intercourse.  For years she has a bowel movement once per week. This is normal for her. Endorses straining , hard stools. No blood in stool No abdominal pain, dysuria, diarrhea. No h/o uterine fibroids.       Colorectal Cancer Screening: UTD , 08/22/17. Repeat in 5 years Breast Cancer Screening: Mammogram due Cervical Cancer Screening: reported done by GYN 02/08/21. No records of this.         Tetanus - due          Labs: Screening labs today. Exercise: Gets regular exercise walking  Alcohol use:  none Smoking/tobacco use: Nonsmoker.     HISTORY:  Past Medical History:  Diagnosis Date   ADD (attention deficit disorder)    ANA positive    Antiphospholipid antibody syndrome ([facility]) 11/15/2012   Antiphospholipid antibody syndrome complicating pregnancy ([place]    Back pain    Breast fibroadenoma    history of   Clotting disorder ([facility])    with pregancy   Depression    Dysplasia of cervix, low grade (CIN 1) [id number]  Fatigue    Fibromyalgia    GERD (gastroesophageal reflux disease)    H/O varicella    Hx of blood clots    During pregnancy   Joint pain    Raynaud's disease    Rosacea    Vitamin D deficiency     Past Surgical History:  Procedure Laterality Date   CESAREAN SECTION     GYNECOLOGIC CRYOSURGERY     of cervix   right breast fibroadenoma removed     Family History  Problem Relation Age of Onset   Hypertension Mother    Stroke Mother    Heart attack Mother    Diabetes Mother    Hyperlipidemia Mother    Heart disease Mother    Depression Mother     Anxiety disorder Mother    Schizophrenia Mother    Obesity Mother    Mental illness Brother        schizophrenia   Colon cancer Neg Hx    Esophageal cancer Neg Hx    Rectal cancer Neg Hx    Stomach cancer Neg Hx    Pancreatic cancer Neg Hx    Thyroid cancer Neg Hx       ALLERGIES: Patient has no known allergies.  Current Outpatient Medications on File Prior to Visit  Medication Sig Dispense Refill   amphetamine-dextroamphetamine (ADDERALL) 20 MG tablet   0   Cholecalciferol (VITAMIN D3) 1.25 MG (50000 UT) CAPS Take 1 capsule by mouth once a week 12 capsule 4   Cholecalciferol (VITAMIN D3) 1.25 MG (50000 UT) CAPS Take 1 capsule by mouth once a week. 12 capsule 4   COVID-19 At Home Antigen Test (CARESTART COVID-19 HOME TEST) KIT Use as directed within package instructions. 4 each 0   lisdexamfetamine (VYVANSE) 70 MG capsule Take 1 capsule (70 mg total) by mouth every morning. 90 capsule 0   Insulin Pen Needle (PEN NEEDLES) 31G X 6 MM  MISC Use one pen needle daily. 100 each 1   lisdexamfetamine (VYVANSE) 70 MG capsule Take 1 capsule by mouth in the morning 90 capsule 0   lisdexamfetamine (VYVANSE) 70 MG capsule Take 1 capsule by mouth every morning. LAST FILL. CALL TO SCHED NEXT APPT! (Patient not taking: Reported on 05/12/2022) 90 capsule 0   lisdexamfetamine (VYVANSE) 70 MG capsule Take 1 capsule (70 mg total) by mouth every morning. (fill 05/07/22) (Patient not taking: Reported on 05/12/2022) 90 capsule 0   metroNIDAZOLE (METROGEL) 0.75 % vaginal gel INSERT 1 APPLICATORFUL EVERY DAY BY VAGINAL ROUTE AT BEDTIME. (Patient not taking: Reported on 04/22/2021) 70 g 0   norethindrone (AYGESTIN) 5 MG tablet TAKE 1 TABLET BY MOUTH 2 TIMES DAILY FOR 5 DAYS 10 tablet 6   Current Facility-Administered Medications on File Prior to Visit  Medication Dose Route Frequency Provider Last Rate Last Admin   0.9 %  sodium chloride infusion  500 mL Intravenous Continuous Pyrtle, Lajuan Lines, MD        Social  History   Tobacco Use   Smoking status: Never   Smokeless tobacco: Never  Vaping Use   Vaping Use: Never used  Substance Use Topics   Alcohol use: No    Alcohol/week: 1.0 standard drink    Types: 1 Glasses of wine per week    Comment: none    Drug use: No    Review of Systems  Constitutional:  Negative for chills, fever and unexpected weight change.  HENT:  Negative for congestion.   Respiratory:  Negative for cough.   Cardiovascular:  Negative for chest pain, palpitations and leg swelling.  Gastrointestinal:  Positive for constipation. Negative for abdominal pain, nausea and vomiting.  Genitourinary:  Positive for dyspareunia.  Musculoskeletal:  Negative for arthralgias and myalgias.  Skin:  Negative for rash.  Neurological:  Negative for headaches.  Hematological:  Negative for adenopathy.  Psychiatric/Behavioral:  Negative for confusion.      Objective:    BP 124/86 (BP Location: Left Arm, Patient Position: Sitting, Cuff Size: Normal)   Pulse 67   Temp 98 F (36.7 C) (Oral)   Ht 5' 8"  (1.727 m)   Wt 205 lb (93 kg)   LMP 04/18/2022   SpO2 99%   BMI 31.17 kg/m   BP Readings from Last 3 Encounters:  05/12/22 124/86  04/22/21 106/64  12/22/20 118/72   Wt Readings from Last 3 Encounters:  05/12/22 205 lb (93 kg)  04/22/21 254 lb 6.4 oz (115.4 kg)  12/22/20 252 lb 6.4 oz (114.5 kg)    Physical Exam Vitals reviewed.  Constitutional:      Appearance: Normal appearance. She is well-developed.  Eyes:     Conjunctiva/sclera: Conjunctivae normal.  Neck:     Thyroid: No thyroid mass or thyromegaly.  Cardiovascular:     Rate and Rhythm: Normal rate and regular rhythm.     Pulses: Normal pulses.     Heart sounds: Normal heart sounds.  Pulmonary:     Effort: Pulmonary effort is normal.     Breath sounds: Normal breath sounds. No wheezing, rhonchi or rales.  Chest:  Breasts:    Breasts are symmetrical.     Right: No inverted nipple, mass, nipple discharge, skin  change or tenderness.     Left: No inverted nipple, mass, nipple discharge, skin change or tenderness.  Abdominal:     General: Bowel sounds are normal. There is no distension.     Palpations: Abdomen is soft.  Abdomen is not rigid. There is no fluid wave or mass.     Tenderness: There is no abdominal tenderness. There is no guarding or rebound.  Genitourinary:    Cervix: No cervical motion tenderness, discharge or friability.     Uterus: Not enlarged, not fixed and not tender.      Adnexa:        Right: No mass, tenderness or fullness.         Left: No mass, tenderness or fullness.       Comments: Pap performed. No CMT. Unable to appreciated ovaries. Lymphadenopathy:     Head:     Right side of head: No submental, submandibular, tonsillar, preauricular, posterior auricular or occipital adenopathy.     Left side of head: No submental, submandibular, tonsillar, preauricular, posterior auricular or occipital adenopathy.     Cervical:     Right cervical: No superficial, deep or posterior cervical adenopathy.    Left cervical: No superficial, deep or posterior cervical adenopathy.     Upper Body:     Right upper body: No pectoral adenopathy.     Left upper body: No pectoral adenopathy.  Skin:    General: Skin is warm and dry.  Neurological:     Mental Status: She is alert.  Psychiatric:        Speech: Speech normal.        Behavior: Behavior normal.        Thought Content: Thought content normal.       Assessment & Plan:   Problem List Items Addressed This Visit       Other   Annual physical exam    Clinical breast exam performed today.  Patient will schedule mammogram.  She will call in August of this year so that I may also order colonoscopy at that time.  Encouraged continued exercise       Class 2 obesity without serious comorbidity with body mass index (BMI) of 37.0 to 37.9 in adult   Relevant Medications   metFORMIN (GLUCOPHAGE-XR) 500 MG 24 hr tablet    Semaglutide-Weight Management (WEGOVY) 2.4 MG/0.75ML SOAJ   Constipation    Chronic.  Advised patient to start stool softener such as Colace . She may add in Metamucil symptoms not relieved.  If symptoms not relieved by these 2 measures I have asked her to call the office we can start further evaluation.       Pelvic pain    Pelvic pain after intercourse.  Benign pelvic exam today.  Counseled her to let me know if vaginal bleeding recurs as I explained that once she has cessation of menses in menopause, spotting or bleeding after would warrant evaluation. pending pelvic ultrasound.        Relevant Orders   FSH   hCG, serum, qualitative   US PELVIC COMPLETE WITH TRANSVAGINAL   Urinalysis, Routine w reflex microscopic   Routine general medical examination at a health care facility - Primary   Relevant Orders   CBC with Differential/Platelet   Comprehensive metabolic panel   Hemoglobin A1c   Lipid panel   TSH   VITAMIN D 25 Hydroxy (Vit-D Deficiency, Fractures)   Cytology - PAP   Other Visit Diagnoses     Encounter for screening mammogram for malignant neoplasm of breast       Relevant Orders   MM 3D SCREEN BREAST BILATERAL   Need for DTaP vaccine       Relevant Orders   Tdap vaccine greater  than or equal to 7yo IM (Completed)        I have discontinued Pecola B. Lundy's DULoxetine and Wegovy. I am also having her start on Wegovy. Additionally, I am having her maintain her amphetamine-dextroamphetamine, Pen Needles, metroNIDAZOLE, norethindrone, Vitamin D3, Vyvanse, Carestart COVID-19 Home Test, Vyvanse, Vyvanse, Vitamin D3, Vyvanse, and metFORMIN. We will continue to administer sodium chloride.   Meds ordered this encounter  Medications   metFORMIN (GLUCOPHAGE-XR) 500 MG 24 hr tablet    Sig: Take 1 tablet by mouth every evening as directed.    Dispense:  90 tablet    Refill:  3    Order Specific Question:   Supervising Provider    Answer:   Crecencio Mc [[id number]]    Semaglutide-Weight Management (WEGOVY) 2.4 MG/0.75ML SOAJ    Sig: Inject 2.4 mg into the skin once a week.    Dispense:  3 mL    Refill:  2    Order Specific Question:   Supervising Provider    Answer:   Crecencio Mc [[id number]]    Return precautions given.   Risks, benefits, and alternatives of the medications and treatment plan prescribed today were discussed, and patient expressed understanding.   Education regarding symptom management and diagnosis given to patient on AVS.   Continue to follow with Burnard Hawthorne, FNP for routine health maintenance.   [name] and I agreed with plan.   Mable Paris, FNP

## 2022-05-12 NOTE — Assessment & Plan Note (Addendum)
Pelvic pain after intercourse.  Benign pelvic exam today.  Counseled her to let me know if vaginal bleeding recurs as I explained that once she has cessation of menses in menopause, spotting or bleeding after would warrant evaluation. pending pelvic ultrasound.

## 2022-05-12 NOTE — Patient Instructions (Addendum)
Call me in August so I can order colonoscopy which is due 08/2022.  I ordered a transvaginal ultrasound.  Please let me know if pelvic pain persists or certainly if you develop new symptoms. Please start colace to regulate bowels. May also start metamucil.   Please call  and schedule your 3D mammogram and /or bone density scan as we discussed.   [facility]  ( new location in 2023)  [address], [place], [place] [address]  [place], [place]   Health Maintenance, Female Adopting a healthy lifestyle and getting preventive care are important in promoting health and wellness. Ask your health care provider about: The right schedule for you to have regular tests and exams. Things you can do on your own to prevent diseases and keep yourself healthy. What should I know about diet, weight, and exercise? Eat a healthy diet  Eat a diet that includes plenty of vegetables, fruits, low-fat dairy products, and lean protein. Do not eat a lot of foods that are high in solid fats, added sugars, or sodium. Maintain a healthy weight Body mass index (BMI) is used to identify weight problems. It estimates body fat based on height and weight. Your health care provider can help determine your BMI and help you achieve or maintain a healthy weight. Get regular exercise Get regular exercise. This is one of the most important things you can do for your health. Most adults should: Exercise for at least 150 minutes each week. The exercise should increase your heart rate and make you sweat (moderate-intensity exercise). Do strengthening exercises at least twice a week. This is in addition to the moderate-intensity exercise. Spend less time sitting. Even light physical activity can be beneficial. Watch cholesterol and blood lipids Have your blood tested for lipids and cholesterol at 50 years of age, then have this test every 5 years. Have your cholesterol levels checked more  often if: Your lipid or cholesterol levels are high. You are older than 50 years of age. You are at high risk for heart disease. What should I know about cancer screening? Depending on your health history and family history, you may need to have cancer screening at various ages. This may include screening for: Breast cancer. Cervical cancer. Colorectal cancer. Skin cancer. Lung cancer. What should I know about heart disease, diabetes, and high blood pressure? Blood pressure and heart disease High blood pressure causes heart disease and increases the risk of stroke. This is more likely to develop in people who have high blood pressure readings or are overweight. Have your blood pressure checked: Every 3-5 years if you are 20-7 years of age. Every year if you are 41 years old or older. Diabetes Have regular diabetes screenings. This checks your fasting blood sugar level. Have the screening done: Once every three years after age 32 if you are at a normal weight and have a low risk for diabetes. More often and at a younger age if you are overweight or have a high risk for diabetes. What should I know about preventing infection? Hepatitis B If you have a higher risk for hepatitis B, you should be screened for this virus. Talk with your health care provider to find out if you are at risk for hepatitis B infection. Hepatitis C Testing is recommended for: Everyone born from [birth year] through 1964. Anyone with known risk factors for hepatitis C. Sexually transmitted infections (STIs) Get screened for STIs, including gonorrhea and chlamydia, if: You are sexually  active and are younger than 50 years of age. You are older than 50 years of age and your health care provider tells you that you are at risk for this type of infection. Your sexual activity has changed since you were last screened, and you are at increased risk for chlamydia or gonorrhea. Ask your health care provider if you are at  risk. Ask your health care provider about whether you are at high risk for HIV. Your health care provider may recommend a prescription medicine to help prevent HIV infection. If you choose to take medicine to prevent HIV, you should first get tested for HIV. You should then be tested every 3 months for as long as you are taking the medicine. Pregnancy If you are about to stop having your period (premenopausal) and you may become pregnant, seek counseling before you get pregnant. Take 400 to 800 micrograms (mcg) of folic acid every day if you become pregnant. Ask for birth control (contraception) if you want to prevent pregnancy. Osteoporosis and menopause Osteoporosis is a disease in which the bones lose minerals and strength with aging. This can result in bone fractures. If you are 47 years old or older, or if you are at risk for osteoporosis and fractures, ask your health care provider if you should: Be screened for bone loss. Take a calcium or vitamin D supplement to lower your risk of fractures. Be given hormone replacement therapy (HRT) to treat symptoms of menopause. Follow these instructions at home: Alcohol use Do not drink alcohol if: Your health care provider tells you not to drink. You are pregnant, may be pregnant, or are planning to become pregnant. If you drink alcohol: Limit how much you have to: 0-1 drink a day. Know how much alcohol is in your drink. In the [place] one drink equals one 12 oz bottle of beer (355 mL), one 5 oz glass of wine (148 mL), or one 1 oz glass of hard liquor (44 mL). Lifestyle Do not use any products that contain nicotine or tobacco. These products include cigarettes, chewing tobacco, and vaping devices, such as e-cigarettes. If you need help quitting, ask your health care provider. Do not use street drugs. Do not share needles. Ask your health care provider for help if you need support or information about quitting drugs. General instructions Schedule  regular health, dental, and eye exams. Stay current with your vaccines. Tell your health care provider if: You often feel depressed. You have ever been abused or do not feel safe at home. Summary Adopting a healthy lifestyle and getting preventive care are important in promoting health and wellness. Follow your health care provider's instructions about healthy diet, exercising, and getting tested or screened for diseases. Follow your health care provider's instructions on monitoring your cholesterol and blood pressure. This information is not intended to replace advice given to you by your health care provider. Make sure you discuss any questions you have with your health care provider. Document Revised: 04/18/2021 Document Reviewed: 04/18/2021 Elsevier Patient Education  [place].

## 2022-05-12 NOTE — Assessment & Plan Note (Signed)
Clinical breast exam performed today.  Patient will schedule mammogram.  She will call in August of this year so that I may also order colonoscopy at that time.  Encouraged continued exercise

## 2022-05-12 NOTE — Assessment & Plan Note (Signed)
Chronic.  Advised patient to start stool softener such as Colace . She may add in Metamucil symptoms not relieved.  If symptoms not relieved by these 2 measures I have asked her to call the office we can start further evaluation.

## 2022-05-13 LAB — HCG, SERUM, QUALITATIVE: Preg, Serum: NEGATIVE

## 2022-05-15 LAB — CYTOLOGY - PAP
Chlamydia: NEGATIVE
Comment: NEGATIVE
Comment: NEGATIVE
Comment: NORMAL
Diagnosis: NEGATIVE
High risk HPV: NEGATIVE
Neisseria Gonorrhea: NEGATIVE

## 2022-05-17 ENCOUNTER — Ambulatory Visit
Admission: RE | Admit: 2022-05-17 | Discharge: 2022-05-17 | Disposition: A | Discharge status: Home / Self Care | Payer: 59 | Source: Ambulatory Visit | Attending: Family | Admitting: Family

## 2022-05-17 ENCOUNTER — Encounter: Payer: Self-pay | Admitting: Family

## 2022-05-17 DIAGNOSIS — N852 Hypertrophy of uterus: Secondary | ICD-10-CM | POA: Diagnosis not present

## 2022-05-17 DIAGNOSIS — N858 Other specified noninflammatory disorders of uterus: Secondary | ICD-10-CM | POA: Diagnosis not present

## 2022-05-17 DIAGNOSIS — R102 Pelvic and perineal pain: Secondary | ICD-10-CM | POA: Diagnosis not present

## 2022-05-26 ENCOUNTER — Other Ambulatory Visit (INDEPENDENT_AMBULATORY_CARE_PROVIDER_SITE_OTHER): Payer: 59

## 2022-05-26 DIAGNOSIS — Z0000 Encounter for general adult medical examination without abnormal findings: Secondary | ICD-10-CM

## 2022-05-26 LAB — CBC WITH DIFFERENTIAL/PLATELET
Basophils Absolute: 0 10*3/uL (ref 0.0–0.1)
Basophils Relative: 0.7 % (ref 0.0–3.0)
Eosinophils Absolute: 0.1 10*3/uL (ref 0.0–0.7)
Eosinophils Relative: 1.2 % (ref 0.0–5.0)
HCT: 38.3 % (ref 36.0–46.0)
Hemoglobin: 13 g/dL (ref 12.0–15.0)
Lymphocytes Relative: 26.7 % (ref 12.0–46.0)
Lymphs Abs: 1.5 10*3/uL (ref 0.7–4.0)
MCHC: 33.8 g/dL (ref 30.0–36.0)
MCV: 93.4 fl (ref 78.0–100.0)
Monocytes Absolute: 0.5 10*3/uL (ref 0.1–1.0)
Monocytes Relative: 9.5 % (ref 3.0–12.0)
Neutro Abs: 3.5 10*3/uL (ref 1.4–7.7)
Neutrophils Relative %: 61.9 % (ref 43.0–77.0)
Platelets: 268 10*3/uL (ref 150.0–400.0)
RBC: 4.1 Mil/uL (ref 3.87–5.11)
RDW: 14.1 % (ref 11.5–15.5)
WBC: 5.7 10*3/uL (ref 4.0–10.5)

## 2022-05-26 LAB — COMPREHENSIVE METABOLIC PANEL
ALT: 4 U/L (ref 0–35)
AST: 11 U/L (ref 0–37)
Albumin: 3.9 g/dL (ref 3.5–5.2)
Alkaline Phosphatase: 52 U/L (ref 39–117)
BUN: 18 mg/dL (ref 6–23)
CO2: 28 mEq/L (ref 19–32)
Calcium: 8.9 mg/dL (ref 8.4–10.5)
Chloride: 104 mEq/L (ref 96–112)
Creatinine, Ser: 0.75 mg/dL (ref 0.40–1.20)
GFR: 92.89 mL/min (ref >60.00)
Glucose, Bld: 78 mg/dL (ref 70–99)
Potassium: 4 mEq/L (ref 3.5–5.1)
Sodium: 140 mEq/L (ref 135–145)
Total Bilirubin: 1.1 mg/dL (ref 0.2–1.2)
Total Protein: 6.6 g/dL (ref 6.0–8.3)

## 2022-05-26 LAB — VITAMIN D 25 HYDROXY (VIT D DEFICIENCY, FRACTURES): VITD: 55.37 ng/mL (ref 30.00–100.00)

## 2022-05-26 LAB — HEMOGLOBIN A1C: Hgb A1c MFr Bld: 5.1 % (ref 4.6–6.5)

## 2022-05-26 LAB — TSH: TSH: 1.4 u[IU]/mL (ref 0.35–5.50)

## 2022-06-03 ENCOUNTER — Other Ambulatory Visit (HOSPITAL_COMMUNITY): Payer: Self-pay

## 2022-06-05 ENCOUNTER — Other Ambulatory Visit (HOSPITAL_COMMUNITY): Payer: Self-pay

## 2022-06-06 ENCOUNTER — Other Ambulatory Visit (HOSPITAL_COMMUNITY): Payer: Self-pay

## 2022-06-17 ENCOUNTER — Other Ambulatory Visit (HOSPITAL_COMMUNITY): Payer: Self-pay

## 2022-06-21 ENCOUNTER — Other Ambulatory Visit (HOSPITAL_COMMUNITY): Payer: Self-pay

## 2022-07-08 ENCOUNTER — Other Ambulatory Visit (HOSPITAL_COMMUNITY): Payer: Self-pay

## 2022-07-13 ENCOUNTER — Other Ambulatory Visit (HOSPITAL_COMMUNITY): Payer: Self-pay

## 2022-07-17 ENCOUNTER — Other Ambulatory Visit (HOSPITAL_COMMUNITY): Payer: Self-pay

## 2022-07-18 ENCOUNTER — Other Ambulatory Visit (HOSPITAL_COMMUNITY): Payer: Self-pay

## 2022-08-01 ENCOUNTER — Other Ambulatory Visit (HOSPITAL_COMMUNITY): Payer: Self-pay

## 2022-08-01 ENCOUNTER — Other Ambulatory Visit: Payer: Self-pay | Admitting: Family

## 2022-08-01 DIAGNOSIS — E669 Obesity, unspecified: Secondary | ICD-10-CM

## 2022-08-02 ENCOUNTER — Other Ambulatory Visit (HOSPITAL_COMMUNITY): Payer: Self-pay

## 2022-08-02 MED ORDER — WEGOVY 2.4 MG/0.75ML ~~LOC~~ SOAJ
2.4000 mg | SUBCUTANEOUS | 2 refills | Status: DC
  Filled 2022-08-02 – 2022-08-09 (×2): qty 3, 28d supply, fill #0
  Filled 2022-09-04: qty 3, 28d supply, fill #1
  Filled 2022-10-03: qty 3, 28d supply, fill #2

## 2022-08-04 ENCOUNTER — Other Ambulatory Visit (HOSPITAL_COMMUNITY): Payer: Self-pay

## 2022-08-08 ENCOUNTER — Other Ambulatory Visit (HOSPITAL_COMMUNITY): Payer: Self-pay

## 2022-08-08 MED ORDER — VYVANSE 70 MG PO CAPS
ORAL_CAPSULE | ORAL | 0 refills | Status: DC
Start: 2022-08-08 — End: 2022-08-09
  Filled 2022-08-08: qty 90, 90d supply, fill #0

## 2022-08-09 ENCOUNTER — Other Ambulatory Visit (HOSPITAL_COMMUNITY): Payer: Self-pay

## 2022-08-09 MED ORDER — VYVANSE 60 MG PO CAPS
ORAL_CAPSULE | ORAL | 0 refills | Status: DC
  Filled 2022-08-09: qty 30, 30d supply, fill #0

## 2022-08-29 DIAGNOSIS — N939 Abnormal uterine and vaginal bleeding, unspecified: Secondary | ICD-10-CM | POA: Diagnosis not present

## 2022-08-29 DIAGNOSIS — N951 Menopausal and female climacteric states: Secondary | ICD-10-CM | POA: Diagnosis not present

## 2022-08-29 DIAGNOSIS — N898 Other specified noninflammatory disorders of vagina: Secondary | ICD-10-CM | POA: Diagnosis not present

## 2022-09-04 ENCOUNTER — Other Ambulatory Visit (HOSPITAL_COMMUNITY): Payer: Self-pay

## 2022-09-04 MED ORDER — FLUCONAZOLE 150 MG PO TABS
150.0000 mg | ORAL_TABLET | Freq: Once | ORAL | 0 refills | Status: AC
  Filled 2022-09-04: qty 1, 1d supply, fill #0

## 2022-09-05 ENCOUNTER — Other Ambulatory Visit (HOSPITAL_COMMUNITY): Payer: Self-pay

## 2022-09-07 ENCOUNTER — Other Ambulatory Visit (HOSPITAL_COMMUNITY): Payer: Self-pay

## 2022-09-07 MED ORDER — LISDEXAMFETAMINE DIMESYLATE 70 MG PO CAPS
70.0000 mg | ORAL_CAPSULE | ORAL | 0 refills | Status: DC
  Filled 2022-09-08: qty 90, 90d supply, fill #0

## 2022-09-08 ENCOUNTER — Other Ambulatory Visit (HOSPITAL_COMMUNITY): Payer: Self-pay

## 2022-09-09 ENCOUNTER — Other Ambulatory Visit (HOSPITAL_COMMUNITY): Payer: Self-pay

## 2022-09-11 ENCOUNTER — Other Ambulatory Visit (HOSPITAL_COMMUNITY): Payer: Self-pay

## 2022-09-11 ENCOUNTER — Encounter: Payer: Self-pay | Admitting: Family

## 2022-09-11 DIAGNOSIS — N950 Postmenopausal bleeding: Secondary | ICD-10-CM | POA: Diagnosis not present

## 2022-09-12 ENCOUNTER — Other Ambulatory Visit (HOSPITAL_COMMUNITY): Payer: Self-pay

## 2022-09-12 MED ORDER — AMOXICILLIN 500 MG PO CAPS
500.0000 mg | ORAL_CAPSULE | Freq: Three times a day (TID) | ORAL | 0 refills | Status: DC
  Filled 2022-09-12: qty 21, 7d supply, fill #0

## 2022-09-12 MED ORDER — IBUPROFEN 600 MG PO TABS
ORAL_TABLET | ORAL | 0 refills | Status: DC
  Filled 2022-09-12: qty 30, 7d supply, fill #0

## 2022-09-14 ENCOUNTER — Other Ambulatory Visit (HOSPITAL_COMMUNITY): Payer: Self-pay

## 2022-09-28 ENCOUNTER — Other Ambulatory Visit (HOSPITAL_COMMUNITY): Payer: Self-pay

## 2022-09-28 MED ORDER — CHLORHEXIDINE GLUCONATE 0.12 % MT SOLN
OROMUCOSAL | 0 refills | Status: DC
  Filled 2022-09-28: qty 473, 16d supply, fill #0
  Filled 2022-09-28: qty 473, 15d supply, fill #0

## 2022-10-03 ENCOUNTER — Ambulatory Visit
Admission: RE | Admit: 2022-10-03 | Discharge: 2022-10-03 | Disposition: A | Discharge status: Home / Self Care | Payer: 59 | Source: Ambulatory Visit | Attending: Family | Admitting: Family

## 2022-10-03 DIAGNOSIS — Z1231 Encounter for screening mammogram for malignant neoplasm of breast: Secondary | ICD-10-CM

## 2022-10-11 ENCOUNTER — Other Ambulatory Visit (HOSPITAL_COMMUNITY): Payer: Self-pay

## 2022-10-11 MED ORDER — IBUPROFEN 600 MG PO TABS
600.0000 mg | ORAL_TABLET | ORAL | 0 refills | Status: DC
  Filled 2022-10-11: qty 10, 2d supply, fill #0

## 2022-10-26 DIAGNOSIS — N950 Postmenopausal bleeding: Secondary | ICD-10-CM | POA: Diagnosis not present

## 2022-11-03 ENCOUNTER — Other Ambulatory Visit: Payer: Self-pay | Admitting: Family

## 2022-11-03 ENCOUNTER — Other Ambulatory Visit (HOSPITAL_COMMUNITY): Payer: Self-pay

## 2022-11-03 DIAGNOSIS — E669 Obesity, unspecified: Secondary | ICD-10-CM

## 2022-11-06 ENCOUNTER — Other Ambulatory Visit (HOSPITAL_COMMUNITY): Payer: Self-pay

## 2022-11-06 MED ORDER — WEGOVY 2.4 MG/0.75ML ~~LOC~~ SOAJ
2.4000 mg | SUBCUTANEOUS | 2 refills | Status: DC
  Filled 2022-11-06: qty 3, 28d supply, fill #0
  Filled 2022-12-05: qty 3, 28d supply, fill #1
  Filled 2023-01-01: qty 3, 28d supply, fill #2

## 2022-11-11 ENCOUNTER — Other Ambulatory Visit (HOSPITAL_COMMUNITY): Payer: Self-pay

## 2022-11-30 ENCOUNTER — Other Ambulatory Visit (HOSPITAL_COMMUNITY): Payer: Self-pay

## 2022-11-30 MED ORDER — TRINTELLIX 10 MG PO TABS
10.0000 mg | ORAL_TABLET | Freq: Every day | ORAL | 0 refills | Status: DC
  Filled 2022-11-30: qty 90, 90d supply, fill #0

## 2022-12-05 ENCOUNTER — Other Ambulatory Visit: Payer: Self-pay

## 2022-12-05 ENCOUNTER — Other Ambulatory Visit (HOSPITAL_COMMUNITY): Payer: Self-pay

## 2022-12-14 ENCOUNTER — Other Ambulatory Visit (HOSPITAL_COMMUNITY): Payer: Self-pay

## 2022-12-14 MED ORDER — LISDEXAMFETAMINE DIMESYLATE 70 MG PO CAPS
70.0000 mg | ORAL_CAPSULE | Freq: Every morning | ORAL | 0 refills | Status: DC
  Filled 2022-12-14: qty 90, 90d supply, fill #0

## 2023-01-01 ENCOUNTER — Other Ambulatory Visit: Payer: Self-pay

## 2023-01-01 ENCOUNTER — Encounter: Payer: Self-pay | Admitting: Family

## 2023-01-01 NOTE — Telephone Encounter (Signed)
Spoke to pt in regards to getting the shingles vaccine.Pt stated that she has had chicken pox before. Pt stated that she will think about getting it  due to possible side effects and give [place] a call back

## 2023-01-18 ENCOUNTER — Other Ambulatory Visit (HOSPITAL_COMMUNITY): Payer: Self-pay

## 2023-01-18 MED ORDER — AMOXICILLIN 500 MG PO CAPS
500.0000 mg | ORAL_CAPSULE | Freq: Three times a day (TID) | ORAL | 0 refills | Status: DC
  Filled 2023-01-18: qty 21, 7d supply, fill #0

## 2023-01-19 ENCOUNTER — Encounter: Payer: Self-pay | Admitting: Internal Medicine

## 2023-01-27 ENCOUNTER — Other Ambulatory Visit (HOSPITAL_COMMUNITY): Payer: Self-pay

## 2023-02-04 ENCOUNTER — Other Ambulatory Visit: Payer: Self-pay | Admitting: Family

## 2023-02-04 DIAGNOSIS — E669 Obesity, unspecified: Secondary | ICD-10-CM

## 2023-02-05 ENCOUNTER — Other Ambulatory Visit: Payer: Self-pay

## 2023-02-05 ENCOUNTER — Other Ambulatory Visit (HOSPITAL_COMMUNITY): Payer: Self-pay

## 2023-02-05 MED ORDER — WEGOVY 2.4 MG/0.75ML ~~LOC~~ SOAJ
2.4000 mg | SUBCUTANEOUS | 2 refills | Status: DC
  Filled 2023-02-05: qty 3, 28d supply, fill #0
  Filled 2023-03-02: qty 3, 28d supply, fill #1
  Filled 2023-04-01 – 2023-04-09 (×2): qty 3, 28d supply, fill #2

## 2023-03-08 ENCOUNTER — Other Ambulatory Visit: Payer: Self-pay

## 2023-03-08 ENCOUNTER — Other Ambulatory Visit (HOSPITAL_COMMUNITY): Payer: Self-pay

## 2023-03-09 ENCOUNTER — Other Ambulatory Visit (HOSPITAL_COMMUNITY): Payer: Self-pay

## 2023-03-09 MED ORDER — TRINTELLIX 10 MG PO TABS
10.0000 mg | ORAL_TABLET | Freq: Every day | ORAL | 0 refills | Status: AC
  Filled 2023-06-26: qty 90, 90d supply, fill #0

## 2023-03-09 MED ORDER — TRINTELLIX 10 MG PO TABS
10.0000 mg | ORAL_TABLET | Freq: Every day | ORAL | 0 refills | Status: DC
  Filled 2023-03-09 – 2023-03-19 (×2): qty 90, 90d supply, fill #0

## 2023-03-19 ENCOUNTER — Other Ambulatory Visit (HOSPITAL_COMMUNITY): Payer: Self-pay

## 2023-03-23 ENCOUNTER — Other Ambulatory Visit (HOSPITAL_COMMUNITY): Payer: Self-pay

## 2023-03-23 MED ORDER — LISDEXAMFETAMINE DIMESYLATE 70 MG PO CAPS
70.0000 mg | ORAL_CAPSULE | Freq: Every morning | ORAL | 0 refills | Status: DC
  Filled 2023-03-23: qty 90, 90d supply, fill #0

## 2023-03-24 ENCOUNTER — Other Ambulatory Visit (HOSPITAL_COMMUNITY): Payer: Self-pay

## 2023-04-09 ENCOUNTER — Other Ambulatory Visit (HOSPITAL_COMMUNITY): Payer: Self-pay

## 2023-04-10 ENCOUNTER — Other Ambulatory Visit (HOSPITAL_COMMUNITY): Payer: Self-pay

## 2023-04-10 ENCOUNTER — Encounter: Payer: Self-pay | Admitting: Family

## 2023-04-10 ENCOUNTER — Ambulatory Visit: Payer: Commercial Managed Care - PPO | Admitting: Family

## 2023-04-10 VITALS — BP 122/78 | HR 76 | Temp 97.4°F | Ht 67.0 in | Wt 169.0 lb

## 2023-04-10 DIAGNOSIS — E669 Obesity, unspecified: Secondary | ICD-10-CM | POA: Diagnosis not present

## 2023-04-10 DIAGNOSIS — Z6837 Body mass index (BMI) 37.0-37.9, adult: Secondary | ICD-10-CM

## 2023-04-10 DIAGNOSIS — M549 Dorsalgia, unspecified: Secondary | ICD-10-CM

## 2023-04-10 MED ORDER — ZEPBOUND 5 MG/0.5ML ~~LOC~~ SOAJ
5.0000 mg | SUBCUTANEOUS | 2 refills | Status: DC
  Filled 2023-04-10 – 2023-04-18 (×2): qty 2, 28d supply, fill #0

## 2023-04-10 NOTE — Assessment & Plan Note (Signed)
No alarm features.  Back pain been ongoing for over a year.  We discussed ergonomics of sitting and typing 8 hours a day.  Advised to start icing regimen.  Referral to physical therapy.  Pending x-ray

## 2023-04-10 NOTE — Progress Notes (Signed)
Assessment & Plan:  Class 2 obesity without serious comorbidity with body mass index (BMI) of 37.0 to 37.9 in adult, unspecified obesity type Assessment & Plan: Congratulated patient on significant weight loss.  She has nearly obtained a normal BMI.  At this point the goal is to maintain weight.  Insurance does not cover [employer].  Zepbound likely will be cheaper for her.  Start moderate dose Zepbound 5 mg.  Counseled on mechanism of action, side effects.  Orders: -     Zepbound; Inject 5 mg into the skin once a week.  Dispense: 2 mL; Refill: 2  Mid back pain Assessment & Plan: No alarm features.  Back pain been ongoing for over a year.  We discussed ergonomics of sitting and typing 8 hours a day.  Advised to start icing regimen.  Referral to physical therapy.  Pending x-ray  Orders: -     Ambulatory referral to Physical Therapy -     DG Lumbar Spine Complete; Future -     DG Thoracic Spine W/Swimmers; Future -     Urinalysis, Routine w reflex microscopic     Return precautions given.   Risks, benefits, and alternatives of the medications and treatment plan prescribed today were discussed, and patient expressed understanding.   Education regarding symptom management and diagnosis given to patient on AVS either electronically or printed.  No follow-ups on file.  Rennie Plowman, FNP  Subjective:    Patient ID: Molly Sanchez, female    DOB: [birth year], 53 y.o.   MRN: [id number]  CC: Molly Sanchez is a 52 y.o. female who presents today for an acute visit.    HPI: Complains of mid back pain, episodic, started one year ago.   She describes pain as 'random'. More noticeable when she is stressed.   No dysuria, constipation, nausea, cp, numbness.   Couple of weeks ago, for 2 weeks, episode was more intense with ibuprofen, tylenol and stretches with resolution.   Pain feels 'muscular'. Worse after playing basketball.  She is typing for 8 hours/day.   She is pleased  with wegovy and lost almost 100lbs.  Low back pain has resolved with weight loss   X-ray lumbar spine 09/19/2020 L5-S1 degenerative disc disease  Allergies: Patient has no known allergies. Current Outpatient Medications on File Prior to Visit  Medication Sig Dispense Refill   amoxicillin (AMOXIL) 500 MG capsule Take 1 capsule (500 mg total) by mouth every 8 (eight) hours until gone 21 capsule 0   chlorhexidine (PERIDEX) 0.12 % solution after brushing, rinse with one capful for one minute twice a day then spit out 473 mL 0   Cholecalciferol (VITAMIN D3) 1.25 MG (50000 UT) CAPS Take 1 capsule by mouth once a week 12 capsule 4   Cholecalciferol (VITAMIN D3) 1.25 MG (50000 UT) CAPS Take 1 capsule by mouth once a week. 12 capsule 4   COVID-19 At Home Antigen Test (CARESTART COVID-19 HOME TEST) KIT Use as directed within package instructions. 4 each 0   ibuprofen (ADVIL) 600 MG tablet Take 1 tablet (600 mg total) by mouth every 4 (four) to 6 (six) hours. 10 tablet 0   lisdexamfetamine (VYVANSE) 60 MG capsule Take 1 capsule by mouth every morning. 30 capsule 0   lisdexamfetamine (VYVANSE) 70 MG capsule Take 1 capsule (70 mg total) by mouth every morning. 90 capsule 0   lisdexamfetamine (VYVANSE) 70 MG capsule Take 1 capsule by mouth every morning. LAST FILL. CALL TO SCHED NEXT APPT!  90 capsule 0   lisdexamfetamine (VYVANSE) 70 MG capsule Take 1 capsule (70 mg total) by mouth every morning. (fill 05/07/22) 90 capsule 0   lisdexamfetamine (VYVANSE) 70 MG capsule Take 1 capsule (70 mg total) by mouth every morning. 90 capsule 0   metFORMIN (GLUCOPHAGE-XR) 500 MG 24 hr tablet Take 1 tablet by mouth every evening as directed. 90 tablet 3   vortioxetine HBr (TRINTELLIX) 10 MG TABS tablet Take 1 tablet (10 mg total) by mouth daily. 90 tablet 0   amoxicillin (AMOXIL) 500 MG capsule Take 1 capsule (500 mg total) by mouth every 8 (eight) hours until gone 21 capsule 0   amphetamine-dextroamphetamine (ADDERALL)  20 MG tablet   0   Insulin Pen Needle (PEN NEEDLES) 31G X 6 MM MISC Use one pen needle daily. 100 each 1   lisdexamfetamine (VYVANSE) 70 MG capsule Take 1 capsule by mouth in the morning 90 capsule 0   lisdexamfetamine (VYVANSE) 70 MG capsule Take 1 capsule (70 mg total) by mouth every morning. 90 capsule 0   metroNIDAZOLE (METROGEL) 0.75 % vaginal gel INSERT 1 APPLICATORFUL EVERY DAY BY VAGINAL ROUTE AT BEDTIME. (Patient not taking: Reported on 04/22/2021) 70 g 0   norethindrone (AYGESTIN) 5 MG tablet TAKE 1 TABLET BY MOUTH 2 TIMES DAILY FOR 5 DAYS 10 tablet 6   vortioxetine HBr (TRINTELLIX) 10 MG TABS tablet Take 1 tablet (10 mg total) by mouth daily. 90 tablet 0   Current Facility-Administered Medications on File Prior to Visit  Medication Dose Route Frequency Provider Last Rate Last Admin   0.9 %  sodium chloride infusion  500 mL Intravenous Continuous Pyrtle, Carie Caddy, MD        Review of Systems  Constitutional:  Negative for chills and fever.  Respiratory:  Negative for cough.   Cardiovascular:  Negative for chest pain and palpitations.  Gastrointestinal:  Negative for abdominal pain, nausea and vomiting.  Genitourinary:  Negative for dysuria and hematuria.  Musculoskeletal:  Positive for back pain.  Neurological:  Negative for numbness.      Objective:    BP 122/78   Pulse 76   Temp (!) 97.4 F (36.3 C) (Oral)   Ht 5\' 7"  (1.702 m)   Wt 169 lb (76.7 kg)   SpO2 96%   BMI 26.47 kg/m   BP Readings from Last 3 Encounters:  04/10/23 122/78  05/12/22 124/86  04/22/21 106/64   Wt Readings from Last 3 Encounters:  04/10/23 169 lb (76.7 kg)  05/12/22 205 lb (93 kg)  04/22/21 254 lb 6.4 oz (115.4 kg)    Physical Exam Vitals reviewed.  Constitutional:      Appearance: She is well-developed.  Eyes:     Conjunctiva/sclera: Conjunctivae normal.  Cardiovascular:     Rate and Rhythm: Normal rate and regular rhythm.     Pulses: Normal pulses.     Heart sounds: Normal heart  sounds.  Pulmonary:     Effort: Pulmonary effort is normal.     Breath sounds: Normal breath sounds. No wheezing, rhonchi or rales.  Musculoskeletal:     Thoracic back: Tenderness present. No signs of trauma, lacerations, spasms or bony tenderness. Normal range of motion.       Back:  Skin:    General: Skin is warm and dry.  Neurological:     Mental Status: She is alert.  Psychiatric:        Speech: Speech normal.        Behavior: Behavior normal.  Thought Content: Thought content normal.

## 2023-04-10 NOTE — Patient Instructions (Addendum)
Please have her x-rays done at [facility].  You do not need an appointment for this.  I have sent in Zepbound for you to try.  Please let me know how your back pain is doing, most certainly if new symptoms develop or symptoms do not improve.  Referral to physical therapy  Let us know if you dont hear back within a week in regards to an appointment being scheduled.   So that you are aware, if you are [facility] MyChart user , please pay attention to your MyChart messages as you may receive a MyChart message with a phone number to call and schedule this test/appointment own your own from our referral coordinator. This is a new process so I do not want you to miss this message.  If you are not a MyChart user, you will receive a phone call.

## 2023-04-10 NOTE — Assessment & Plan Note (Signed)
Congratulated patient on significant weight loss.  She has nearly obtained a normal BMI.  At this point the goal is to maintain weight.  Insurance does not cover [employer].  Zepbound likely will be cheaper for her.  Start moderate dose Zepbound 5 mg.  Counseled on mechanism of action, side effects.

## 2023-04-11 LAB — URINALYSIS, ROUTINE W REFLEX MICROSCOPIC
Bilirubin Urine: NEGATIVE
Hgb urine dipstick: NEGATIVE
Ketones, ur: NEGATIVE
Leukocytes,Ua: NEGATIVE
Nitrite: NEGATIVE
Specific Gravity, Urine: 1.025 (ref 1.000–1.030)
Total Protein, Urine: NEGATIVE
Urine Glucose: NEGATIVE
Urobilinogen, UA: 0.2 (ref 0.0–1.0)
pH: 6 (ref 5.0–8.0)

## 2023-04-13 ENCOUNTER — Ambulatory Visit
Admission: RE | Admit: 2023-04-13 | Discharge: 2023-04-13 | Disposition: A | Discharge status: Home / Self Care | Payer: Commercial Managed Care - PPO | Source: Ambulatory Visit | Attending: Family | Admitting: Family

## 2023-04-13 DIAGNOSIS — M545 Low back pain, unspecified: Secondary | ICD-10-CM | POA: Diagnosis not present

## 2023-04-13 DIAGNOSIS — M549 Dorsalgia, unspecified: Secondary | ICD-10-CM | POA: Insufficient documentation

## 2023-04-13 DIAGNOSIS — M546 Pain in thoracic spine: Secondary | ICD-10-CM | POA: Diagnosis not present

## 2023-04-17 DIAGNOSIS — F3341 Major depressive disorder, recurrent, in partial remission: Secondary | ICD-10-CM | POA: Diagnosis not present

## 2023-04-17 DIAGNOSIS — F900 Attention-deficit hyperactivity disorder, predominantly inattentive type: Secondary | ICD-10-CM | POA: Diagnosis not present

## 2023-04-18 ENCOUNTER — Other Ambulatory Visit (HOSPITAL_COMMUNITY): Payer: Self-pay

## 2023-04-18 MED ORDER — TRINTELLIX 5 MG PO TABS
5.0000 mg | ORAL_TABLET | Freq: Every day | ORAL | 2 refills | Status: DC
  Filled 2023-04-18: qty 90, 90d supply, fill #0
  Filled 2023-06-26 – 2023-09-01 (×3): qty 90, 90d supply, fill #1
  Filled 2023-09-28: qty 90, 90d supply, fill #2

## 2023-04-21 ENCOUNTER — Other Ambulatory Visit (HOSPITAL_COMMUNITY): Payer: Self-pay

## 2023-05-15 ENCOUNTER — Encounter: Payer: Self-pay | Admitting: Family

## 2023-05-15 ENCOUNTER — Ambulatory Visit: Payer: Commercial Managed Care - PPO | Admitting: Family

## 2023-05-15 VITALS — BP 124/82 | HR 78 | Temp 97.8°F | Ht 67.0 in | Wt 175.0 lb

## 2023-05-15 DIAGNOSIS — D6861 Antiphospholipid syndrome: Secondary | ICD-10-CM | POA: Diagnosis not present

## 2023-05-15 DIAGNOSIS — H11133 Conjunctival pigmentations, bilateral: Secondary | ICD-10-CM

## 2023-05-15 DIAGNOSIS — H11139 Conjunctival pigmentations, unspecified eye: Secondary | ICD-10-CM | POA: Insufficient documentation

## 2023-05-15 DIAGNOSIS — N898 Other specified noninflammatory disorders of vagina: Secondary | ICD-10-CM | POA: Insufficient documentation

## 2023-05-15 DIAGNOSIS — H524 Presbyopia: Secondary | ICD-10-CM | POA: Diagnosis not present

## 2023-05-15 LAB — COMPREHENSIVE METABOLIC PANEL
ALT: 9 U/L (ref 0–35)
AST: 17 U/L (ref 0–37)
Albumin: 4.1 g/dL (ref 3.5–5.2)
Alkaline Phosphatase: 54 U/L (ref 39–117)
BUN: 19 mg/dL (ref 6–23)
CO2: 30 mEq/L (ref 19–32)
Calcium: 9 mg/dL (ref 8.4–10.5)
Chloride: 104 mEq/L (ref 96–112)
Creatinine, Ser: 0.7 mg/dL (ref 0.40–1.20)
GFR: 100.22 mL/min (ref >60.00)
Glucose, Bld: 76 mg/dL (ref 70–99)
Potassium: 4 mEq/L (ref 3.5–5.1)
Sodium: 140 mEq/L (ref 135–145)
Total Bilirubin: 1.2 mg/dL (ref 0.2–1.2)
Total Protein: 7.2 g/dL (ref 6.0–8.3)

## 2023-05-15 LAB — CBC WITH DIFFERENTIAL/PLATELET
Basophils Absolute: 0 10*3/uL (ref 0.0–0.1)
Basophils Relative: 0.9 % (ref 0.0–3.0)
Eosinophils Absolute: 0 10*3/uL (ref 0.0–0.7)
Eosinophils Relative: 0.8 % (ref 0.0–5.0)
HCT: 39.3 % (ref 36.0–46.0)
Hemoglobin: 12.9 g/dL (ref 12.0–15.0)
Lymphocytes Relative: 28.8 % (ref 12.0–46.0)
Lymphs Abs: 1.5 10*3/uL (ref 0.7–4.0)
MCHC: 32.9 g/dL (ref 30.0–36.0)
MCV: 93.3 fl (ref 78.0–100.0)
Monocytes Absolute: 0.4 10*3/uL (ref 0.1–1.0)
Monocytes Relative: 8.3 % (ref 3.0–12.0)
Neutro Abs: 3.2 10*3/uL (ref 1.4–7.7)
Neutrophils Relative %: 61.2 % (ref 43.0–77.0)
Platelets: 261 10*3/uL (ref 150.0–400.0)
RBC: 4.21 Mil/uL (ref 3.87–5.11)
RDW: 13.8 % (ref 11.5–15.5)
WBC: 5.3 10*3/uL (ref 4.0–10.5)

## 2023-05-15 LAB — PROTIME-INR
INR: 1.1 ratio — ABNORMAL HIGH (ref 0.8–1.0)
Prothrombin Time: 11.9 s (ref 9.6–13.1)

## 2023-05-15 NOTE — Progress Notes (Unsigned)
Assessment & Plan:  Vaginal discharge Assessment & Plan: Pending urine, vaginal studies.   Orders: -     CBC with Differential/Platelet -     Urinalysis, Routine w reflex microscopic -     Urine Culture -     Cervicovaginal ancillary only  Antiphospholipid antibody syndrome (HCC) Assessment & Plan: Question history of antiphospholipid antibody syndrome.  Advise follow-up with hematology, Dr. Smith Robert.   Orders: -     Ambulatory referral to Hematology / Oncology -     CBC with Differential/Platelet  Conjunctival pigmentations of both eyes Assessment & Plan: Reassuring reassuring exam without evidence of jaundice on eye exam.  No clinical symptoms to suggest hepatitis.  she has follow-up with her ophthalmologist today.  Will follow. Pending labs.   Orders: -     Comprehensive metabolic panel -     Bilirubin, fractionated(tot/dir/indir) -     Protime-INR -     CBC with Differential/Platelet     Return precautions given.   Risks, benefits, and alternatives of the medications and treatment plan prescribed today were discussed, and patient expressed understanding.   Education regarding symptom management and diagnosis given to patient on AVS either electronically or printed.  No follow-ups on file.  Rennie Plowman, FNP  Subjective:    Patient ID: Molly Sanchez, female    DOB: 1970/12/13, 52 y.o.   MRN: [id number]  CC: Molly Sanchez is a 52 y.o. female who presents today for an acute visit.    HPI: Complains of vaginal itching for a couple of weeks.     She has noticed any yellow discoloration of her eyes.  Her son noticed as well.    She is seeing her eye doctor today  No eye pain, vision changes  Started Zepbound 5mg  at last visit.  Denies alcohol use , recent trazel,or exposure to  harardous exposure  Denies liver disease.   No fever, N, vomiting, anorexia, malaise, right upper quadrant pain, bleeding, rash, leg swelling   She has h/o  antiphospholipid antibody syndrome in pregnancy  No H/o DVT   Last seen Quentin Mulling 4 11/29/2018 antiphospholipid antibody syndrome.  Question of connective tissue disorder. Plan: 1.  Discuss work-up.  No current evidence of a lupus anticoagulant/anti-phospholipid antibody syndrome.  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 2.  Follow-up with Dr. Gavin Potters as scheduled. 3.  RTC in 3 months for MD assessment and labs (CBC with diff, CMP, anti-cardiolipin antibodies).    She previously followed with Dr. Gavin Potters   Allergies: Patient has no known allergies. Current Outpatient Medications on File Prior to Visit  Medication Sig Dispense Refill   Cholecalciferol (VITAMIN D3) 1.25 MG (50000 UT) CAPS Take 1 capsule by mouth once a week 12 capsule 4   Cholecalciferol (VITAMIN D3) 1.25 MG (50000 UT) CAPS Take 1 capsule by mouth once a week. 12 capsule 4   COVID-19 At Home Antigen Test (CARESTART COVID-19 HOME TEST) KIT Use as directed within package instructions. 4 each 0   ibuprofen (ADVIL) 600 MG tablet Take 1 tablet (600 mg total) by mouth every 4 (four) to 6 (six) hours. 10 tablet 0   lisdexamfetamine (VYVANSE) 70 MG capsule Take 1 capsule (70 mg total) by mouth every morning. (fill 05/07/22) 90 capsule 0   vortioxetine HBr (TRINTELLIX) 10 MG TABS tablet Take 1 tablet (10 mg total) by mouth daily. 90 tablet 0  vortioxetine HBr (TRINTELLIX) 5 MG TABS tablet Take 1 tablet (5 mg total) by mouth daily. 90 tablet 2   Current Facility-Administered Medications on File Prior to Visit  Medication Dose Route Frequency Provider Last Rate Last Admin   0.9 %  sodium chloride infusion  500 mL Intravenous Continuous Pyrtle, Carie Caddy, MD        Review of Systems  Constitutional:  Negative for chills and fever.  Eyes:  Negative for visual disturbance.  Respiratory:  Negative for cough.   Cardiovascular:   Negative for chest pain and palpitations.  Gastrointestinal:  Negative for abdominal pain, nausea and vomiting.  Genitourinary:  Positive for vaginal discharge. Negative for difficulty urinating and pelvic pain.      Objective:    BP 124/82   Pulse 78   Temp 97.8 F (36.6 C) (Oral)   Ht 5\' 7"  (1.702 m)   Wt 175 lb (79.4 kg)   SpO2 97%   BMI 27.41 kg/m   BP Readings from Last 3 Encounters:  05/16/23 124/78  05/15/23 124/82  04/10/23 122/78   Wt Readings from Last 3 Encounters:  05/16/23 174 lb (78.9 kg)  05/15/23 175 lb (79.4 kg)  04/10/23 169 lb (76.7 kg)    Physical Exam Vitals reviewed.  Constitutional:      Appearance: She is well-developed.  HENT:     Head: Normocephalic and atraumatic.     Right Ear: Hearing, tympanic membrane, ear canal and external ear normal. No decreased hearing noted. No drainage, swelling or tenderness. No middle ear effusion. No foreign body. Tympanic membrane is not erythematous or bulging.     Left Ear: Hearing, tympanic membrane, ear canal and external ear normal. No decreased hearing noted. No drainage, swelling or tenderness.  No middle ear effusion. No foreign body. Tympanic membrane is not erythematous or bulging.     Nose: No rhinorrhea.     Right Sinus: No maxillary sinus tenderness or frontal sinus tenderness.     Left Sinus: No maxillary sinus tenderness or frontal sinus tenderness.     Mouth/Throat:     Pharynx: Uvula midline. No oropharyngeal exudate or posterior oropharyngeal erythema.     Tonsils: No tonsillar abscesses.  Eyes:     General: Lids are everted, no foreign bodies appreciated. No scleral icterus.       Right eye: No discharge or hordeolum.        Left eye: No discharge or hordeolum.     Conjunctiva/sclera:     Right eye: Right conjunctiva is not injected. No hemorrhage.    Left eye: Left conjunctiva is not injected. No hemorrhage.    Pupils: Pupils are equal, round, and reactive to light.     Comments: No  external eye lesions. Surrounding skin intact.  No observed jaundice ofconjunctiva. No injection of the conjunctiva. No white spots, opacity, or foreign body appreciated. No collection of blood or pus in the anterior chamber. No ciliary flush surrounding iris.   No photophobia or eye pain appreciated during exam.   Cardiovascular:     Rate and Rhythm: Regular rhythm.     Pulses: Normal pulses.     Heart sounds: Normal heart sounds.  Pulmonary:     Effort: Pulmonary effort is normal.     Breath sounds: Normal breath sounds. No wheezing, rhonchi or rales.  Lymphadenopathy:     Head:     Right side of head: No submental, submandibular, tonsillar, preauricular, posterior auricular or occipital adenopathy.  Left side of head: No submental, submandibular, tonsillar, preauricular, posterior auricular or occipital adenopathy.     Cervical: No cervical adenopathy.  Skin:    General: Skin is warm and dry.  Neurological:     Mental Status: She is alert.  Psychiatric:        Speech: Speech normal.        Behavior: Behavior normal.        Thought Content: Thought content normal.

## 2023-05-16 ENCOUNTER — Inpatient Hospital Stay: Payer: Commercial Managed Care - PPO

## 2023-05-16 ENCOUNTER — Encounter: Payer: Self-pay | Admitting: Oncology

## 2023-05-16 ENCOUNTER — Inpatient Hospital Stay: Payer: Commercial Managed Care - PPO | Attending: Oncology | Admitting: Oncology

## 2023-05-16 ENCOUNTER — Other Ambulatory Visit (HOSPITAL_COMMUNITY)
Admission: RE | Admit: 2023-05-16 | Discharge: 2023-05-16 | Disposition: A | Discharge status: Home / Self Care | Payer: Commercial Managed Care - PPO | Source: Ambulatory Visit | Attending: Family | Admitting: Family

## 2023-05-16 VITALS — BP 124/78 | HR 69 | Temp 95.4°F | Resp 18 | Ht 67.0 in | Wt 174.0 lb

## 2023-05-16 DIAGNOSIS — Z79899 Other long term (current) drug therapy: Secondary | ICD-10-CM | POA: Insufficient documentation

## 2023-05-16 DIAGNOSIS — Z862 Personal history of diseases of the blood and blood-forming organs and certain disorders involving the immune mechanism: Secondary | ICD-10-CM | POA: Insufficient documentation

## 2023-05-16 DIAGNOSIS — N898 Other specified noninflammatory disorders of vagina: Secondary | ICD-10-CM | POA: Insufficient documentation

## 2023-05-16 LAB — URINE CULTURE
MICRO NUMBER:: 15038899
SPECIMEN QUALITY:: ADEQUATE

## 2023-05-16 LAB — BILIRUBIN, FRACTIONATED(TOT/DIR/INDIR)
Bilirubin, Direct: 0.2 mg/dL (ref 0.0–0.2)
Indirect Bilirubin: 0.9 mg/dL (calc) (ref 0.2–1.2)
Total Bilirubin: 1.1 mg/dL (ref 0.2–1.2)

## 2023-05-16 NOTE — Progress Notes (Signed)
Hematology/Oncology Consult note [facility] Telephone:([phone] [phone]  Patient Care Team: Allegra Grana, FNP as PCP - General (Family Medicine) Kandyce Rud., MD (Rheumatology)   Name of the patient: Molly Sanchez  [id number]  1971-03-15    Reason for referral-history of obstetric APS   Referring physician-Margaret Jason Coop, FNP  Date of visit: 05/16/23   History of presenting illness- Patient is a 52 year old African-American female who was last seen by Dr. Merlene Pulling from our practice back in 2019.  Patient has had stillbirth as well as pregnancy losses in her first trimester x 3.  It appears that patient was diagnosed with obstetric antiphospholipid antibody syndrome and subsequently had 2 normal pregnancies after taking aspirin and Lovenox.  Patient has not had any episodes of thrombosis TIAs or cardiovascular events outside of pregnancy.  Patient has had labs checked for antiphospholipid antibody syndrome in the past.  Beta-2 glycoprotein and lupus anticoagulant was negative.  She has had mildly elevated anticardiolipin IgM antibody titers but they were less than 40.  Family history of stroke in her mother.  She is not presently on any anticoagulation.  Patient wants to consider hormone replacement therapy to help with her menopausal symptoms.  ECOG PS- 0  Pain scale- 0   Review of systems- Review of Systems  Constitutional:  Negative for chills, fever, malaise/fatigue and weight loss.  HENT:  Negative for congestion, ear discharge and nosebleeds.   Eyes:  Negative for blurred vision.  Respiratory:  Negative for cough, hemoptysis, sputum production, shortness of breath and wheezing.   Cardiovascular:  Negative for chest pain, palpitations, orthopnea and claudication.  Gastrointestinal:  Negative for abdominal pain, blood in stool, constipation, diarrhea, heartburn, melena, nausea and vomiting.  Genitourinary:  Negative for  dysuria, flank pain, frequency, hematuria and urgency.  Musculoskeletal:  Negative for back pain, joint pain and myalgias.  Skin:  Negative for rash.  Neurological:  Negative for dizziness, tingling, focal weakness, seizures, weakness and headaches.  Endo/Heme/Allergies:  Does not bruise/bleed easily.  Psychiatric/Behavioral:  Negative for depression and suicidal ideas. The patient does not have insomnia.     No Known Allergies  Patient Active Problem List   Diagnosis Date Noted   Vaginal discharge 05/15/2023   Conjunctival pigmentations 05/15/2023   Pelvic pain 05/12/2022   Constipation 05/12/2022   Mid back pain    Goals of care, counseling/discussion 03/22/2018   Insulin resistance 02/13/2017   Class 2 obesity without serious comorbidity with body mass index (BMI) of 37.0 to 37.9 in adult 02/13/2017   Rheumatoid factor positive 07/07/2016   Routine general medical examination at a health care facility 02/17/2016   Annual physical exam 01/22/2016   GERD (gastroesophageal reflux disease) 01/22/2016   Weight gain 01/22/2016   ADHD (attention deficit hyperactivity disorder) 01/22/2016   Depression 01/22/2016   Antiphospholipid antibody syndrome (HCC) 11/15/2012   Dysplasia of cervix, low grade (CIN 1)    Rosacea    Fibromyalgia    ANA positive    Raynaud's disease      Past Medical History:  Diagnosis Date   ADD (attention deficit disorder)    ANA positive    Antiphospholipid antibody syndrome (HCC) 11/15/2012   Antiphospholipid antibody syndrome complicating pregnancy (HCC)    Back pain    Breast fibroadenoma    history of   Clotting disorder (HCC)    with pregancy   Depression    Dysplasia of cervix, low grade (CIN 1) [id number]   Fatigue  Fibromyalgia    GERD (gastroesophageal reflux disease)    H/O varicella    Hx of blood clots    During pregnancy   Joint pain    Raynaud's disease    Rosacea    Vitamin D deficiency      Past Surgical History:   Procedure Laterality Date   CESAREAN SECTION     GYNECOLOGIC CRYOSURGERY     of cervix   right breast fibroadenoma removed      Social History   Socioeconomic History   Marital status: Married    Spouse name: Not on file   Number of children: Not on file   Years of education: Not on file   Highest education level: Not on file  Occupational History   Occupation: Personnel officer: [employer]  Tobacco Use   Smoking status: Never   Smokeless tobacco: Never  Vaping Use   Vaping Use: Never used  Substance and Sexual Activity   Alcohol use: No    Alcohol/week: 1.0 standard drink of alcohol    Types: 1 Glasses of wine per week    Comment: none    Drug use: No   Sexual activity: Yes    Partners: Male    Birth control/protection: I.U.D.    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[employer])   Social Determinants of Health   Financial Resource Strain: Not on file  Food Insecurity: No Food Insecurity (05/16/2023)   Hunger Vital Sign    Worried About Running Out of Food in the Last Year: Never true    Ran Out of Food in the Last Year: Never true  Transportation Needs: No Transportation Needs (05/16/2023)   PRAPARE - Administrator, Civil Service (Medical): No    Lack of Transportation (Non-Medical): No  Physical Activity: Not on file  Stress: Not on file  Social Connections: Not on file  Intimate Partner Violence: Not At Risk (05/16/2023)   Humiliation, Afraid, Rape, and Kick questionnaire    Fear of Current or Ex-Partner: No    Emotionally Abused: No    Physically Abused: No    Sexually Abused: No     Family History  Problem Relation Age of Onset   Hypertension Mother    Stroke Mother    Heart attack Mother    Diabetes Mother    Hyperlipidemia Mother     Heart disease Mother    Depression Mother    Anxiety disorder Mother    Schizophrenia Mother    Obesity Mother    Mental illness Brother        schizophrenia   Colon cancer Neg Hx    Esophageal cancer Neg Hx    Rectal cancer Neg Hx    Stomach cancer Neg Hx    Pancreatic cancer Neg Hx    Thyroid cancer Neg Hx      Current Outpatient Medications:    Cholecalciferol (VITAMIN D3) 1.25 MG (50000 UT) CAPS, Take 1 capsule by mouth once a week, Disp: 12 capsule, Rfl: 4   Cholecalciferol (VITAMIN D3) 1.25 MG (50000 UT) CAPS, Take 1 capsule by mouth once a week., Disp: 12 capsule, Rfl: 4   COVID-19 At Home Antigen Test (CARESTART COVID-19  HOME TEST) KIT, Use as directed within package instructions., Disp: 4 each, Rfl: 0   ibuprofen (ADVIL) 600 MG tablet, Take 1 tablet (600 mg total) by mouth every 4 (four) to 6 (six) hours., Disp: 10 tablet, Rfl: 0   lisdexamfetamine (VYVANSE) 70 MG capsule, Take 1 capsule (70 mg total) by mouth every morning. (fill 05/07/22), Disp: 90 capsule, Rfl: 0   vortioxetine HBr (TRINTELLIX) 10 MG TABS tablet, Take 1 tablet (10 mg total) by mouth daily., Disp: 90 tablet, Rfl: 0   vortioxetine HBr (TRINTELLIX) 5 MG TABS tablet, Take 1 tablet (5 mg total) by mouth daily., Disp: 90 tablet, Rfl: 2  Current Facility-Administered Medications:    0.9 %  sodium chloride infusion, 500 mL, Intravenous, Continuous, Pyrtle, Carie Caddy, MD   Physical exam:  Vitals:   05/16/23 1051  BP: 124/78  Pulse: 69  Resp: 18  Temp: (!) 95.4 F (35.2 C)  TempSrc: Tympanic  SpO2: 92%  Weight: 174 lb (78.9 kg)  Height: 5\' 7"  (1.702 m)   Physical Exam Cardiovascular:     Rate and Rhythm: Normal rate and regular rhythm.     Heart sounds: Normal heart sounds.  Pulmonary:     Effort: Pulmonary effort is normal.  Skin:    General: Skin is warm and dry.  Neurological:     Mental Status: She is alert and oriented to person, place, and time.           Latest Ref Rng & Units 05/15/2023    12:23 PM  CMP  Glucose 70 - 99 mg/dL 76   BUN 6 - 23 mg/dL 19   Creatinine 1.61 - 1.20 mg/dL 0.96   Sodium 045 - 409 mEq/L 140   Potassium 3.5 - 5.1 mEq/L 4.0   Chloride 96 - 112 mEq/L 104   CO2 19 - 32 mEq/L 30   Calcium 8.4 - 10.5 mg/dL 9.0   Total Protein 6.0 - 8.3 g/dL 7.2   Total Bilirubin 0.2 - 1.2 mg/dL 0.2 - 1.2 mg/dL 1.2    1.1   Alkaline Phos 39 - 117 U/L 54   AST 0 - 37 U/L 17   ALT 0 - 35 U/L 9       Latest Ref Rng & Units 05/15/2023   12:23 PM  CBC  WBC 4.0 - 10.5 K/uL 5.3   Hemoglobin 12.0 - 15.0 g/dL 81.1   Hematocrit 91.4 - 46.0 % 39.3   Platelets 150.0 - 400.0 K/uL 261.0     Assessment and plan- Patient is a 52 y.o. female with history of obstetric antiphospholipid antibody syndrome referred for further management  It appears that patient was diagnosed with obstetric antiphospholipid antibody syndrome when she had recurrent pregnancy losses about 13 to 15 years ago.  I do not have any results of the workup.  She subsequently had 2 normal pregnancies after taking aspirin and Lovenox during pregnancy.  Outside of pregnancy patient has not had any thrombotic events.  Patient therefore does not have antiphospholipid antibody syndrome presently and what she had was APS strictly related to pregnancy.  She has had antiphospholipid antibody syndrome labs checked in the past which showed mildly elevated anticardiolipin titers that were less than 40.  She not only does not meet criteria for antiphospholipid antibody syndrome based on her labs but also the fact that she has not had any thrombotic events outside of pregnancy.  We therefore do not need to continue checking these labs for surveillance.  Patient  states that she has had positive ANA and rheumatoid factor in the past and patients who have autoimmune antibodies can have mildly elevated titers of antiphospholipid antibody syndrome as well.  Patient does not require any anticoagulation in the absence of thrombosis.  She does  not require hematology follow-up at this time.  Patient can have discussions about hormone replacement therapy with her PCP or GYN as this should not interfere with her diagnosis of obstetric APS.   Thank you for this kind referral and the opportunity to participate in the care of this patient   Visit Diagnosis 1. History of antiphospholipid antibody syndrome     Dr. Owens Shark, MD, MPH [facility] at [facility] [phone] 05/16/2023

## 2023-05-17 ENCOUNTER — Encounter: Payer: Self-pay | Admitting: Family

## 2023-05-17 LAB — CERVICOVAGINAL ANCILLARY ONLY
Bacterial Vaginitis (gardnerella): NEGATIVE
Candida Glabrata: NEGATIVE
Candida Vaginitis: NEGATIVE
Comment: NEGATIVE
Comment: NEGATIVE
Comment: NEGATIVE

## 2023-05-18 NOTE — Telephone Encounter (Signed)
You are certainly welcome! Hope you do as well!

## 2023-05-21 ENCOUNTER — Other Ambulatory Visit (HOSPITAL_COMMUNITY): Payer: Self-pay

## 2023-05-21 ENCOUNTER — Other Ambulatory Visit: Payer: Self-pay | Admitting: Family

## 2023-05-21 DIAGNOSIS — E88819 Insulin resistance, unspecified: Secondary | ICD-10-CM

## 2023-05-21 MED ORDER — METFORMIN HCL ER 500 MG PO TB24
500.0000 mg | ORAL_TABLET | Freq: Every evening | ORAL | 3 refills | Status: DC
Start: 2023-05-21 — End: 2023-07-24
  Filled 2023-05-21: qty 90, 90d supply, fill #0
  Filled 2023-07-16 – 2023-07-20 (×2): qty 90, 90d supply, fill #1

## 2023-05-22 NOTE — Assessment & Plan Note (Signed)
Reassuring reassuring exam without evidence of jaundice on eye exam.  No clinical symptoms to suggest hepatitis.  she has follow-up with her ophthalmologist today.  Will follow. Pending labs.

## 2023-05-22 NOTE — Patient Instructions (Signed)
Please let me know how your eye appointment goes.  I do not see any particular yellowing of your eyes.  We will look extensively at lab work.

## 2023-05-22 NOTE — Assessment & Plan Note (Signed)
Pending urine, vaginal studies.

## 2023-05-22 NOTE — Assessment & Plan Note (Signed)
Question history of antiphospholipid antibody syndrome.  Advise follow-up with hematology, Dr. Smith Robert.

## 2023-05-25 ENCOUNTER — Other Ambulatory Visit: Payer: Self-pay

## 2023-05-25 DIAGNOSIS — N898 Other specified noninflammatory disorders of vagina: Secondary | ICD-10-CM

## 2023-05-27 ENCOUNTER — Telehealth: Payer: Commercial Managed Care - PPO

## 2023-05-30 ENCOUNTER — Ambulatory Visit: Payer: Commercial Managed Care - PPO | Attending: Family | Admitting: Physical Therapy

## 2023-05-30 ENCOUNTER — Ambulatory Visit: Payer: Commercial Managed Care - PPO | Admitting: Family

## 2023-05-30 ENCOUNTER — Other Ambulatory Visit (HOSPITAL_COMMUNITY): Payer: Self-pay

## 2023-05-30 ENCOUNTER — Encounter: Payer: Self-pay | Admitting: Family

## 2023-05-30 ENCOUNTER — Other Ambulatory Visit: Payer: Self-pay

## 2023-05-30 VITALS — BP 130/76 | HR 75 | Temp 97.5°F | Ht 67.0 in | Wt 179.0 lb

## 2023-05-30 DIAGNOSIS — E669 Obesity, unspecified: Secondary | ICD-10-CM | POA: Diagnosis not present

## 2023-05-30 DIAGNOSIS — Z6837 Body mass index (BMI) 37.0-37.9, adult: Secondary | ICD-10-CM

## 2023-05-30 DIAGNOSIS — M549 Dorsalgia, unspecified: Secondary | ICD-10-CM | POA: Insufficient documentation

## 2023-05-30 DIAGNOSIS — R232 Flushing: Secondary | ICD-10-CM

## 2023-05-30 DIAGNOSIS — B354 Tinea corporis: Secondary | ICD-10-CM | POA: Diagnosis not present

## 2023-05-30 DIAGNOSIS — M5459 Other low back pain: Secondary | ICD-10-CM | POA: Insufficient documentation

## 2023-05-30 MED ORDER — CLOTRIMAZOLE 1 % EX CREA
1.0000 | TOPICAL_CREAM | Freq: Two times a day (BID) | CUTANEOUS | 2 refills | Status: AC
Start: 2023-05-30 — End: ?
  Filled 2023-05-30: qty 30, 15d supply, fill #0
  Filled 2023-06-26: qty 30, 15d supply, fill #1
  Filled 2024-03-31: qty 30, 15d supply, fill #2

## 2023-05-30 MED ORDER — ZEPBOUND 7.5 MG/0.5ML ~~LOC~~ SOAJ
7.5000 mg | SUBCUTANEOUS | 3 refills | Status: DC
Start: 2023-05-30 — End: 2024-07-25
  Filled 2023-05-30 – 2024-01-05 (×2): qty 2, 28d supply, fill #0
  Filled 2024-02-23: qty 2, 28d supply, fill #1
  Filled 2024-03-31: qty 2, 28d supply, fill #2
  Filled 2024-05-12: qty 2, 28d supply, fill #3

## 2023-05-30 NOTE — Assessment & Plan Note (Signed)
Not particularly bothersome at this time.  We discussed gabapentin, Paxil, hormone replacement therapy.  Briefly discussed risk and benefit of hormone replacement therapy.  Patient like to trial natural measures at this time including cooling clothing or black cohosh.  She will let me know how she is doing

## 2023-05-30 NOTE — Assessment & Plan Note (Signed)
Patient has started to gain weight.  Increase Zepbound to 7.5 mg

## 2023-05-30 NOTE — Therapy (Unsigned)
OUTPATIENT PHYSICAL THERAPY THORACOLUMBAR EVALUATION   Patient Name: Molly Sanchez MRN: [id number] DOB:[birth year], 52 y.o., female Today's Date: 05/30/2023  END OF SESSION:  PT End of Session - 05/30/23 1657     Visit Number 1    Number of Visits 20    Date for PT Re-Evaluation 08/08/23    Authorization Type [employer] [id number]    Authorization - Visit Number 1    Authorization - Number of Visits 20    Progress Note Due on Visit 10    PT Start Time 1645    PT Stop Time 1730    PT Time Calculation (min) 45 min    Activity Tolerance Patient tolerated treatment well    Behavior During Therapy [facility] for tasks assessed/performed             Past Medical History:  Diagnosis Date   ADD (attention deficit disorder)    ANA positive    Antiphospholipid antibody syndrome (HCC) 11/15/2012   Antiphospholipid antibody syndrome complicating pregnancy (HCC)    Back pain    Breast fibroadenoma    history of   Clotting disorder (HCC)    with pregancy   Depression    Dysplasia of cervix, low grade (CIN 1) [id number]   Fatigue    Fibromyalgia    GERD (gastroesophageal reflux disease)    H/O varicella    Hx of blood clots    During pregnancy   Joint pain    Raynaud's disease    Rosacea    Vitamin D deficiency    Past Surgical History:  Procedure Laterality Date   CESAREAN SECTION     GYNECOLOGIC CRYOSURGERY     of cervix   right breast fibroadenoma removed     Patient Active Problem List   Diagnosis Date Noted   Tinea corporis 05/30/2023   Hot flashes 05/30/2023   Vaginal discharge 05/15/2023   Conjunctival pigmentations 05/15/2023   Pelvic pain 05/12/2022   Constipation 05/12/2022   Mid back pain    Goals of care, counseling/discussion 03/22/2018   Insulin resistance 02/13/2017   Class 2 obesity without serious comorbidity with body mass index (BMI) of 37.0 to 37.9 in adult 02/13/2017   Rheumatoid factor positive 07/07/2016   Routine general medical examination at a  health care facility 02/17/2016   Annual physical exam 01/22/2016   GERD (gastroesophageal reflux disease) 01/22/2016   Weight gain 01/22/2016   ADHD (attention deficit hyperactivity disorder) 01/22/2016   Depression 01/22/2016   Antiphospholipid antibody syndrome (HCC) 11/15/2012   Dysplasia of cervix, low grade (CIN 1)    Rosacea    Fibromyalgia    ANA positive    Raynaud's disease     PCP: Rennie Plowman FNP   REFERRING PROVIDER: Rennie Plowman FNP   REFERRING DIAG: M54.9 (ICD-10-CM) - Mid back pain  Rationale for Evaluation and Treatment: Rehabilitation  THERAPY DIAG:  No diagnosis found.  ONSET DATE: 12/29/22  SUBJECTIVE:  SUBJECTIVE STATEMENT: See pertinent history   PERTINENT HISTORY:  Pt reports increased mid and lower back pain especially with increased activity like playing basketball or doing choirs. The mid back pain started 6 months ago and he last major flare was 6 weeks ago after playing basketball with her children. She mostly works at [employer] and she experiences more pain at the end of a work day.   PAIN:  Are you having pain? Yes: NPRS scale: 1/10 Pain location: T6  Pain description: Achy  Aggravating factors: physical activity  Relieving factors: Tylenol   PRECAUTIONS: None  WEIGHT BEARING RESTRICTIONS: No  FALLS:  Has patient fallen in last 6 months? No  LIVING ENVIRONMENT: Lives with: lives with their family Lives in: House/apartment Stairs: Yes: Internal: 13 steps; on right going up Has following equipment at home: None  OCCUPATION: Research officer, political party for medical office   PLOF: Independent  PATIENT GOALS: Pt wants to ease her pain and increase her flexibility and start strength training without increasing her pain level.   NEXT MD VISIT:  Nothing scheduled   OBJECTIVE:   VITALS: BP 116/72 HR 88 SpO2 100   DIAGNOSTIC FINDINGS:  CLINICAL DATA:  Mid back pain.   EXAM: THORACIC SPINE - 3 VIEWS   COMPARISON:  None Available.   FINDINGS: Mild multilevel degenerative disc disease with tiny anterior osteophytes. No loss of disc height. No fracture or malalignment.   IMPRESSION: Mild multilevel degenerative disc disease with tiny anterior osteophytes. No loss of disc height.     Electronically Signed   By: Gerome Sam III M.D.   On: 04/16/2023 08:31 /////////////////////////////////////////////////////////////////////////////////  CLINICAL DATA:  Pain   EXAM: LUMBAR SPINE - COMPLETE 4+ VIEW   COMPARISON:  None Available.   FINDINGS: Trace retrolisthesis of L3 versus L4 and L5 versus S1. No other malalignment. No fractures. Mild degenerative disc disease most prominent at L1-2 and L5-S1. Partial loss of disc height at L5-S1. Mild lumbar facet degenerative changes suspected. No other abnormalities.   IMPRESSION: Degenerative changes as above.     Electronically Signed   By: Gerome Sam III M.D.   On: 04/16/2023 08:33  PATIENT SURVEYS:  FOTO 48/100  SCREENING FOR RED FLAGS: Bowel or bladder incontinence: No Spinal tumors: No Cauda equina syndrome: No Compression fracture: No Abdominal aneurysm: No  COGNITION: Overall cognitive status: Within functional limits for tasks assessed     SENSATION: WFL  MUSCLE LENGTH: Hamstrings: Right 80 deg; Left 80 deg Thomas test: Negative bilaterally  Ober's test: Positive   POSTURE: No Significant postural limitations  PALPATION: T3-T6 Central Spinous Process   LUMBAR ROM:   AROM eval  Flexion 100%  Extension 100%  Right lateral flexion 100%  Left lateral flexion 100%  Right rotation 100%*  Left rotation 100%*   (Blank rows = not tested)  LOWER EXTREMITY ROM:     Active  Right eval Left eval  Hip flexion    Hip extension    Hip  abduction    Hip adduction    Hip internal rotation    Hip external rotation    Knee flexion    Knee extension    Ankle dorsiflexion    Ankle plantarflexion    Ankle inversion    Ankle eversion     (Blank rows = not tested)  LOWER EXTREMITY MMT:    MMT Right eval Left eval  Hip flexion 4+ 4+  Hip extension 4 4  Hip abduction 4+ 4+  Hip adduction  4- 4-  Hip internal rotation    Hip external rotation    Knee flexion 4+ 4+  Knee extension 4+ 4+  Ankle dorsiflexion    Ankle plantarflexion    Ankle inversion    Ankle eversion     (Blank rows = not tested)  LUMBAR SPECIAL TESTS:  SI Compression/distraction test: Negative, FABER test: Negative, and FADIR Negaitve   FUNCTIONAL TESTS:  Squat: NT  Single Leg Stance: NT   GAIT: Distance walked: 50 ft  Assistive device utilized: None Level of assistance: Complete Independence Comments: No gait deficits noted   TODAY'S TREATMENT:                                                                                                                              DATE:   05/30/23: Side Lying IT band stretch 2 x 30 sec  Prone Quad Stretch 2 x 30 sec     PATIENT EDUCATION:  Education details: form and technique to perform correct exercise  Person educated: Patient Education method: Explanation, Demonstration, Verbal cues, and Handouts Education comprehension: verbalized understanding, returned demonstration, and verbal cues required  HOME EXERCISE PROGRAM: Access Code: [email or web address] URL: [email or web address] Date: 05/30/2023 Prepared by: Ellin Goodie  Exercises - Prone Quadriceps Stretch with Strap  - 1 x daily - 3 reps - 30 sec hold - Sidelying ITB Stretch off Table  - 1 x daily - 3 reps - 30 sec hold  ASSESSMENT:  CLINICAL IMPRESSION: Patient is a 52 y.o. AA female who was seen today for physical therapy evaluation and treatment for mid and low back pain. She shows symptoms that place her in the movement  control treatment based classification group with low to moderate pain and stable symptoms. She demonstrates decreased hip strength and flexibility and along with increased mid and low back pain with activity. She will benefit from skilled PT to address these aforementioned deficits to return to cleaning bathroom and vacuuming without pain or discomfort.    OBJECTIVE IMPAIRMENTS: decreased strength, hypomobility, and pain.   ACTIVITY LIMITATIONS: carrying, lifting, bending, standing, and squatting  PARTICIPATION LIMITATIONS: cleaning and community activity  PERSONAL FACTORS: 1-2 comorbidities: Fibromyalgia and Depression  are also affecting patient's functional outcome.   REHAB POTENTIAL: Good  CLINICAL DECISION MAKING: Stable/uncomplicated  EVALUATION COMPLEXITY: Low   GOALS: Goals reviewed with patient? No  SHORT TERM GOALS: Target date: 06/14/2023  Pt will be independent with HEP in order to improve strength and balance in order to decrease fall risk and improve function at home and work. Baseline: NT  Goal status: INITIAL    LONG TERM GOALS: Target date: 08/09/2023  Patient will have improved function and activity level as evidenced by an increase in FOTO score by 10 points or more.  Baseline: 48/100 with target of 65 Goal status: INITIAL  2.  Patient will improve hip, parascapular, and abdominal strength by 1/3 grade MMT ie (4- to 4) to have increased spinal  stability and improvement in mid and low back pain.  Baseline: Hip Ext R/L 4/4, Hip Adduction R/L 4-/4-,  Abdominal NT Goal status: INITIAL  3.  Pt will vacuum and clean bath tub without an increase in her low back and mid back pain >=5/10 NRPS as evidence of improve spinal function.  Baseline: NPRS  6/10 Goal status: INITIAL   PLAN:  PT FREQUENCY: 1-2x/week  PT DURATION: 10 weeks  PLANNED INTERVENTIONS: Therapeutic exercises, Neuromuscular re-education, Balance training, Gait training, Patient/Family education,  Self Care, Joint mobilization, Joint manipulation, Aquatic Therapy, Dry Needling, Electrical stimulation, Spinal manipulation, Spinal mobilization, Cryotherapy, Moist heat, Manual therapy, and Re-evaluation.  PLAN FOR NEXT SESSION: Sah[place]ann Abdominal Test, Hip IR and ER MMT, Parascapular testing: Mid Trap, Low Trap, and Shoulder Ext   Ellin Goodie PT, DPT  05/30/2023, 5:00 PM

## 2023-05-30 NOTE — Patient Instructions (Addendum)
May consider gabapentin or Paxil for hot flashes.  We could consider a very low-dose, short course of hormone replacement therapy which include estrogen and progesterone.  As discussed, permanent placement very carries a risk of increased risk of breast cancer, thrombotic events, stroke.  We certainly have to weigh the benefit versus risk.  You may trial over-the-counter black cohosh.    Start clotrimazole for suspected tinea.  Body Ringworm Body ringworm is an infection of the skin that often causes a ring-shaped rash. Body ringworm is also called tinea corporis. Body ringworm can affect any part of your skin. This condition is easily spread from person to person (is very contagious). What are the causes? This condition is caused by fungi called dermatophytes. The condition develops when these fungi grow out of control on the skin. You can get this condition if you touch a person or animal that has it. You can also get it if you share any items with an infected person or pet. These include: Clothing, bedding, and towels. Brushes or combs. Gym equipment. Any other object that has the fungus on it. What increases the risk? You are more likely to develop this condition if you: Play sports that involve close physical contact, such as wrestling. Sweat a lot. Live in areas that are hot and humid. Use public showers. Have a weakened disease-fighting system (immune system). What are the signs or symptoms? Symptoms of this condition include: Itchy, raised red spots and bumps. Red scaly patches. A ring-shaped rash. The rash may have: A clear center. Scales or red bumps at its center. Redness near its borders. Dry and scaly skin on or around it. How is this diagnosed? This condition can usually be diagnosed with a skin exam. A skin scraping may be taken from the affected area and examined under a microscope to see if the fungus is present. How is this treated? This condition may be  treated with: An antifungal cream or ointment. An antifungal shampoo. Antifungal medicines. These may be prescribed if your ringworm: Is severe. Keeps coming back or lasts a long time. Follow these instructions at home: Take over-the-counter and prescription medicines only as told by your health care provider. If you were given an antifungal cream or ointment: Use it as told by your health care provider. Wash the infected area and dry it completely before applying the cream or ointment. If you were given an antifungal shampoo: Use it as told by your health care provider. Leave the shampoo on your body for 3-5 minutes before rinsing. While you have a rash: Wear loose clothing to stop clothes from rubbing and irritating it. Wash or change your bed sheets every night. Wash clothes and bed sheets in hot water. Disinfect or throw out items that may be infected. Wash your hands often with soap and water for at least 20 seconds. If soap and water are not available, use hand sanitizer. If your pet has the same infection, take your pet to see a veterinarian for treatment. How is this prevented? Take a bath or shower every day and after every time you work out or play sports. Dry your skin completely after bathing. Wear sandals or shoes in public places and showers. Wash athletic clothes after each use. Do not share personal items with others. Avoid touching red patches of skin on other people. Avoid touching pets that have bald spots. If you touch an animal that has a bald spot, wash your hands. Contact a health care provider if: Your  rash continues to spread after 7 days of treatment. Your rash is not gone in 4 weeks. The area around your rash gets red, warm, tender, and swollen. This information is not intended to replace advice given to you by your health care provider. Make sure you discuss any questions you have with your health care provider. Document Revised: 05/11/2022 Document  Reviewed: 05/11/2022 Elsevier Patient Education  2024 [employer].

## 2023-05-30 NOTE — Progress Notes (Signed)
Assessment & Plan:  Class 2 obesity without serious comorbidity with body mass index (BMI) of 37.0 to 37.9 in adult, unspecified obesity type Assessment & Plan: Patient has started to gain weight.  Increase Zepbound to 7.5 mg  Orders: -     Zepbound; Inject 7.5 mg into the skin once a week.  Dispense: 2 mL; Refill: 3  Tinea corporis Assessment & Plan: Presentation consistent with tinea.  Start clotrimazole.  Counseled on keeping neck dry using cooling menopausal clothing  Orders: -     Clotrimazole; Apply 1 Application topically 2 (two) times daily.  Dispense: 30 g; Refill: 2  Hot flashes Assessment & Plan: Not particularly bothersome at this time.  We discussed gabapentin, Paxil, hormone replacement therapy.  Briefly discussed risk and benefit of hormone replacement therapy.  Patient like to trial natural measures at this time including cooling clothing or black cohosh.  She will let me know how she is doing      Return precautions given.   Risks, benefits, and alternatives of the medications and treatment plan prescribed today were discussed, and patient expressed understanding.   Education regarding symptom management and diagnosis given to patient on AVS either electronically or printed.  No follow-ups on file.  Rennie Plowman, FNP  Subjective:    Patient ID: Molly Sanchez, female    DOB: Jul 26, 1971, 52 y.o.   MRN: [id number]  CC: Molly Sanchez is a 52 y.o. female who presents today for an acute visit.    HPI: She has hot flashes and 'only sweats around nape of neck'.  Complains of itchy rash along anterior neck.  No papules.  No fever.  No relief with hydrocortisone.  Temporary relief with vicks vapor rub   She hasn't started new soap, lotions  Consult with Dr Smith Robert regarding history of antiphospholipid antibody syndrome.  Consult 05/16/2023. Patient therefore does not have antiphospholipid antibody syndrome presently and what she had was APS strictly  related to pregnancy.  No further follow-up with hematology.  Patient can have discussions in regards to harming replacement therapy  Uterus intact.  She would like to increase Zepbound.  She has started to gain weight.   Allergies: Patient has no known allergies. Current Outpatient Medications on File Prior to Visit  Medication Sig Dispense Refill   Cholecalciferol (VITAMIN D3) 1.25 MG (50000 UT) CAPS Take 1 capsule by mouth once a week 12 capsule 4   Cholecalciferol (VITAMIN D3) 1.25 MG (50000 UT) CAPS Take 1 capsule by mouth once a week. 12 capsule 4   COVID-19 At Home Antigen Test (CARESTART COVID-19 HOME TEST) KIT Use as directed within package instructions. 4 each 0   lisdexamfetamine (VYVANSE) 70 MG capsule Take 1 capsule (70 mg total) by mouth every morning. (fill 05/07/22) 90 capsule 0   metFORMIN (GLUCOPHAGE-XR) 500 MG 24 hr tablet Take 1 tablet (500 mg total) by mouth every evening. 90 tablet 3   vortioxetine HBr (TRINTELLIX) 10 MG TABS tablet Take 1 tablet (10 mg total) by mouth daily. 90 tablet 0   vortioxetine HBr (TRINTELLIX) 5 MG TABS tablet Take 1 tablet (5 mg total) by mouth daily. 90 tablet 2   Current Facility-Administered Medications on File Prior to Visit  Medication Dose Route Frequency Provider Last Rate Last Admin   0.9 %  sodium chloride infusion  500 mL Intravenous Continuous Pyrtle, Carie Caddy, MD        Review of Systems  Constitutional:  Negative for chills and fever.  Respiratory:  Negative for cough.   Cardiovascular:  Negative for chest pain and palpitations.  Gastrointestinal:  Negative for nausea and vomiting.  Skin:  Positive for rash.      Objective:    BP 130/76   Pulse 75   Temp (!) 97.5 F (36.4 C) (Oral)   Ht 5\' 7"  (1.702 m)   Wt 179 lb (81.2 kg)   SpO2 99%   BMI 28.04 kg/m   BP Readings from Last 3 Encounters:  05/30/23 130/76  05/16/23 124/78  05/15/23 124/82   Wt Readings from Last 3 Encounters:  05/30/23 179 lb (81.2 kg)   05/16/23 174 lb (78.9 kg)  05/15/23 175 lb (79.4 kg)    Physical Exam Vitals reviewed.  Constitutional:      Appearance: She is well-developed.  Eyes:     Conjunctiva/sclera: Conjunctivae normal.  Neck:      Comments: Diffuse, nonconfluent splotchy hypopigmentation noted under chin nape of neck.  No papules, vesicular lesions.  No increased warmth, red streaks Cardiovascular:     Rate and Rhythm: Normal rate and regular rhythm.     Pulses: Normal pulses.     Heart sounds: Normal heart sounds.  Pulmonary:     Effort: Pulmonary effort is normal.     Breath sounds: Normal breath sounds. No wheezing, rhonchi or rales.  Skin:    General: Skin is warm and dry.  Neurological:     Mental Status: She is alert.  Psychiatric:        Speech: Speech normal.        Behavior: Behavior normal.        Thought Content: Thought content normal.

## 2023-05-30 NOTE — Assessment & Plan Note (Signed)
Presentation consistent with tinea.  Start clotrimazole.  Counseled on keeping neck dry using cooling menopausal clothing

## 2023-05-31 ENCOUNTER — Other Ambulatory Visit (HOSPITAL_COMMUNITY): Payer: Self-pay

## 2023-06-05 ENCOUNTER — Telehealth: Payer: Self-pay

## 2023-06-05 ENCOUNTER — Ambulatory Visit: Payer: Commercial Managed Care - PPO | Admitting: Physical Therapy

## 2023-06-05 NOTE — Telephone Encounter (Signed)
Pt did not show for her scheduled PT visit. Called and left a HIPAA compliant VM regarding absence.   5:14 PM, 06/05/23 Rosamaria Lints, PT, DPT Physical Therapist - [facility] [phone] (Office)

## 2023-06-06 ENCOUNTER — Other Ambulatory Visit (HOSPITAL_COMMUNITY): Payer: Self-pay

## 2023-06-07 ENCOUNTER — Other Ambulatory Visit: Payer: Commercial Managed Care - PPO

## 2023-06-13 ENCOUNTER — Telehealth: Payer: Self-pay | Admitting: Physical Therapy

## 2023-06-13 ENCOUNTER — Ambulatory Visit: Payer: Commercial Managed Care - PPO | Attending: Family | Admitting: Physical Therapy

## 2023-06-13 DIAGNOSIS — M5459 Other low back pain: Secondary | ICD-10-CM | POA: Insufficient documentation

## 2023-06-13 DIAGNOSIS — M549 Dorsalgia, unspecified: Secondary | ICD-10-CM | POA: Insufficient documentation

## 2023-06-13 NOTE — Telephone Encounter (Signed)
Called to check in on pt because of absence. Pt forgot about apt but she will check future appointments.

## 2023-06-26 ENCOUNTER — Other Ambulatory Visit (HOSPITAL_COMMUNITY): Payer: Self-pay

## 2023-06-26 ENCOUNTER — Other Ambulatory Visit: Payer: Self-pay

## 2023-06-26 MED ORDER — LISDEXAMFETAMINE DIMESYLATE 70 MG PO CAPS
70.0000 mg | ORAL_CAPSULE | Freq: Every morning | ORAL | 0 refills | Status: AC
  Filled 2023-06-26: qty 90, 90d supply, fill #0

## 2023-06-27 ENCOUNTER — Ambulatory Visit: Payer: Commercial Managed Care - PPO | Admitting: Physical Therapy

## 2023-06-27 ENCOUNTER — Other Ambulatory Visit (HOSPITAL_COMMUNITY): Payer: Self-pay

## 2023-06-27 ENCOUNTER — Other Ambulatory Visit: Payer: Self-pay

## 2023-06-27 DIAGNOSIS — M549 Dorsalgia, unspecified: Secondary | ICD-10-CM

## 2023-06-27 DIAGNOSIS — M5459 Other low back pain: Secondary | ICD-10-CM

## 2023-06-27 MED ORDER — VITAMIN D3 1.25 MG (50000 UT) PO CAPS
1.0000 | ORAL_CAPSULE | ORAL | 1 refills | Status: DC
  Filled 2023-06-27: qty 12, 84d supply, fill #0
  Filled 2023-10-05: qty 12, 84d supply, fill #1

## 2023-06-27 NOTE — Therapy (Addendum)
OUTPATIENT PHYSICAL THERAPY THORACOLUMBAR DISCHARGE  Discharge of Summary: Pt has since missed three consecutive appointments without calling. Per policy, pt is now going to be discharge from care.   Patient Name: Molly Sanchez MRN: [id number] DOB:[birth year], 52 y.o., female Today's Date: 06/27/2023  END OF SESSION:  PT End of Session - 06/27/23 1737     Visit Number 2    Number of Visits 20    Date for PT Re-Evaluation 08/08/23    Authorization Type [employer] 2024    Authorization - Visit Number 2    Authorization - Number of Visits 20    Progress Note Due on Visit 10    PT Start Time 1730    PT Stop Time 1815    PT Time Calculation (min) 45 min    Activity Tolerance Patient tolerated treatment well    Behavior During Therapy [facility] for tasks assessed/performed             Past Medical History:  Diagnosis Date   ADD (attention deficit disorder)    ANA positive    Antiphospholipid antibody syndrome (HCC) 11/15/2012   Antiphospholipid antibody syndrome complicating pregnancy (HCC)    Back pain    Breast fibroadenoma    history of   Clotting disorder (HCC)    with pregancy   Depression    Dysplasia of cervix, low grade (CIN 1) [id number]   Fatigue    Fibromyalgia    GERD (gastroesophageal reflux disease)    H/O varicella    Hx of blood clots    During pregnancy   Joint pain    Raynaud's disease    Rosacea    Vitamin D deficiency    Past Surgical History:  Procedure Laterality Date   CESAREAN SECTION     GYNECOLOGIC CRYOSURGERY     of cervix   right breast fibroadenoma removed     Patient Active Problem List   Diagnosis Date Noted   Tinea corporis 05/30/2023   Hot flashes 05/30/2023   Vaginal discharge 05/15/2023   Conjunctival pigmentations 05/15/2023   Pelvic pain 05/12/2022   Constipation 05/12/2022   Mid back pain    Goals of care, counseling/discussion 03/22/2018   Insulin resistance 02/13/2017   Class 2 obesity without serious comorbidity with  body mass index (BMI) of 37.0 to 37.9 in adult 02/13/2017   Rheumatoid factor positive 07/07/2016   Routine general medical examination at a health care facility 02/17/2016   Annual physical exam 01/22/2016   GERD (gastroesophageal reflux disease) 01/22/2016   Weight gain 01/22/2016   ADHD (attention deficit hyperactivity disorder) 01/22/2016   Depression 01/22/2016   Antiphospholipid antibody syndrome (HCC) 11/15/2012   Dysplasia of cervix, low grade (CIN 1)    Rosacea    Fibromyalgia    ANA positive    Raynaud's disease     PCP: Rennie Plowman FNP   REFERRING PROVIDER: Rennie Plowman FNP   REFERRING DIAG: M54.9 (ICD-10-CM) - Mid back pain  Rationale for Evaluation and Treatment: Rehabilitation  THERAPY DIAG:  Other low back pain  Mid back pain  ONSET DATE: 12/29/22  SUBJECTIVE:  SUBJECTIVE STATEMENT: Pt states that she has been feeling good as of late with minor aches and pains.   PERTINENT HISTORY:  Pt reports increased mid and lower back pain especially with increased activity like playing basketball or doing choirs. The mid back pain started 6 months ago and he last major flare was 6 weeks ago after playing basketball with her children. She mostly works at [employer] and she experiences more pain at the end of a work day.   PAIN:  Are you having pain? Yes: NPRS scale: 1/10 Pain location: T6  Pain description: Achy  Aggravating factors: physical activity  Relieving factors: Tylenol   PRECAUTIONS: None  WEIGHT BEARING RESTRICTIONS: No  FALLS:  Has patient fallen in last 6 months? No  LIVING ENVIRONMENT: Lives with: lives with their family Lives in: House/apartment Stairs: Yes: Internal: 13 steps; on right going up Has following equipment at home: None  OCCUPATION:  Research officer, political party for medical office   PLOF: Independent  PATIENT GOALS: Pt wants to ease her pain and increase her flexibility and start strength training without increasing her pain level.   NEXT MD VISIT: Nothing scheduled   OBJECTIVE:   VITALS: BP 116/72 HR 88 SpO2 100   DIAGNOSTIC FINDINGS:  CLINICAL DATA:  Mid back pain.   EXAM: THORACIC SPINE - 3 VIEWS   COMPARISON:  None Available.   FINDINGS: Mild multilevel degenerative disc disease with tiny anterior osteophytes. No loss of disc height. No fracture or malalignment.   IMPRESSION: Mild multilevel degenerative disc disease with tiny anterior osteophytes. No loss of disc height.     Electronically Signed   By: Gerome Sam III M.D.   On: 04/16/2023 08:31 /////////////////////////////////////////////////////////////////////////////////  CLINICAL DATA:  Pain   EXAM: LUMBAR SPINE - COMPLETE 4+ VIEW   COMPARISON:  None Available.   FINDINGS: Trace retrolisthesis of L3 versus L4 and L5 versus S1. No other malalignment. No fractures. Mild degenerative disc disease most prominent at L1-2 and L5-S1. Partial loss of disc height at L5-S1. Mild lumbar facet degenerative changes suspected. No other abnormalities.   IMPRESSION: Degenerative changes as above.     Electronically Signed   By: Gerome Sam III M.D.   On: 04/16/2023 08:33  PATIENT SURVEYS:  FOTO 48/100  SCREENING FOR RED FLAGS: Bowel or bladder incontinence: No Spinal tumors: No Cauda equina syndrome: No Compression fracture: No Abdominal aneurysm: No  COGNITION: Overall cognitive status: Within functional limits for tasks assessed     SENSATION: WFL  MUSCLE LENGTH: Hamstrings: Right 80 deg; Left 80 deg Thomas test: Negative bilaterally  Ober's test: Positive   POSTURE: No Significant postural limitations  PALPATION: T3-T6 Central Spinous Process   LUMBAR ROM:   AROM eval  Flexion 100%  Extension 100%  Right  lateral flexion 100%  Left lateral flexion 100%  Right rotation 100%*  Left rotation 100%*   (Blank rows = not tested)  LOWER EXTREMITY ROM:     Active  Right eval Left eval  Hip flexion    Hip extension    Hip abduction    Hip adduction    Hip internal rotation    Hip external rotation    Knee flexion    Knee extension    Ankle dorsiflexion    Ankle plantarflexion    Ankle inversion    Ankle eversion     (Blank rows = not tested)  LOWER EXTREMITY MMT:    MMT Right eval Left eval  Hip flexion 4+ 4+  Hip extension 4 4  Hip abduction 4+ 4+  Hip adduction 4- 4-  Hip internal rotation    Hip external rotation    Knee flexion 4+ 4+  Knee extension 4+ 4+  Ankle dorsiflexion    Ankle plantarflexion    Ankle inversion    Ankle eversion     (Blank rows = not tested)  LUMBAR SPECIAL TESTS:  SI Compression/distraction test: Negative, FABER test: Negative, and FADIR Negaitve   FUNCTIONAL TESTS:  Squat: NT  Single Leg Stance: NT   GAIT: Distance walked: 50 ft  Assistive device utilized: None Level of assistance: Complete Independence Comments: No gait deficits noted   TODAY'S TREATMENT:                                                                                                                              DATE:   06/27/23: Sahrmann: Level 4  Side Lying Thoracic Rotation 2 x 10  Parascapular MMT: Lower Trap R/L 4-/4-  Mid Trap R/L 4/4 Shoulder Ext R/L 4/4   Hip MMT: IR R/L  4+/4+ ER R/L 4+/4+   OMEGA Seated Rows #15 2 x 10  OMEGA Seated Rows #20 1 x 10  D2 Shoulder Flexion/Extension with yellow band 1 x 10  -min VC to decrease sped Sequential Leg March Lower 2 x 10  -min VC to stop holding breath  -pt unable to complete 10 reps without needing a break  Sequential Leg March Lower 2 x 5   05/30/23: Side Lying IT band stretch 2 x 30 sec  Prone Quad Stretch 2 x 30 sec     PATIENT EDUCATION:  Education details: form and technique to perform  correct exercise  Person educated: Patient Education method: Explanation, Demonstration, Verbal cues, and Handouts Education comprehension: verbalized understanding, returned demonstration, and verbal cues required  HOME EXERCISE PROGRAM: Access Code: [email or web address] URL: [email or web address] Date: 06/27/2023 Prepared by: Ellin Goodie  Exercises - Prone Quadriceps Stretch with Strap  - 1 x daily - 3 reps - 30 sec hold - Sidelying ITB Stretch off Table  - 1 x daily - 3 reps - 30 sec hold - Sidelying Thoracic Rotation with Open Book  - 1 x daily - 2 sets - 10 reps - Standing Shoulder Single Arm PNF D2 Flexion with Resistance  - 3-4 x weekly - 3 sets - 10 reps - Hooklying Sequential Leg March and Lower  - 3-4 x weekly - 3 sets - 5 reps  ASSESSMENT:  CLINICAL IMPRESSION:           Pt presents for follow up treatment for mid thoracic and low back pain. She does show decreased parascapular strength. She was able to complete all exercises without an increase in her mid or lower back pain and she was able to demonstrate understanding of exercises. Pt has since missed three consecutive appointments and she is now being discharged from care per clinic's policy. She has not met  all of her rehab goals at this point.   OBJECTIVE IMPAIRMENTS: decreased strength, hypomobility, and pain.   ACTIVITY LIMITATIONS: carrying, lifting, bending, standing, and squatting  PARTICIPATION LIMITATIONS: cleaning and community activity  PERSONAL FACTORS: 1-2 comorbidities: Fibromyalgia and Depression  are also affecting patient's functional outcome.   REHAB POTENTIAL: Good  CLINICAL DECISION MAKING: Stable/uncomplicated  EVALUATION COMPLEXITY: Low   GOALS: Goals reviewed with patient? No  SHORT TERM GOALS: Target date: 06/14/2023  Pt will be independent with HEP in order to improve strength and balance in order to decrease fall risk and improve function at home and work. Baseline: Did not  perform HEP yet. 06/27/23: Able to perform independently  Goal status: MET    LONG TERM GOALS: Target date: 08/09/2023  Patient will have improved function and activity level as evidenced by an increase in FOTO score by 10 points or more.  Baseline: 48/100 with target of 65 Goal status: NOT MET   2.  Patient will improve hip, parascapular, and abdominal strength by 1/3 grade MMT ie (4- to 4) to have increased spinal stability and improvement in mid and low back pain.  Baseline: Hip Ext R/L 4/4, Hip Adduction R/L 4-/4-,  Abdominal NT Goal status: NOT MET   3.  Pt will vacuum and clean bath tub without an increase in her low back and mid back pain >=5/10 NRPS as evidence of improve spinal function.  Baseline: NPRS  6/10 Goal status: NOT MET    PLAN:  PT FREQUENCY: 1-2x/week  PT DURATION: 10 weeks  PLANNED INTERVENTIONS: Therapeutic exercises, Neuromuscular re-education, Balance training, Gait training, Patient/Family education, Self Care, Joint mobilization, Joint manipulation, Aquatic Therapy, Dry Needling, Electrical stimulation, Spinal manipulation, Spinal mobilization, Cryotherapy, Moist heat, Manual therapy, and Re-evaluation.  PLAN FOR NEXT SESSION:  Continue to progress abdominal, parascapular, and hip strength: Mini-Squats, standing scapular rows.   Ellin Goodie PT, DPT  06/27/2023, 5:38 PM

## 2023-07-04 ENCOUNTER — Ambulatory Visit: Payer: Commercial Managed Care - PPO | Admitting: Physical Therapy

## 2023-07-04 ENCOUNTER — Telehealth: Payer: Self-pay | Admitting: Physical Therapy

## 2023-07-04 NOTE — Telephone Encounter (Signed)
Unable to reach pt but left VM instructing pt to call back to reschedule and reminding her of missed apt time.

## 2023-07-11 ENCOUNTER — Ambulatory Visit: Payer: Commercial Managed Care - PPO | Admitting: Physical Therapy

## 2023-07-11 ENCOUNTER — Telehealth: Payer: Self-pay | Admitting: Physical Therapy

## 2023-07-11 NOTE — Telephone Encounter (Signed)
Called pt to inquire about absence from PT. Pt reports that she is unable to make any of her morning appointments. PT responded by finding evening appointments throughout August and scheduling pt in these slots.

## 2023-07-16 ENCOUNTER — Other Ambulatory Visit (HOSPITAL_COMMUNITY): Payer: Self-pay

## 2023-07-16 ENCOUNTER — Ambulatory Visit: Payer: Commercial Managed Care - PPO | Admitting: Physical Therapy

## 2023-07-17 ENCOUNTER — Other Ambulatory Visit: Payer: Self-pay

## 2023-07-19 ENCOUNTER — Ambulatory Visit: Payer: Commercial Managed Care - PPO | Attending: Family | Admitting: Physical Therapy

## 2023-07-19 DIAGNOSIS — M5459 Other low back pain: Secondary | ICD-10-CM | POA: Insufficient documentation

## 2023-07-19 DIAGNOSIS — M549 Dorsalgia, unspecified: Secondary | ICD-10-CM | POA: Insufficient documentation

## 2023-07-20 ENCOUNTER — Other Ambulatory Visit: Payer: Self-pay

## 2023-07-24 ENCOUNTER — Other Ambulatory Visit: Payer: Self-pay | Admitting: Family

## 2023-07-24 ENCOUNTER — Encounter: Payer: Self-pay | Admitting: Family

## 2023-07-24 ENCOUNTER — Other Ambulatory Visit (HOSPITAL_COMMUNITY): Payer: Self-pay

## 2023-07-24 DIAGNOSIS — E88819 Insulin resistance, unspecified: Secondary | ICD-10-CM

## 2023-07-24 MED ORDER — METFORMIN HCL ER 500 MG PO TB24
1000.0000 mg | ORAL_TABLET | Freq: Every evening | ORAL | 3 refills | Status: DC
Start: 2023-07-24 — End: 2024-07-25
  Filled 2023-07-24 – 2023-07-26 (×2): qty 180, 90d supply, fill #0
  Filled 2023-10-27: qty 180, 90d supply, fill #1
  Filled 2024-01-05: qty 180, 90d supply, fill #2
  Filled 2024-04-22: qty 180, 90d supply, fill #3

## 2023-07-25 ENCOUNTER — Encounter: Payer: Commercial Managed Care - PPO | Admitting: Physical Therapy

## 2023-07-26 ENCOUNTER — Other Ambulatory Visit (HOSPITAL_COMMUNITY): Payer: Self-pay

## 2023-07-31 ENCOUNTER — Ambulatory Visit: Payer: Commercial Managed Care - PPO | Admitting: Physical Therapy

## 2023-08-01 ENCOUNTER — Encounter: Payer: Commercial Managed Care - PPO | Admitting: Physical Therapy

## 2023-08-01 NOTE — Therapy (Signed)
Note entered in error

## 2023-08-06 ENCOUNTER — Encounter: Payer: Commercial Managed Care - PPO | Admitting: Physical Therapy

## 2023-09-01 ENCOUNTER — Other Ambulatory Visit (HOSPITAL_COMMUNITY): Payer: Self-pay

## 2023-09-28 ENCOUNTER — Other Ambulatory Visit (HOSPITAL_COMMUNITY): Payer: Self-pay

## 2023-10-01 ENCOUNTER — Other Ambulatory Visit (HOSPITAL_COMMUNITY): Payer: Self-pay

## 2023-10-01 MED ORDER — LISDEXAMFETAMINE DIMESYLATE 70 MG PO CAPS
70.0000 mg | ORAL_CAPSULE | Freq: Every morning | ORAL | 0 refills | Status: AC
  Filled 2023-10-01: qty 90, 90d supply, fill #0

## 2023-10-05 ENCOUNTER — Other Ambulatory Visit (HOSPITAL_COMMUNITY): Payer: Self-pay

## 2023-10-05 MED ORDER — TRINTELLIX 5 MG PO TABS
15.0000 mg | ORAL_TABLET | Freq: Every day | ORAL | 0 refills | Status: DC
  Filled 2023-10-05 – 2023-10-12 (×3): qty 270, 90d supply, fill #0
  Filled 2023-10-25: qty 90, 30d supply, fill #0
  Filled 2023-11-09 – 2024-01-05 (×2): qty 90, 30d supply, fill #1
  Filled 2024-03-31: qty 90, 30d supply, fill #2

## 2023-10-12 ENCOUNTER — Other Ambulatory Visit (HOSPITAL_COMMUNITY): Payer: Self-pay

## 2023-10-12 MED ORDER — TRINTELLIX 10 MG PO TABS
10.0000 mg | ORAL_TABLET | Freq: Every day | ORAL | 0 refills | Status: DC
  Filled 2023-10-12: qty 30, 30d supply, fill #0
  Filled 2023-10-25: qty 90, 90d supply, fill #0

## 2023-10-25 ENCOUNTER — Other Ambulatory Visit (HOSPITAL_COMMUNITY): Payer: Self-pay

## 2023-10-25 ENCOUNTER — Other Ambulatory Visit: Payer: Self-pay

## 2023-10-27 ENCOUNTER — Other Ambulatory Visit (HOSPITAL_COMMUNITY): Payer: Self-pay

## 2023-11-09 ENCOUNTER — Other Ambulatory Visit (HOSPITAL_COMMUNITY): Payer: Self-pay

## 2023-11-19 ENCOUNTER — Other Ambulatory Visit (HOSPITAL_COMMUNITY): Payer: Self-pay

## 2023-12-19 ENCOUNTER — Other Ambulatory Visit: Payer: Self-pay | Admitting: Family

## 2023-12-19 DIAGNOSIS — Z1231 Encounter for screening mammogram for malignant neoplasm of breast: Secondary | ICD-10-CM

## 2024-01-01 DIAGNOSIS — Z1231 Encounter for screening mammogram for malignant neoplasm of breast: Secondary | ICD-10-CM

## 2024-01-05 ENCOUNTER — Other Ambulatory Visit (HOSPITAL_COMMUNITY): Payer: Self-pay

## 2024-01-05 ENCOUNTER — Other Ambulatory Visit (HOSPITAL_BASED_OUTPATIENT_CLINIC_OR_DEPARTMENT_OTHER): Payer: Self-pay

## 2024-01-07 ENCOUNTER — Other Ambulatory Visit (HOSPITAL_COMMUNITY): Payer: Self-pay

## 2024-01-07 ENCOUNTER — Other Ambulatory Visit: Payer: Self-pay

## 2024-01-12 ENCOUNTER — Other Ambulatory Visit (HOSPITAL_COMMUNITY): Payer: Self-pay

## 2024-01-21 ENCOUNTER — Other Ambulatory Visit (HOSPITAL_COMMUNITY): Payer: Self-pay

## 2024-01-21 DIAGNOSIS — F331 Major depressive disorder, recurrent, moderate: Secondary | ICD-10-CM | POA: Diagnosis not present

## 2024-01-21 DIAGNOSIS — F900 Attention-deficit hyperactivity disorder, predominantly inattentive type: Secondary | ICD-10-CM | POA: Diagnosis not present

## 2024-01-21 MED ORDER — VITAMIN D3 1.25 MG (50000 UT) PO CAPS
50000.0000 [IU] | ORAL_CAPSULE | ORAL | 3 refills | Status: AC
  Filled 2024-01-21: qty 12, 84d supply, fill #0
  Filled 2024-04-22: qty 12, 84d supply, fill #1

## 2024-01-21 MED ORDER — LISDEXAMFETAMINE DIMESYLATE 70 MG PO CAPS
70.0000 mg | ORAL_CAPSULE | Freq: Every morning | ORAL | 0 refills | Status: DC
  Filled 2024-01-21: qty 90, 90d supply, fill #0

## 2024-01-31 ENCOUNTER — Ambulatory Visit: Payer: Commercial Managed Care - PPO

## 2024-02-12 ENCOUNTER — Ambulatory Visit: Payer: Commercial Managed Care - PPO

## 2024-02-13 ENCOUNTER — Other Ambulatory Visit (HOSPITAL_COMMUNITY): Payer: Self-pay

## 2024-02-13 MED ORDER — TRETINOIN 0.025 % EX CREA
TOPICAL_CREAM | Freq: Every day | CUTANEOUS | 0 refills | Status: AC
  Filled 2024-02-13: qty 45, 30d supply, fill #0

## 2024-02-19 ENCOUNTER — Other Ambulatory Visit (HOSPITAL_COMMUNITY): Payer: Self-pay

## 2024-02-21 ENCOUNTER — Ambulatory Visit
Admission: RE | Admit: 2024-02-21 | Discharge: 2024-02-21 | Disposition: A | Discharge status: Home / Self Care | Source: Ambulatory Visit | Attending: Family | Admitting: Family

## 2024-02-21 DIAGNOSIS — Z1231 Encounter for screening mammogram for malignant neoplasm of breast: Secondary | ICD-10-CM | POA: Diagnosis not present

## 2024-02-23 ENCOUNTER — Other Ambulatory Visit (HOSPITAL_COMMUNITY): Payer: Self-pay

## 2024-02-25 ENCOUNTER — Other Ambulatory Visit (HOSPITAL_COMMUNITY): Payer: Self-pay

## 2024-03-11 ENCOUNTER — Other Ambulatory Visit (HOSPITAL_COMMUNITY): Payer: Self-pay

## 2024-03-11 DIAGNOSIS — Z01411 Encounter for gynecological examination (general) (routine) with abnormal findings: Secondary | ICD-10-CM | POA: Diagnosis not present

## 2024-03-11 DIAGNOSIS — N898 Other specified noninflammatory disorders of vagina: Secondary | ICD-10-CM | POA: Diagnosis not present

## 2024-03-11 DIAGNOSIS — Z113 Encounter for screening for infections with a predominantly sexual mode of transmission: Secondary | ICD-10-CM | POA: Diagnosis not present

## 2024-03-11 DIAGNOSIS — Z01419 Encounter for gynecological examination (general) (routine) without abnormal findings: Secondary | ICD-10-CM | POA: Diagnosis not present

## 2024-03-11 DIAGNOSIS — Z1331 Encounter for screening for depression: Secondary | ICD-10-CM | POA: Diagnosis not present

## 2024-03-11 DIAGNOSIS — Z124 Encounter for screening for malignant neoplasm of cervix: Secondary | ICD-10-CM | POA: Diagnosis not present

## 2024-03-11 MED ORDER — ESTRADIOL 10 MCG VA TABS
ORAL_TABLET | VAGINAL | 11 refills | Status: AC
  Filled 2024-03-11: qty 18, 28d supply, fill #0
  Filled 2024-03-31: qty 40, 90d supply, fill #1
  Filled 2024-04-02: qty 24, 49d supply, fill #1
  Filled 2024-09-05 – 2024-09-07 (×2): qty 24, 49d supply, fill #2

## 2024-04-01 ENCOUNTER — Other Ambulatory Visit: Payer: Self-pay

## 2024-04-01 ENCOUNTER — Other Ambulatory Visit (HOSPITAL_COMMUNITY): Payer: Self-pay

## 2024-04-02 ENCOUNTER — Other Ambulatory Visit (HOSPITAL_COMMUNITY): Payer: Self-pay

## 2024-04-22 ENCOUNTER — Other Ambulatory Visit (HOSPITAL_COMMUNITY): Payer: Self-pay

## 2024-04-22 MED ORDER — LISDEXAMFETAMINE DIMESYLATE 70 MG PO CAPS
70.0000 mg | ORAL_CAPSULE | Freq: Every morning | ORAL | 0 refills | Status: AC
Start: 2024-04-22 — End: ?
  Filled 2024-04-22: qty 90, 90d supply, fill #0

## 2024-04-23 ENCOUNTER — Other Ambulatory Visit: Payer: Self-pay

## 2024-05-13 ENCOUNTER — Other Ambulatory Visit (HOSPITAL_COMMUNITY): Payer: Self-pay

## 2024-05-16 ENCOUNTER — Other Ambulatory Visit (HOSPITAL_COMMUNITY): Payer: Self-pay

## 2024-05-21 ENCOUNTER — Other Ambulatory Visit (HOSPITAL_COMMUNITY): Payer: Self-pay

## 2024-05-21 MED ORDER — TRINTELLIX 5 MG PO TABS
15.0000 mg | ORAL_TABLET | Freq: Every day | ORAL | 0 refills | Status: DC
  Filled 2024-05-21: qty 270, 90d supply, fill #0
  Filled 2024-06-14: qty 90, 30d supply, fill #0
  Filled 2024-07-25: qty 90, 30d supply, fill #1
  Filled 2024-09-05: qty 15, 5d supply, fill #2
  Filled 2024-09-07: qty 75, 25d supply, fill #2

## 2024-05-22 ENCOUNTER — Other Ambulatory Visit: Payer: Self-pay

## 2024-05-22 ENCOUNTER — Other Ambulatory Visit (HOSPITAL_COMMUNITY): Payer: Self-pay

## 2024-05-31 ENCOUNTER — Other Ambulatory Visit (HOSPITAL_COMMUNITY): Payer: Self-pay

## 2024-06-14 ENCOUNTER — Other Ambulatory Visit (HOSPITAL_COMMUNITY): Payer: Self-pay

## 2024-07-15 DIAGNOSIS — F900 Attention-deficit hyperactivity disorder, predominantly inattentive type: Secondary | ICD-10-CM | POA: Diagnosis not present

## 2024-07-15 DIAGNOSIS — F3342 Major depressive disorder, recurrent, in full remission: Secondary | ICD-10-CM | POA: Diagnosis not present

## 2024-07-16 ENCOUNTER — Other Ambulatory Visit (HOSPITAL_COMMUNITY): Payer: Self-pay

## 2024-07-16 MED ORDER — LISDEXAMFETAMINE DIMESYLATE 70 MG PO CAPS
70.0000 mg | ORAL_CAPSULE | Freq: Every morning | ORAL | 0 refills | Status: AC
  Filled 2024-07-25: qty 90, 90d supply, fill #0

## 2024-07-24 ENCOUNTER — Encounter: Payer: Self-pay | Admitting: Family

## 2024-07-24 ENCOUNTER — Ambulatory Visit (INDEPENDENT_AMBULATORY_CARE_PROVIDER_SITE_OTHER): Admitting: Family

## 2024-07-24 VITALS — BP 128/72 | HR 60 | Temp 97.8°F | Ht 67.0 in | Wt 192.2 lb

## 2024-07-24 DIAGNOSIS — Z1211 Encounter for screening for malignant neoplasm of colon: Secondary | ICD-10-CM | POA: Diagnosis not present

## 2024-07-24 DIAGNOSIS — Z0000 Encounter for general adult medical examination without abnormal findings: Secondary | ICD-10-CM | POA: Diagnosis not present

## 2024-07-24 DIAGNOSIS — Z8249 Family history of ischemic heart disease and other diseases of the circulatory system: Secondary | ICD-10-CM | POA: Diagnosis not present

## 2024-07-24 DIAGNOSIS — E669 Obesity, unspecified: Secondary | ICD-10-CM

## 2024-07-24 LAB — HEMOGLOBIN A1C: Hgb A1c MFr Bld: 5.4 % (ref 4.6–6.5)

## 2024-07-24 LAB — COMPREHENSIVE METABOLIC PANEL WITH GFR
ALT: 8 U/L (ref 0–35)
AST: 15 U/L (ref 0–37)
Albumin: 4.1 g/dL (ref 3.5–5.2)
Alkaline Phosphatase: 59 U/L (ref 39–117)
BUN: 15 mg/dL (ref 6–23)
CO2: 31 meq/L (ref 19–32)
Calcium: 9.1 mg/dL (ref 8.4–10.5)
Chloride: 103 meq/L (ref 96–112)
Creatinine, Ser: 0.76 mg/dL (ref 0.40–1.20)
GFR: 90.05 mL/min (ref >60.00)
Glucose, Bld: 81 mg/dL (ref 70–99)
Potassium: 4.4 meq/L (ref 3.5–5.1)
Sodium: 140 meq/L (ref 135–145)
Total Bilirubin: 1.1 mg/dL (ref 0.2–1.2)
Total Protein: 6.8 g/dL (ref 6.0–8.3)

## 2024-07-24 LAB — CBC WITH DIFFERENTIAL/PLATELET
Basophils Absolute: 0 K/uL (ref 0.0–0.1)
Basophils Relative: 0.7 % (ref 0.0–3.0)
Eosinophils Absolute: 0.1 K/uL (ref 0.0–0.7)
Eosinophils Relative: 1.6 % (ref 0.0–5.0)
HCT: 41.7 % (ref 36.0–46.0)
Hemoglobin: 13.8 g/dL (ref 12.0–15.0)
Lymphocytes Relative: 27.1 % (ref 12.0–46.0)
Lymphs Abs: 1.4 K/uL (ref 0.7–4.0)
MCHC: 33 g/dL (ref 30.0–36.0)
MCV: 91.4 fl (ref 78.0–100.0)
Monocytes Absolute: 0.5 K/uL (ref 0.1–1.0)
Monocytes Relative: 10.3 % (ref 3.0–12.0)
Neutro Abs: 3.2 K/uL (ref 1.4–7.7)
Neutrophils Relative %: 60.3 % (ref 43.0–77.0)
Platelets: 245 K/uL (ref 150.0–400.0)
RBC: 4.56 Mil/uL (ref 3.87–5.11)
RDW: 14 % (ref 11.5–15.5)
WBC: 5.2 K/uL (ref 4.0–10.5)

## 2024-07-24 LAB — LIPID PANEL
Cholesterol: 220 mg/dL — ABNORMAL HIGH (ref 0–200)
HDL: 83 mg/dL (ref >39.00)
LDL Cholesterol: 126 mg/dL — ABNORMAL HIGH (ref 0–99)
NonHDL: 136.78
Total CHOL/HDL Ratio: 3
Triglycerides: 55 mg/dL (ref 0.0–149.0)
VLDL: 11 mg/dL (ref 0.0–40.0)

## 2024-07-24 LAB — VITAMIN D 25 HYDROXY (VIT D DEFICIENCY, FRACTURES): VITD: 87.34 ng/mL (ref 30.00–100.00)

## 2024-07-24 LAB — TSH: TSH: 1.83 u[IU]/mL (ref 0.35–5.50)

## 2024-07-24 NOTE — Assessment & Plan Note (Addendum)
 Congratulated patient on diligence to lifestyle.  We discussed inconsistent use with Zepbound .  Advised her to stay consistent weekly with Zepbound  7.5 mg and aim for no more than 1 to 2 pounds weight loss per week.  Discussed appropriate weight around 180 to 85 pounds, depending on how she feels.  Close follow-up

## 2024-07-24 NOTE — Patient Instructions (Signed)
 Start zepbound     If you would like to further stratify your cardiovascular risk or consider medication management, I would first recommend obtaining a CT calcium score.  Information included below.  Please let me know if you would like to order and I will place.    Once I have ordered, you may schedule on your own by calling [phone] ( [facility] [address] in [place] ).   An estimate of cost is $150-200 out-of-pocket as not covered by insurance.    Below an article from [facility] regarding the test.   [email or web address]#:~:[email or web address]%[email or web address]%20CT%20calcium%20score,arteries%20can%20cause%20heart%20attacks.   Exams We Offer: Cardiac CT Calcium Score  Knowing your score could save your life. A cardiac CT calcium score, also known as a coronary calcium scan, is a quick, convenient and noninvasive way of evaluating the amount of calcified (hard) plaque in your heart vessels. The level of calcium equates to the extent of plaque build-up in your arteries. Plaque in the arteries can cause heart attacks.  The radiologist reads the images and sends your doctor a report with a calcium score. Patients with higher scores have a greater risk for a heart attack, heart disease or stroke. Knowing your score can help your doctor decide on blood pressure and cholesterol goals that will minimize your risk as much as possible.  The [employer] of Cardiology found that Coronary artery calcification (CAC) is an excellent cardiovascular disease risk marker and can help guide the decision to use cholesterol reducing medications such as statins. A negative calcium score may reduce the need for statins in otherwise eligible patients.  The exam takes less than 10 minutes, is painless and does not require any IV or oral contrast.  Who should get a Cardiac CT Calcium Score: Middle age adults at intermediate risk of heart  disease Family history of heart disease Borderline high cholesterol, high blood pressure or diabetes Overweight or physical inactivity Uncertain about taking daily preventive medical therapy

## 2024-07-24 NOTE — Assessment & Plan Note (Addendum)
 Deferred clinical breast exam as patient is following with GYN.  Deferred pelvic exam as patient is had a Pap smear this year, requesting this report.  She has follow-up tomorrow postmenopausal bleeding. Referral for colonoscopy.  Family history of ASCVD, I ordered CT calcium score for further risk stratification

## 2024-07-24 NOTE — Progress Notes (Signed)
 Assessment & Plan:  Routine general medical examination at a health care facility Assessment & Plan: Deferred clinical breast exam as patient is following with GYN.  Deferred pelvic exam as patient is had a Pap smear this year, requesting this report.  She has follow-up tomorrow postmenopausal bleeding. Referral for colonoscopy.  Family history of ASCVD, I ordered CT calcium score for further risk stratification  Orders: -     Ambulatory referral to Gastroenterology -     VITAMIN D 25 Hydroxy (Vit-D Deficiency, Fractures) -     Hemoglobin A1c -     CBC with Differential/Platelet -     Comprehensive metabolic panel with GFR -     Lipid panel -     TSH -     Insulin, random  Family history of ASCVD -     Lipid panel -     CT CARDIAC SCORING (SELF PAY ONLY); Future  Screen for colon cancer -     Ambulatory referral to Gastroenterology  Obesity, unspecified class, unspecified obesity type, unspecified whether serious comorbidity present Assessment & Plan: Congratulated patient on diligence to lifestyle.  We discussed inconsistent use with Zepbound.  Advised her to stay consistent weekly with Zepbound 7.5 mg and aim for no more than 1 to 2 pounds weight loss per week.  Discussed appropriate weight around 180 to 85 pounds, depending on how she feels.  Close follow-up      Return precautions given.   Risks, benefits, and alternatives of the medications and treatment plan prescribed today were discussed, and patient expressed understanding.   Education regarding symptom management and diagnosis given to patient on AVS either electronically or printed.  No follow-ups on file.  Molly Northern, FNP  Subjective:    Patient ID: Molly Sanchez, female    DOB: Jul 26, 1971, 53 y.o.   MRN: [id number]  CC: Molly Sanchez is a 53 y.o. female who presents today for physical exam.    HPI: HPI Discussed the use of AI scribe software for clinical note transcription with the  patient, who gave verbal consent to proceed.  History of Present Illness Molly Sanchez is a 53 year old female who presents with postmenopausal bleeding.  She has been experiencing postmenopausal bleeding after 13 months without a period. She has follow up with GYN tomorrow.   She is currently using Zepbound at a dose of 7.5 mg inconsistently, approximately every three weeks. She has noticed weight gain since her last visit, which she attributes to inconsistent medication use and lack of exercise. Denies N, constipation.   Her family history is significant for cardiovascular events, with her mother having a stroke at 83 and a heart attack at 49, and her maternal grandfather dying of a heart attack at 16. She is concerned about her family history of heart disease.  She is currently taking metformin, although she is not diabetic. She is busy with work, school, and family responsibilities, including being a mother to teenagers and a grandmother to a 48-month-old granddaughter.   Colorectal Cancer Screening: Due, 08/22/2017 , repeat in 5 years, Dr Lovenia  Breast Cancer Screening: Mammogram UTD, 3/25 Cervical Cancer Screening: UTD, 05/12/2021 NILM, neg HPV; she follows with GYN , last seen 03/11/24 . She reports pap smear 03/11/24 , Dr Gorge.        Tetanus - UTD        Pneumococcal - Candidate for. Declines   Alcohol use:  none Smoking/tobacco use: Nonsmoker.    Health  Maintenance  Topic Date Due   Hepatitis B Vaccine (1 of 3 - 19+ 3-dose series) Never done   Zoster (Shingles) Vaccine (1 of 2) Never done   COVID-19 Vaccine (3 - Pfizer risk series) 02/13/2020   Pneumococcal Vaccine for age over 37 (1 of 1 - PCV) Never done   Colon Cancer Screening  08/22/2022   Flu Shot  07/11/2024   Mammogram  02/20/2026   Pap with HPV screening  05/13/2027   DTaP/Tdap/Td vaccine (3 - Td or Tdap) 05/12/2032   Hepatitis C Screening  Completed   HIV Screening  Completed   HPV Vaccine  Aged Out    Meningitis B Vaccine  Aged Out    ALLERGIES: Patient has no known allergies.  Current Outpatient Medications on File Prior to Visit  Medication Sig Dispense Refill   Cholecalciferol  (VITAMIN D3) 1.25 MG (50000 UT) CAPS Take 1 capsule by mouth once a week 12 capsule 4   Cholecalciferol  (VITAMIN D3) 1.25 MG (50000 UT) CAPS Take 1 capsule (1.25 mg total) by mouth once a week. 12 capsule 1   Cholecalciferol  (VITAMIN D3) 1.25 MG (50000 UT) CAPS Take 1 capsule (50,000 Units total) by mouth once a week. 12 capsule 3   clotrimazole  (LOTRIMIN ) 1 % cream Apply 1 Application topically 2 (two) times daily. 30 g 2   COVID-19 At Home Antigen Test (CARESTART COVID-19 HOME TEST) KIT Use as directed within package instructions. 4 each 0   Estradiol  (YUVAFEM ) 10 MCG TABS vaginal tablet Insert 1 tablet vaginally once daily for 14 days then insert 1 tablet twice weekly as directed 18 tablet 11   lisdexamfetamine (VYVANSE ) 70 MG capsule Take 1 capsule (70 mg total) by mouth every morning. 90 capsule 0   lisdexamfetamine (VYVANSE ) 70 MG capsule Take 1 capsule (70 mg total) by mouth every morning. 90 capsule 0   lisdexamfetamine (VYVANSE ) 70 MG capsule Take 1 capsule (70 mg total) by mouth in the morning. 90 capsule 0   lisdexamfetamine (VYVANSE ) 70 MG capsule Take 1 capsule (70 mg total) by mouth every morning. 90 capsule 0   metFORMIN  (GLUCOPHAGE -XR) 500 MG 24 hr tablet Take 2 tablets by mouth every evening. 180 tablet 3   tirzepatide  (ZEPBOUND ) 7.5 MG/0.5ML Pen Inject 7.5 mg into the skin once a week. 2 mL 3   tretinoin  (RETIN-A ) 0.025 % cream Apply pea sized amount to face topically at bedtime. 45 g 0   vortioxetine  HBr (TRINTELLIX ) 10 MG TABS tablet Take 1 tablet (10 mg total) by mouth daily. 90 tablet 0   vortioxetine  HBr (TRINTELLIX ) 5 MG TABS tablet Take 3 tablets (15 mg total) by mouth daily. 270 tablet 0   Current Facility-Administered Medications on File Prior to Visit  Medication Dose Route Frequency  Provider Last Rate Last Admin   0.9 %  sodium chloride  infusion  500 mL Intravenous Continuous Pyrtle, Gordy HERO, MD        Review of Systems  Constitutional:  Negative for chills, fever and unexpected weight change.  HENT:  Negative for congestion.   Respiratory:  Negative for cough.   Cardiovascular:  Negative for chest pain, palpitations and leg swelling.  Gastrointestinal:  Negative for nausea and vomiting.  Genitourinary:  Positive for vaginal bleeding.  Musculoskeletal:  Negative for arthralgias and myalgias.  Skin:  Negative for rash.  Neurological:  Negative for headaches.  Hematological:  Negative for adenopathy.  Psychiatric/Behavioral:  Negative for confusion.       Objective:  BP 128/72   Pulse 60   Temp 97.8 F (36.6 C) (Oral)   Ht 5' 7 (1.702 m)   Wt 192 lb 3.2 oz (87.2 kg)   LMP  (LMP Unknown)   SpO2 99%   BMI 30.10 kg/m   BP Readings from Last 3 Encounters:  07/24/24 128/72  05/30/23 130/76  05/16/23 124/78   Wt Readings from Last 3 Encounters:  07/24/24 192 lb 3.2 oz (87.2 kg)  05/30/23 179 lb (81.2 kg)  05/16/23 174 lb (78.9 kg)    Physical Exam Vitals reviewed.  Constitutional:      Appearance: She is well-developed.  Eyes:     Conjunctiva/sclera: Conjunctivae normal.  Neck:     Thyroid : No thyroid  mass or thyromegaly.  Cardiovascular:     Rate and Rhythm: Normal rate and regular rhythm.     Pulses: Normal pulses.     Heart sounds: Normal heart sounds.  Pulmonary:     Effort: Pulmonary effort is normal.     Breath sounds: Normal breath sounds. No wheezing, rhonchi or rales.  Lymphadenopathy:     Head:     Right side of head: No submental, submandibular, tonsillar, preauricular, posterior auricular or occipital adenopathy.     Left side of head: No submental, submandibular, tonsillar, preauricular, posterior auricular or occipital adenopathy.     Cervical: No cervical adenopathy.  Skin:    General: Skin is warm and dry.  Neurological:      Mental Status: She is alert.  Psychiatric:        Speech: Speech normal.        Behavior: Behavior normal.        Thought Content: Thought content normal.

## 2024-07-25 ENCOUNTER — Other Ambulatory Visit: Payer: Self-pay | Admitting: Family

## 2024-07-25 ENCOUNTER — Other Ambulatory Visit (HOSPITAL_COMMUNITY): Payer: Self-pay

## 2024-07-25 DIAGNOSIS — E88819 Insulin resistance, unspecified: Secondary | ICD-10-CM

## 2024-07-25 DIAGNOSIS — Z6837 Body mass index (BMI) 37.0-37.9, adult: Secondary | ICD-10-CM

## 2024-07-25 LAB — INSULIN, RANDOM: Insulin: 3.7 u[IU]/mL

## 2024-07-25 MED ORDER — ZEPBOUND 7.5 MG/0.5ML ~~LOC~~ SOAJ
7.5000 mg | SUBCUTANEOUS | 3 refills | Status: AC
  Filled 2024-07-25: qty 2, 28d supply, fill #0

## 2024-07-25 MED ORDER — METFORMIN HCL ER 500 MG PO TB24
1000.0000 mg | ORAL_TABLET | Freq: Every evening | ORAL | 3 refills | Status: AC
  Filled 2024-07-25: qty 180, 90d supply, fill #0
  Filled 2024-10-29: qty 180, 90d supply, fill #1

## 2024-07-30 ENCOUNTER — Ambulatory Visit
Admission: RE | Admit: 2024-07-30 | Discharge: 2024-07-30 | Disposition: A | Discharge status: Home / Self Care | Payer: Self-pay | Source: Ambulatory Visit | Attending: Family | Admitting: Family

## 2024-07-30 DIAGNOSIS — Z8249 Family history of ischemic heart disease and other diseases of the circulatory system: Secondary | ICD-10-CM | POA: Insufficient documentation

## 2024-07-31 ENCOUNTER — Other Ambulatory Visit (HOSPITAL_COMMUNITY): Payer: Self-pay

## 2024-07-31 DIAGNOSIS — N950 Postmenopausal bleeding: Secondary | ICD-10-CM | POA: Diagnosis not present

## 2024-07-31 DIAGNOSIS — R61 Generalized hyperhidrosis: Secondary | ICD-10-CM | POA: Diagnosis not present

## 2024-07-31 DIAGNOSIS — L292 Pruritus vulvae: Secondary | ICD-10-CM | POA: Diagnosis not present

## 2024-07-31 MED ORDER — NYSTATIN-TRIAMCINOLONE 100000-0.1 UNIT/GM-% EX OINT
TOPICAL_OINTMENT | Freq: Two times a day (BID) | CUTANEOUS | 1 refills | Status: AC
  Filled 2024-07-31: qty 30, 10d supply, fill #0
  Filled 2024-09-05: qty 30, 30d supply, fill #0
  Filled 2024-10-29: qty 30, 30d supply, fill #1

## 2024-08-07 ENCOUNTER — Encounter: Admitting: Family

## 2024-08-07 ENCOUNTER — Ambulatory Visit: Payer: Self-pay | Admitting: Family

## 2024-08-07 DIAGNOSIS — E785 Hyperlipidemia, unspecified: Secondary | ICD-10-CM | POA: Insufficient documentation

## 2024-08-12 ENCOUNTER — Other Ambulatory Visit (HOSPITAL_COMMUNITY): Payer: Self-pay

## 2024-09-06 ENCOUNTER — Other Ambulatory Visit (HOSPITAL_COMMUNITY): Payer: Self-pay

## 2024-09-06 ENCOUNTER — Other Ambulatory Visit: Payer: Self-pay

## 2024-09-07 ENCOUNTER — Other Ambulatory Visit (HOSPITAL_COMMUNITY): Payer: Self-pay

## 2024-09-08 ENCOUNTER — Other Ambulatory Visit: Payer: Self-pay

## 2024-10-03 ENCOUNTER — Other Ambulatory Visit (HOSPITAL_COMMUNITY): Payer: Self-pay

## 2024-10-03 MED ORDER — VORTIOXETINE HBR 5 MG PO TABS
15.0000 mg | ORAL_TABLET | Freq: Every day | ORAL | 0 refills | Status: DC
  Filled 2024-10-03: qty 270, 90d supply, fill #0

## 2024-10-07 ENCOUNTER — Other Ambulatory Visit (HOSPITAL_COMMUNITY): Payer: Self-pay

## 2024-10-21 ENCOUNTER — Encounter: Payer: Self-pay | Admitting: Internal Medicine

## 2024-10-29 ENCOUNTER — Other Ambulatory Visit (HOSPITAL_COMMUNITY): Payer: Self-pay

## 2024-10-29 ENCOUNTER — Other Ambulatory Visit: Payer: Self-pay

## 2024-10-30 ENCOUNTER — Other Ambulatory Visit (HOSPITAL_COMMUNITY): Payer: Self-pay

## 2024-10-31 ENCOUNTER — Other Ambulatory Visit (HOSPITAL_COMMUNITY): Payer: Self-pay

## 2024-10-31 MED ORDER — LISDEXAMFETAMINE DIMESYLATE 70 MG PO CAPS
70.0000 mg | ORAL_CAPSULE | Freq: Every morning | ORAL | 0 refills | Status: AC
  Filled 2024-10-31: qty 90, 90d supply, fill #0

## 2024-11-01 ENCOUNTER — Other Ambulatory Visit (HOSPITAL_COMMUNITY): Payer: Self-pay

## 2025-01-05 ENCOUNTER — Other Ambulatory Visit (HOSPITAL_COMMUNITY): Payer: Self-pay

## 2025-01-07 ENCOUNTER — Other Ambulatory Visit (HOSPITAL_COMMUNITY): Payer: Self-pay

## 2025-01-07 MED ORDER — TRINTELLIX 5 MG PO TABS
15.0000 mg | ORAL_TABLET | Freq: Every day | ORAL | 0 refills | Status: AC
  Filled 2025-01-07: qty 270, 90d supply, fill #0

## 2025-01-07 MED ORDER — VITAMIN D3 1.25 MG (50000 UT) PO CAPS
50000.0000 [IU] | ORAL_CAPSULE | ORAL | 0 refills | Status: AC
  Filled 2025-01-07: qty 12, 84d supply, fill #0

## 2025-01-08 ENCOUNTER — Other Ambulatory Visit (HOSPITAL_COMMUNITY): Payer: Self-pay

## 2025-01-15 ENCOUNTER — Other Ambulatory Visit (HOSPITAL_COMMUNITY): Payer: Self-pay

## 2025-07-29 ENCOUNTER — Encounter: Admitting: Family
